# Patient Record
Sex: Female | Born: 1955 | Race: White | Hispanic: No | Marital: Single | Smoking: Never smoker
Health system: Southern US, Community
[De-identification: ages and names within clinical notes are randomized; demographics above are authoritative.]

## PROBLEM LIST (undated history)

## (undated) DIAGNOSIS — C4490 Unspecified malignant neoplasm of skin, unspecified: Secondary | ICD-10-CM

## (undated) DIAGNOSIS — G4700 Insomnia, unspecified: Secondary | ICD-10-CM

## (undated) DIAGNOSIS — K5792 Diverticulitis of intestine, part unspecified, without perforation or abscess without bleeding: Secondary | ICD-10-CM

## (undated) DIAGNOSIS — K219 Gastro-esophageal reflux disease without esophagitis: Secondary | ICD-10-CM

## (undated) DIAGNOSIS — J45909 Unspecified asthma, uncomplicated: Secondary | ICD-10-CM

## (undated) DIAGNOSIS — M1990 Unspecified osteoarthritis, unspecified site: Secondary | ICD-10-CM

## (undated) DIAGNOSIS — E7800 Pure hypercholesterolemia, unspecified: Secondary | ICD-10-CM

## (undated) DIAGNOSIS — R0600 Dyspnea, unspecified: Secondary | ICD-10-CM

## (undated) DIAGNOSIS — F32A Depression, unspecified: Secondary | ICD-10-CM

## (undated) DIAGNOSIS — R7303 Prediabetes: Secondary | ICD-10-CM

## (undated) DIAGNOSIS — E119 Type 2 diabetes mellitus without complications: Secondary | ICD-10-CM

## (undated) DIAGNOSIS — I10 Essential (primary) hypertension: Secondary | ICD-10-CM

## (undated) HISTORY — DX: Essential (primary) hypertension: I10

## (undated) HISTORY — PX: ABDOMINAL HYSTERECTOMY: SHX81

## (undated) HISTORY — PX: VESICOVAGINAL FISTULA CLOSURE W/ TAH: SUR271

## (undated) HISTORY — DX: Type 2 diabetes mellitus without complications: E11.9

## (undated) HISTORY — DX: Insomnia, unspecified: G47.00

## (undated) HISTORY — PX: CHOLECYSTECTOMY: SHX55

## (undated) HISTORY — DX: Unspecified malignant neoplasm of skin, unspecified: C44.90

---
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
## 1983-08-09 HISTORY — PX: ABDOMINAL HYSTERECTOMY: SHX81

## 1996-08-08 HISTORY — PX: APPENDECTOMY: SHX54

## 1996-08-08 HISTORY — PX: CHOLECYSTECTOMY: SHX55

## 1997-12-05 ENCOUNTER — Ambulatory Visit (HOSPITAL_COMMUNITY): Admission: RE | Admit: 1997-12-05 | Discharge: 1997-12-05 | Payer: Self-pay | Admitting: Obstetrics and Gynecology

## 1998-02-10 ENCOUNTER — Inpatient Hospital Stay (HOSPITAL_COMMUNITY): Admission: RE | Admit: 1998-02-10 | Discharge: 1998-02-12 | Payer: Self-pay | Admitting: Obstetrics and Gynecology

## 1998-08-08 HISTORY — PX: BREAST REDUCTION SURGERY: SHX8

## 1998-12-18 ENCOUNTER — Ambulatory Visit (HOSPITAL_COMMUNITY): Admission: RE | Admit: 1998-12-18 | Discharge: 1998-12-18 | Payer: Self-pay | Admitting: *Deleted

## 1999-10-03 ENCOUNTER — Encounter: Payer: Self-pay | Admitting: Emergency Medicine

## 1999-10-03 ENCOUNTER — Emergency Department (HOSPITAL_COMMUNITY): Admission: EM | Admit: 1999-10-03 | Discharge: 1999-10-03 | Payer: Self-pay | Admitting: Emergency Medicine

## 2001-09-20 ENCOUNTER — Encounter: Admission: RE | Admit: 2001-09-20 | Discharge: 2001-09-20 | Payer: Self-pay | Admitting: Dermatology

## 2001-09-20 ENCOUNTER — Encounter: Payer: Self-pay | Admitting: Dermatology

## 2001-09-20 ENCOUNTER — Other Ambulatory Visit: Admission: RE | Admit: 2001-09-20 | Discharge: 2001-09-20 | Payer: Self-pay | Admitting: Obstetrics and Gynecology

## 2002-02-02 ENCOUNTER — Encounter: Payer: Self-pay | Admitting: Emergency Medicine

## 2002-02-02 ENCOUNTER — Observation Stay (HOSPITAL_COMMUNITY): Admission: EM | Admit: 2002-02-02 | Discharge: 2002-02-03 | Payer: Self-pay | Admitting: Emergency Medicine

## 2002-02-02 ENCOUNTER — Encounter (INDEPENDENT_AMBULATORY_CARE_PROVIDER_SITE_OTHER): Payer: Self-pay | Admitting: Specialist

## 2002-10-11 ENCOUNTER — Other Ambulatory Visit: Admission: RE | Admit: 2002-10-11 | Discharge: 2002-10-11 | Payer: Self-pay | Admitting: Obstetrics and Gynecology

## 2003-11-18 ENCOUNTER — Other Ambulatory Visit: Admission: RE | Admit: 2003-11-18 | Discharge: 2003-11-18 | Payer: Self-pay | Admitting: Obstetrics and Gynecology

## 2003-11-21 ENCOUNTER — Emergency Department (HOSPITAL_COMMUNITY): Admission: EM | Admit: 2003-11-21 | Discharge: 2003-11-21 | Payer: Self-pay | Admitting: Emergency Medicine

## 2004-06-23 ENCOUNTER — Ambulatory Visit: Payer: Self-pay | Admitting: Gastroenterology

## 2004-07-12 ENCOUNTER — Ambulatory Visit: Payer: Self-pay | Admitting: Gastroenterology

## 2004-08-10 ENCOUNTER — Ambulatory Visit: Payer: Self-pay | Admitting: Gastroenterology

## 2004-09-21 ENCOUNTER — Ambulatory Visit: Payer: Self-pay | Admitting: Gastroenterology

## 2005-02-03 ENCOUNTER — Encounter: Admission: RE | Admit: 2005-02-03 | Discharge: 2005-02-03 | Payer: Self-pay | Admitting: Unknown Physician Specialty

## 2005-03-10 ENCOUNTER — Ambulatory Visit: Payer: Self-pay | Admitting: Gastroenterology

## 2005-05-09 ENCOUNTER — Inpatient Hospital Stay (HOSPITAL_COMMUNITY): Admission: RE | Admit: 2005-05-09 | Discharge: 2005-05-10 | Payer: Self-pay | Admitting: Neurosurgery

## 2007-05-09 ENCOUNTER — Ambulatory Visit (HOSPITAL_COMMUNITY): Admission: RE | Admit: 2007-05-09 | Discharge: 2007-05-09 | Payer: Self-pay | Admitting: Gastroenterology

## 2007-05-11 ENCOUNTER — Ambulatory Visit (HOSPITAL_COMMUNITY): Admission: RE | Admit: 2007-05-11 | Discharge: 2007-05-11 | Payer: Self-pay | Admitting: Gastroenterology

## 2007-07-27 ENCOUNTER — Encounter (INDEPENDENT_AMBULATORY_CARE_PROVIDER_SITE_OTHER): Payer: Self-pay | Admitting: General Surgery

## 2007-07-27 ENCOUNTER — Ambulatory Visit (HOSPITAL_COMMUNITY): Admission: RE | Admit: 2007-07-27 | Discharge: 2007-07-28 | Payer: Self-pay | Admitting: General Surgery

## 2009-08-19 ENCOUNTER — Emergency Department (HOSPITAL_COMMUNITY): Admission: EM | Admit: 2009-08-19 | Discharge: 2009-08-20 | Payer: Self-pay | Admitting: Emergency Medicine

## 2010-10-24 LAB — URINALYSIS, ROUTINE W REFLEX MICROSCOPIC
Ketones, ur: NEGATIVE mg/dL
Nitrite: NEGATIVE
Urobilinogen, UA: 0.2 mg/dL (ref 0.0–1.0)

## 2010-10-24 LAB — POCT I-STAT, CHEM 8
Calcium, Ion: 1.16 mmol/L (ref 1.12–1.32)
Chloride: 106 mEq/L (ref 96–112)
Potassium: 3.5 mEq/L (ref 3.5–5.1)
Sodium: 136 mEq/L (ref 135–145)
TCO2: 23 mmol/L (ref 0–100)

## 2010-10-24 LAB — DIFFERENTIAL
Basophils Absolute: 0 10*3/uL (ref 0.0–0.1)
Eosinophils Absolute: 0.1 10*3/uL (ref 0.0–0.7)
Lymphs Abs: 2.3 10*3/uL (ref 0.7–4.0)
Monocytes Absolute: 1 10*3/uL (ref 0.1–1.0)
Monocytes Relative: 9 % (ref 3–12)
Neutro Abs: 7.5 10*3/uL (ref 1.7–7.7)

## 2010-10-24 LAB — CBC: RBC: 4.58 MIL/uL (ref 3.87–5.11)

## 2010-12-21 NOTE — Op Note (Signed)
[name], Traci Hudson       ACCOUNT NO.:  [id number]   MEDICAL RECORD NO.:  [id number]          PATIENT TYPE:  OIB   LOCATION:  1335                         FACILITY:  [facility]   PHYSICIAN:  Adolph Pollack, M.D.DATE OF BIRTH:  25-Apr-1956   DATE OF PROCEDURE:  07/27/2007  DATE OF DISCHARGE:                               OPERATIVE REPORT   PREOPERATIVE DIAGNOSIS:  Biliary dyskinesia.   POSTOPERATIVE DIAGNOSIS:  Biliary dyskinesia.   PROCEDURE:  Laparoscopic cholecystectomy with intraoperative  cholangiogram.   SURGEON:  Adolph Pollack, M.D.   ANESTHESIA:  General.   ASSISTANT:  Lorne Skeens. Hoxworth, M.D.   INDICATIONS:  This is a 55 year old female who has been having some  nausea, bloating, and reflux after eating.  She has been treated  aggressively for this has had had an extensive evaluation.  She has been  noted to have biliary dyskinesia.  She has tried some medical treatment  with respect to anti-reflux medications and continues to have symptoms.  We discussed laparoscopic cholecystectomy for this and she now presents  for that.   TECHNIQUE:  She was seen in the holding area and then brought to the  operating room, placed supine on the operating table, and a general  anesthetic was administered.  Her abdominal wall was sterilely prepped  and draped.  Marcaine solution was infiltrated in the subumbilical  region.  A small subumbilical incision was made through the skin and  subcutaneous tissue.  An incision was made in the anterior and posterior  fascial layers and the peritoneal cavity was entered under direct  vision.  A pursestring suture of 0 Vicryl was placed around the fascial  edges.  A Hassan trocar was introduced into the peritoneal cavity and  pneumoperitoneum created by insufflation of CO2 gas.   Following this, a laparoscope was introduced.  No significant hepatic  abnormalities were noted.  She was placed in reversed Trendelenburg  position  with the right side tilted slightly up.  An 11 mm trocar was  placed through an epigastric incision and two 5 mm trocars were placed  in the right upper quadrant.  The fundus of the gallbladder was grasped  and adhesions between the omentum and body and infundibulum of the  gallbladder were noted and mobilized bluntly.  The infundibulum was  grasped and mobilized using blunt dissection.  It was retracted  laterally.  I identified the cystic duct and created a window around it.  Just adjacent to it was the cystic artery and I created a window around  the artery and clipped and divided it.  A clip was placed at the cystic  duct gallbladder junction.  A small incision was made into the cystic  duct.  A cholangiocatheter was passed through the anterior abdominal  wall and placed into the cystic duct and a cholangiogram was performed.   Under real time fluoroscopy, dilute contrast was injected into the  cystic duct which was of moderate length.  The right and left hepatic  ducts as well as the common hepatic duct and bile duct all opacified  promptly.  Contrast drained promptly into the duodenum without obvious  evidence of obstruction.  Final report is pending the radiologist  interpretation.   Following this, the cholangiocatheter was removed, the cystic duct was  clipped three times proximally and divided.  The gallbladder dissected  free from the liver using electrocautery and placed in Endopouch bag.  The gallbladder fossa was copiously irrigated and bleeding points were  controlled with electrocautery.  Further inspection demonstrated no be  bleeding or bile leak.  The gallbladder was removed through the  subumbilical port.  The subumbilical fascial defect was closed under  laparoscopic vision by tightening up and tying down the pursestring  suture.  The remaining irrigation fluid was evacuated as much as  possible and the rest of the trocars were removed and the  pneumoperitoneum was  released.   The skin incisions were then closed using 4-0 Monocryl subcuticular  stitches followed by Steri-Strips and sterile dressings.  She tolerated  the procedure without any apparent complications and was taken to the  recovery room in satisfactory condition.      Adolph Pollack, M.D.  Electronically Signed     TJR/MEDQ  D:  07/27/2007  T:  07/28/2007  Job:  [id number]   cc:   Anselmo Rod, M.D.  Fax: [phone]   Edwena Felty. Romine, M.D.  Fax: [phone]

## 2010-12-24 NOTE — Op Note (Signed)
NAMETONILYNN, BIEKER              ACCOUNT NO.:  [id number]   MEDICAL RECORD NO.:  [id number]          PATIENT TYPE:  INP   LOCATION:  2859                         FACILITY:  [facility]   PHYSICIAN:  Clydene Fake, M.D.  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   POSTOPERATIVE DIAGNOSIS:  Spondylosis with left-sided radiculopathy C4-5, 5-  6 and 6-7.   PROCEDURE:  Anterior cervical decompression, discectomy and fusion at C4-5,  5-6 and 6-7 with LifeNet allograft bone, Eagle anterior cervical plate.   SURGEON:  Clydene Fake, M.D.   ASSISTANT:  Hilda Lias, M.D.   ANESTHESIA:  General endotracheal tube anesthesia .   ESTIMATED BLOOD LOSS:  Minimal.   BLOOD REPLACED:  None.   DRAINS:  None.   COMPLICATIONS:  None.   INDICATIONS FOR PROCEDURE:  Patient is a 55 year old woman who was having  neck, left shoulder and arm pain and numbness with spondylitic changes on  MRI at three levels, worse at the left side from kneeling.  Patient brought  in for decompression and fusion.   DESCRIPTION OF PROCEDURE:  Patient was brought to the operating room.  General anesthesia induced.  Patient placed in 10 pound Halter traction,  prepped and draped in sterile fashion.  Site of incision was injected with  10 mL of 1% lidocaine with epinephrine.  Incision was then made from the  midline.  The anterior border of the sternocleidomastoid muscle on the left  side of the neck incision taken down to the platysma and hemostasis obtained  with Bovie cauterization.  Platysma was incised with the Bovie and blunt  dissection was taken through the anterior cervical fascia of the anterior  cervical spine.  The needle was placed in the interspace.  X-ray was  obtained showing this was the 5-6 interspace.  This interspace was incised  with  a 15 blade and partial discectomy was performed.  The needle was  removed.  The longus colli muscles were reflected laterally from C4-7  bilaterally and self-retaining retractor was placed so we could see the 4-5  and 5-6 interspaces.  Both interspaces were incised with the 15 blade and  discectomy was started with pituitary rongeurs.  Distraction pins were  placed in the C4 and C6 and interspaces distracted.  Microscope was brought  in for microdissection  Discectomy in 4-5 was continued with the pituitary  rongeurs and curettes and then 1 and 2 mm Kerrison punches were used to  remove posterior ligament, positive osteophytes, decompressing the central  canal and then performing bilateral foraminotomies.  When we were finished,  we had good central and lateral decompression.  Nerve roots to both sides  were decompressed.  We measured the height of the interspace to be 5 mm and  we had hemostasis with Gelfoam and thrombin.  Attention was then taken to  the 5-6 level.  This was very spondylitic.  The __________ spurs were bit  off  with [employer].  Disc space did not open up well and high speed  drill was used to drill down through the interspace removing her last end  plate and disc space.  We drilled down leaving a thin layer of posterior  ligament and osteophyte.  A 2 mm Kerrison punch was then used to remove this  decompression down to the dura centrally and then perform bilateral  foraminotomies, decompressing the nerve roots to both sides.  When we were  finished, we had good decompression of the central canal and the bilateral  C6 nerve roots.  Interspace was distracted with the interspace distractor  while we were doing the decompression.  We measured the height of the disc  space to be 4 mm and got hemostasis with Gelfoam and thrombin form the 4-5  level and tapped the 5 mm  LifeNet bone in place, countersinking it a couple  of millimeters and checking posterior of the graft  and there was plenty of  room between the bone graft and dura.  We repeated this at the 5-6 level  with the 4 mm bone graft tapping it into place after the Gelfoam was  removed.  We irrigated with antibiotic solution.  There was good hemostasis  at these levels.  Distraction was removed and the distraction pin was  removed from C4 and placed in the C7.  Retractors were removed down so we  could see the 6-7 interspace and we incised the disc space and distracting  the interspace.  We started discectomy with pituitary rongeurs and curettes  drilled off the anterior osteophytes with the high speed drill and then  drilled through the interspace with the high speed drill.  We then continued  the discectomy with curettes, pituitary rongeurs and then used 1 and 2 mm  Kerrison punches to remove posterior disc, posterior ligament and posterior  osteophytes, decompressing the central canal and then performing bilateral  foraminotomies.  When we were finished, we could seen the roots coming out,  we had good decompression over the roots on each side.  Interspace was  distracted and we measured the height of the interspace to be 5 mm.  After  good hemostasis with Gelfoam and thrombin, this was irrigated out.  We  tapped a 5 mm bone graft into place, countersinking it a couple of  millimeters and checking posterior graft, insuring there was plenty of room  between bone graft and dura.  Distraction pins removed.  Hemostasis obtained  with Gelfoam and thrombin.  Weights removed from the traction.  We had good  position of all the bone grafts and were firmly in place.  Osteophytes were  trimmed with Leksell rongeurs and __________ anterior cervical plate was  placed over the anterior cervical spine and two screws placed in the C7, two  in C4 and two in C5 and 6 also.  These were all tightened down.  We checked  the bone grafts and they were firmly in place and tightened the screws. Took a lateral C-spine  x-ray which showed good position of the plate, screws  and interbody bone plugs at C4-5, 5-6 and 6-7.  Retractors removed.  Hemostasis obtained with Gelfoam, thrombin and bipolar cauterization.  Gelfoam was irrigated out.  We had very good hemostasis and the platysma was  closed with 3-0 Vicryl interrupted sutures.  The subcutaneous tissue was  closed with the same.  Skin was closed with benzoin and Steri-Strips.  Dressing was placed.  Patient was awoken  from anesthesia, extubated and  placed in a soft cervical collar and transferred to the recovery room in  stable condition.           ______________________________  Clydene Fake, M.D.     JRH/MEDQ  D:  05/09/2005  T:  05/09/2005  Job:  [id number]

## 2010-12-24 NOTE — Op Note (Signed)
[facility]  Patient:    Traci Hudson, Traci Hudson Visit Number: [id number] MRN: [id number]          Service Type: SUR Location: 4W [id number] [id number] Attending Physician:  Henrene Dodge Dictated by:   Anselm Pancoast. Zachery Dakins, M.D. Proc. Date: 02/02/02 Admit Date:  02/02/2002 Discharge Date: 02/03/2002                             Operative Report  PREOPERATIVE DIAGNOSIS:  Acute appendicitis.  POSTOPERATIVE DIAGNOSIS:  Acute appendicitis.  OPERATION:  Laparoscopic appendectomy.  ANESTHESIA:  General.  SURGEON:  Anselm Pancoast. Zachery Dakins, M.D.  ASSISTANT:  Sandria Bales. Ezzard Standing, M.D.  HISTORY:  The patient is a 55 year old Caucasian female who started having abdominal pain yesterday approximately midday. It progressed through the evening and this morning early a.m. she presented to the emergency room at [facility]. She was seen by the ER physician. White count was mildly elevated and she was definitely more tender in the lower abdomen. I was asked to see her after he had ordered a CT. Her urinalysis was negative and when I examined her, she had taken about two-thirds of the contrast and I was in agreement that this was probably an appendicitis. She gives a history that she has had problems with diverticulosis or actually has a lot of cramping and bloating symptoms and had a previous colonoscopy that showed diverticulosis. I thought it would be best to go ahead and proceed with the CT, which was performed and this showed inflammation around the cecum. We could not definitely see the appendix, but we could see what appeared to be a fecalith in the appendix. The patient preoperatively is given 3 g of Unasyn. Permission obtained for a laparoscopic appendectomy and the possibility of having to do it open if it was truly retrocecal was discussed with her.  PROCEDURE IN DETAIL:  The patient was taken to the operating suite. Induction of general anesthesia endotracheal  tube, oral tube into the stomach, Foley catheter inserted sterilely, and she received the antibiotic. The patient was draped in a sterile manner and then positioned so that I could make a small incision below the umbilicus, through the approximately two inches of adipose tissue, the fascia was identified and picked up between hemostats and a small opening carefully made into the peritoneal cavity. Traction sutures were placed and a Hasson cannula introduced. The camera was inserted and you could see a definite inflammatory process in the right lower quadrant. I placed a right upper 5 mm trocar under direct vision and then we kind of reflected the cecum down. You could see an obviously inflamed appendix, not truly retrocecal but kind of lying under the cecum but not retroperitoneally. The lateral 10 mm ______ trocar was placed at the left lower abdomen in direct vision and then with this, Dr. Ezzard Standing had scrubbed in. We were able to kind of free up the appendix, reflecting it upwards then using the harmonic scalpel, cauterized the appendiceal mesentery and taken it back down to the base. The appendix itself was inflamed, pretty close to the actual junction of the cecum but there was about a half-inch little area of normal base of the appendix. This was freed up so I could take a [facility] Endo clamp and then fired it so that the appendix at its junction with the cecum. I then placed the inflamed appendix in an Endocatch bag and then brought it out through  the umbilical port. The umbilical port was replaced and then we inspected the suture line with good hemostasis. I also removed the lower 10 mm port on direct vision, aspirated the irrigating fluid, and then removed the upper 5 mm port. The gas was released and then the pursestring suture at the umbilicus was tied with direct vision. I anesthetized the fascia at the umbilicus and then closed the subcutaneous tissue with 4-0 Vicryl, and Benzoin and  Steri-Strips on the skin. The patient tolerated the procedure nicely. She will be able to resume her diet this afternoon, then she will ready for discharge in the a.m. Dictated by:   Anselm Pancoast. Zachery Dakins, M.D. Attending Physician:  Henrene Dodge DD:  02/02/02 TD:  02/04/02 Job: [id number]

## 2011-05-13 LAB — COMPREHENSIVE METABOLIC PANEL
ALT: 25
Albumin: 3.8
Chloride: 104
GFR calc Af Amer: >60
GFR calc non Af Amer: >60
Potassium: 3.9
Total Bilirubin: 0.8

## 2011-05-13 LAB — CBC
HCT: 39.1
Hemoglobin: 13.3
MCHC: 34
MCV: 78.8
Platelets: 324
RBC: 4.97
WBC: 7.1

## 2011-05-13 LAB — DIFFERENTIAL
Basophils Absolute: 0
Basophils Relative: 0
Lymphocytes Relative: 30
Monocytes Relative: 6
Neutrophils Relative %: 62

## 2012-06-13 ENCOUNTER — Inpatient Hospital Stay (HOSPITAL_COMMUNITY): Admission: RE | Admit: 2012-06-13 | Payer: Self-pay | Source: Ambulatory Visit | Admitting: Orthopedic Surgery

## 2012-06-13 ENCOUNTER — Encounter (HOSPITAL_COMMUNITY): Admission: RE | Payer: Self-pay | Source: Ambulatory Visit

## 2012-06-13 SURGERY — ARTHROPLASTY, KNEE, TOTAL
Anesthesia: General | Site: Knee | Laterality: Left

## 2012-08-02 ENCOUNTER — Emergency Department (HOSPITAL_COMMUNITY)
Admission: EM | Admit: 2012-08-02 | Discharge: 2012-08-02 | Disposition: A | Discharge status: Home / Self Care | Payer: 59 | Attending: Emergency Medicine | Admitting: Emergency Medicine

## 2012-08-02 ENCOUNTER — Encounter (HOSPITAL_COMMUNITY): Payer: Self-pay | Admitting: *Deleted

## 2012-08-02 DIAGNOSIS — E7800 Pure hypercholesterolemia, unspecified: Secondary | ICD-10-CM | POA: Insufficient documentation

## 2012-08-02 DIAGNOSIS — R05 Cough: Secondary | ICD-10-CM | POA: Insufficient documentation

## 2012-08-02 DIAGNOSIS — B9789 Other viral agents as the cause of diseases classified elsewhere: Secondary | ICD-10-CM | POA: Insufficient documentation

## 2012-08-02 DIAGNOSIS — R059 Cough, unspecified: Secondary | ICD-10-CM | POA: Insufficient documentation

## 2012-08-02 DIAGNOSIS — B349 Viral infection, unspecified: Secondary | ICD-10-CM

## 2012-08-02 DIAGNOSIS — J029 Acute pharyngitis, unspecified: Secondary | ICD-10-CM | POA: Insufficient documentation

## 2012-08-02 DIAGNOSIS — R52 Pain, unspecified: Secondary | ICD-10-CM | POA: Insufficient documentation

## 2012-08-02 DIAGNOSIS — F17200 Nicotine dependence, unspecified, uncomplicated: Secondary | ICD-10-CM | POA: Insufficient documentation

## 2012-08-02 HISTORY — DX: Pure hypercholesterolemia, unspecified: E78.00

## 2012-08-02 LAB — COMPREHENSIVE METABOLIC PANEL
ALT: 17 U/L (ref 0–35)
AST: 24 U/L (ref 0–37)
BUN: 6 mg/dL (ref 6–23)
Calcium: 8.9 mg/dL (ref 8.4–10.5)
Creatinine, Ser: 0.74 mg/dL (ref 0.50–1.10)
GFR calc non Af Amer: >90 mL/min (ref >90)
Sodium: 137 mEq/L (ref 135–145)

## 2012-08-02 LAB — CBC WITH DIFFERENTIAL/PLATELET
Basophils Absolute: 0.1 10*3/uL (ref 0.0–0.1)
Basophils Relative: 1 % (ref 0–1)
Eosinophils Absolute: 0.2 10*3/uL (ref 0.0–0.7)
Eosinophils Relative: 3 % (ref 0–5)
HCT: 34.7 % — ABNORMAL LOW (ref 36.0–46.0)
Hemoglobin: 12 g/dL (ref 12.0–15.0)
MCV: 81.5 fL (ref 78.0–100.0)
Monocytes Relative: 8 % (ref 3–12)
Neutrophils Relative %: 67 % (ref 43–77)
RBC: 4.26 MIL/uL (ref 3.87–5.11)
WBC: 6 10*3/uL (ref 4.0–10.5)

## 2012-08-02 MED ORDER — OSELTAMIVIR PHOSPHATE 75 MG PO CAPS: 75.0000 mg | ORAL_CAPSULE | Freq: Two times a day (BID) | ORAL | Status: DC

## 2012-08-02 MED ORDER — HYDROCODONE-ACETAMINOPHEN 7.5-500 MG/15ML PO SOLN: 7.5000 mL | Freq: Four times a day (QID) | ORAL | Status: DC | PRN

## 2012-08-02 MED ORDER — OSELTAMIVIR PHOSPHATE 75 MG PO CAPS
75.0000 mg | ORAL_CAPSULE | Freq: Once | ORAL | Status: AC
  Administered 2012-08-02: 75 mg via ORAL
  Filled 2012-08-02: qty 1

## 2012-08-02 MED ORDER — HYDROCODONE-ACETAMINOPHEN 5-325 MG PO TABS
2.0000 | ORAL_TABLET | Freq: Once | ORAL | Status: AC
  Administered 2012-08-02: 2 via ORAL
  Filled 2012-08-02: qty 2

## 2012-08-02 NOTE — ED Notes (Signed)
Up to b/r with assistance, friend at [facility], ambulatory with limp, back to room in w/c, no changes.

## 2012-08-02 NOTE — ED Notes (Signed)
EDP into room 

## 2012-08-02 NOTE — ED Notes (Signed)
Out to d/c desk in w/c, out with friend and RN, given Rx x2, no changes, alert, NAD, calm, interactive, resps e/u, speaking in clear complete sentences.

## 2012-08-02 NOTE — ED Provider Notes (Signed)
History     CSN: [id number]  Arrival date & time 08/02/12  0220   First MD Initiated Contact with Patient 08/02/12 [phone]      Chief Complaint  Patient presents with  . Fever  . Generalized Body Aches    (Consider location/radiation/quality/duration/timing/severity/associated sxs/prior treatment) HPI History provided by patient. Has been sick for the last few days with fevers and some dry cough, body aches and sore throat. Has history of sinusitis and her primary care physician started her on amoxicillin which is not helping. Fever over 101. Has been exposed to some sick contacts with similar symptoms. No rash. No neck stiffness. No headaches. Symptoms moderate in severity. No chest pain or difficulty breathing Past Medical History  Diagnosis Date  . Hypercholesteremia     Past Surgical History  Procedure Date  . Brain surgery 1969  . Cholecystectomy     No family history on file.  History  Substance Use Topics  . Smoking status: Current Every Day Smoker    Types: Cigarettes  . Smokeless tobacco: Not on file  . Alcohol Use: 1.2 oz/week    2 Glasses of wine per week    OB History    Grav Para Term Preterm Abortions TAB SAB Ect Mult Living                  Review of Systems  Constitutional: Positive for fever and chills.  HENT: Negative for neck pain and neck stiffness.   Eyes: Positive for pain. Negative for visual disturbance.  Respiratory: Negative for shortness of breath.   Cardiovascular: Negative for chest pain.  Gastrointestinal: Negative for abdominal pain.  Genitourinary: Negative for dysuria.  Musculoskeletal: Negative for back pain.  Skin: Negative for rash.  Neurological: Negative for headaches.  All other systems reviewed and are negative.    Allergies  Codeine  Home Medications  No current outpatient prescriptions on file.  BP 131/86  Pulse 84  Temp 100.6 F (38.1 C) (Oral)  Resp 16  SpO2 97%  Physical Exam  Constitutional: She is  oriented to person, place, and time. She appears well-developed and well-nourished.  HENT:  Head: Normocephalic and atraumatic.  Nose: Nose normal.  Mouth/Throat: Oropharynx is clear and moist. No oropharyngeal exudate.  Eyes: Conjunctivae normal and EOM are normal. Pupils are equal, round, and reactive to light.  Neck: Neck supple.  Cardiovascular: Normal rate, regular rhythm and intact distal pulses.   Pulmonary/Chest: Effort normal and breath sounds normal. No respiratory distress.  Abdominal: Soft. Bowel sounds are normal. She exhibits no distension. There is no tenderness.  Musculoskeletal: Normal range of motion. She exhibits no edema.  Neurological: She is alert and oriented to person, place, and time.  Skin: Skin is warm and dry.    ED Course  Procedures (including critical care time)  Results for orders placed during the hospital encounter of 08/02/12  CBC WITH DIFFERENTIAL      Component Value Range   WBC 6.0  4.0 - 10.5 K/uL   RBC 4.26  3.87 - 5.11 MIL/uL   Hemoglobin 12.0  12.0 - 15.0 g/dL   HCT 29.5 (*) 62.1 - 30.8 %   MCV 81.5  78.0 - 100.0 fL   MCH 28.2  26.0 - 34.0 pg   MCHC 34.6  30.0 - 36.0 g/dL   RDW 65.7  84.6 - 96.2 %   Platelets 195  150 - 400 K/uL   Neutrophils Relative 67  43 - 77 %  Neutro Abs 4.0  1.7 - 7.7 K/uL   Lymphocytes Relative 21  12 - 46 %   Lymphs Abs 1.3  0.7 - 4.0 K/uL   Monocytes Relative 8  3 - 12 %   Monocytes Absolute 0.5  0.1 - 1.0 K/uL   Eosinophils Relative 3  0 - 5 %   Eosinophils Absolute 0.2  0.0 - 0.7 K/uL   Basophils Relative 1  0 - 1 %   Basophils Absolute 0.1  0.0 - 0.1 K/uL  COMPREHENSIVE METABOLIC PANEL      Component Value Range   Sodium 137  135 - 145 mEq/L   Potassium 4.0  3.5 - 5.1 mEq/L   Chloride 102  96 - 112 mEq/L   CO2 26  19 - 32 mEq/L   Glucose, Bld 131 (*) 70 - 99 mg/dL   BUN 6  6 - 23 mg/dL   Creatinine, Ser 1.61  0.50 - 1.10 mg/dL   Calcium 8.9  8.4 - 09.6 mg/dL   Total Protein 6.7  6.0 - 8.3 g/dL    Albumin 3.6  3.5 - 5.2 g/dL   AST 24  0 - 37 U/L   ALT 17  0 - 35 U/L   Alkaline Phosphatase 67  39 - 117 U/L   Total Bilirubin 0.2 (*) 0.3 - 1.2 mg/dL   GFR calc non Af Amer >90  >90 mL/min   GFR calc Af Amer >90  >90 mL/min      MDM   Symptoms consistent with viral syndrome possible flu. Started on Tamiflu and plan followup primary care physician. Prescription for hydrocodone elixir provided. Reliable historian and agrees to strict return precautions. Vital signs and nursing notes reviewed.        Sunnie Nielsen, MD 08/02/12 [phone]

## 2012-08-02 NOTE — ED Notes (Signed)
Pt alert, NAD, calm, interactive, resps e/u, speaking in clear complete sentenences.  LS CTA, throat unremarkable.  C/o joint aching, fever (denies: nvd, sore throat, cough, congestion, cold sx, facial pain), recently tx'd for "sinus infection", still taking abx,  was told that her R ear looked worse than her L, but had L ear ache. Also chronic L knee pain exacerbated. See Dr. Charlann Boxer [place] [facility], has MRI on Tuesday.

## 2012-08-02 NOTE — ED Notes (Signed)
PT was tx with amoxicillin at pcp's office for sinus infection on Fri. Pt s/s improved, but Mon afternoon s/s returned (fever, body aches, sneezing, dry cough, sore throat).

## 2014-11-27 ENCOUNTER — Ambulatory Visit
Admission: RE | Admit: 2014-11-27 | Discharge: 2014-11-27 | Disposition: A | Discharge status: Home / Self Care | Payer: BLUE CROSS/BLUE SHIELD | Source: Ambulatory Visit | Attending: Family Medicine | Admitting: Family Medicine

## 2014-11-27 ENCOUNTER — Other Ambulatory Visit: Payer: Self-pay | Admitting: Family Medicine

## 2014-11-27 DIAGNOSIS — Z87898 Personal history of other specified conditions: Secondary | ICD-10-CM

## 2014-11-27 DIAGNOSIS — Z8709 Personal history of other diseases of the respiratory system: Secondary | ICD-10-CM

## 2015-01-30 ENCOUNTER — Ambulatory Visit (INDEPENDENT_AMBULATORY_CARE_PROVIDER_SITE_OTHER): Payer: BLUE CROSS/BLUE SHIELD | Admitting: Internal Medicine

## 2015-01-30 ENCOUNTER — Encounter (INDEPENDENT_AMBULATORY_CARE_PROVIDER_SITE_OTHER): Payer: Self-pay

## 2015-01-30 ENCOUNTER — Encounter: Payer: Self-pay | Admitting: Internal Medicine

## 2015-01-30 VITALS — BP 124/78 | HR 83 | Ht 65.0 in | Wt 161.4 lb

## 2015-01-30 DIAGNOSIS — R05 Cough: Secondary | ICD-10-CM

## 2015-01-30 DIAGNOSIS — J4520 Mild intermittent asthma, uncomplicated: Secondary | ICD-10-CM | POA: Diagnosis not present

## 2015-01-30 DIAGNOSIS — R911 Solitary pulmonary nodule: Secondary | ICD-10-CM

## 2015-01-30 DIAGNOSIS — R058 Other specified cough: Secondary | ICD-10-CM

## 2015-01-30 NOTE — Progress Notes (Signed)
   Subjective:    Patient ID: Traci Hudson, female    DOB: 03/21/1956,   MRN: [id number]  HPI  59 yowf never smoker onset of "allergies" onset age 59 = itching/ sneezing then intermittent  Cough/ wheeze x age 59 all symptoms worse typcially in  March / April  with flare of rhinitis and cough prior to trip to [place] in May 2016 with w/u with neg CTa rx with advair > improved some but still coughing so referred 01/30/2015 to pulmonary clinic by Darcus Austin.   01/30/2015 1st [facility] office visit/ Traci Hudson   Chief Complaint  Patient presents with  . Pulmonary Consult    Referred by Dr. Darcus Austin.  Pt c/o cough and SOB for the past 3 months. Cough is non prod and worse when drinking something cold or when she takes a deep breath.   breathing fine now on advair with rare need for alb with main concern am cough p stirring and then while working using voice but not noct at all/dry and raspy  No obvious day to day or daytime variabilty or assoc cp or chest tightness, subjective wheeze overt sinus or hb symptoms. No unusual exp hx or h/o childhood pna/ asthma or knowledge of premature birth.  Sleeping ok without nocturnal  or early am exacerbation  of respiratory  c/o's or need for noct saba. Also denies any obvious fluctuation of symptoms with weather or environmental changes or other aggravating or alleviating factors except as outlined above   Current Medications, Allergies, Complete Past Medical History, Past Surgical History, Family History, and Social History were reviewed in [employer] record.    Review of Systems  Constitutional: Negative for fever, chills and unexpected weight change.  HENT: Negative for congestion, dental problem, ear pain, nosebleeds, postnasal drip, rhinorrhea, sinus pressure, sneezing, sore throat, trouble swallowing and voice change.   Eyes: Negative for visual disturbance.  Respiratory: Positive for cough. Negative for choking and  shortness of breath.   Cardiovascular: Negative for chest pain and leg swelling.  Gastrointestinal: Negative for vomiting, abdominal pain and diarrhea.  Genitourinary: Negative for difficulty urinating.       Acid heartburn Indigestion  Musculoskeletal: Positive for arthralgias.  Skin: Negative for rash.  Neurological: Negative for tremors, syncope and headaches.  Hematological: Does not bruise/bleed easily.       Objective:   Physical Exam  amb wf nad  Wt Readings from Last 3 Encounters:  01/30/15 161 lb 6.4 oz (73.211 kg)    Vital signs reviewed   HEENT: nl dentition, turbinates, and orophanx with mild cobblestoning. Nl external ear canals without cough reflex   NECK :  without JVD/Nodes/TM/ nl carotid upstrokes bilaterally   LUNGS: no acc muscle use, clear to A and P bilaterally without cough on insp or exp maneuvers   CV:  RRR  no s3 or murmur or increase in P2, no edema   ABD:  soft and nontender with nl excursion in the supine position. No bruits or organomegaly, bowel sounds nl  MS:  warm without deformities, calf tenderness, cyanosis or clubbing  SKIN: warm and dry without lesions    NEURO:  alert, approp, no deficits   CTa 12/27/14 report reviewed 5 mm spn  / small HH         Assessment & Plan:

## 2015-01-30 NOTE — Patient Instructions (Addendum)
We will call you in a year to be sure you've had your follow up ct  Stop advair and clariton   If you need more than twice weekly albuterol try starting back advair and if not happy see below  Try zyrtec 20 mg one at bedtime / Pepcid 20 mg until no symptoms at all for at least a week  Omeprazole should 40 mg Take 30-60 min before first meal of the day until 100% better   GERD (REFLUX)  is an extremely common cause of respiratory symptoms just like yours , many times with no obvious heartburn at all.    It can be treated with medication, but also with lifestyle changes including elevation of the head of your bed (ideally with 6 inch  bed blocks),  Smoking cessation, avoidance of late meals, excessive alcohol, and avoid fatty foods, chocolate, peppermint, colas, red wine, and acidic juices such as orange juice.  NO MINT OR MENTHOL PRODUCTS SO NO COUGH DROPS  USE SUGARLESS CANDY INSTEAD (Jolley ranchers or Stover's or Life Savers) or even ice chips will also do - the key is to swallow to prevent all throat clearing. NO OIL BASED VITAMINS - use powdered substitutes.    If you are satisfied with your treatment plan,  let your doctor know and he/she can either refill your medications or you can return here when your prescription runs out.     If in any way you are not 100% satisfied,  please tell us.  If 100% better, tell your friends!  Pulmonary follow up is as needed   Late add: advised typo above = zyrtec 10

## 2015-01-31 ENCOUNTER — Encounter: Payer: Self-pay | Admitting: Internal Medicine

## 2015-01-31 DIAGNOSIS — R058 Other specified cough: Secondary | ICD-10-CM | POA: Insufficient documentation

## 2015-01-31 DIAGNOSIS — J4520 Mild intermittent asthma, uncomplicated: Secondary | ICD-10-CM | POA: Insufficient documentation

## 2015-01-31 DIAGNOSIS — R911 Solitary pulmonary nodule: Secondary | ICD-10-CM | POA: Insufficient documentation

## 2015-01-31 DIAGNOSIS — R05 Cough: Secondary | ICD-10-CM | POA: Insufficient documentation

## 2015-01-31 NOTE — Assessment & Plan Note (Addendum)
The most common causes of chronic cough in immunocompetent adults include the following: upper airway cough syndrome (UACS), previously referred to as postnasal drip syndrome (PNDS), which is caused by variety of rhinosinus conditions; (2) asthma; (3) GERD; (4) chronic bronchitis from cigarette smoking or other inhaled environmental irritants; (5) nonasthmatic eosinophilic bronchitis; and (6) bronchiectasis.   These conditions, singly or in combination, have accounted for up to 94% of the causes of chronic cough in prospective studies.   Other conditions have constituted no >6% of the causes in prospective studies These have included bronchogenic carcinoma, chronic interstitial pneumonia, sarcoidosis, left ventricular failure, ACEI-induced cough, and aspiration from a condition associated with pharyngeal dysfunction.    Chronic cough is often simultaneously caused by more than one condition. A single cause has been found from 38 to 82% of the time, multiple causes from 18 to 62%. Multiply caused cough has been the result of three diseases up to 42% of the time.       Based on hx and exam, this is most likely not cough variant asthma at present ( though she may be prone to it) but rather Classic Upper airway cough syndrome, so named because it's frequently impossible to sort out how much is  CR/sinusitis with freq throat clearing (which can be related to primary GERD)   vs  causing  secondary (" extra esophageal")  GERD from wide swings in gastric pressure that occur with throat clearing, often  promoting self use of mint and menthol lozenges that reduce the lower esophageal sphincter tone and exacerbate the problem further in a cyclical fashion.   These are the same pts (now being labeled as having "irritable larynx syndrome" by some cough centers) who not infrequently have a history of having failed to tolerate ace inhibitors,  dry powder inhalers or biphosphonates or report having atypical reflux  symptoms (note she has HH)  that don't respond to standard doses of PPI , and are easily confused as having aecopd or asthma flares by even experienced allergists/ pulmonologists.   The first step is to maximize acid suppression and eliminate dpi/ pnds then regroup if the cough persists.   I had an extended discussion with the patient reviewing all relevant studies completed to date and  lasting  40m Each maintenance medication was reviewed in detail including most importantly the difference between maintenance and as needed and under what circumstances the prns are to be used.  Please see instructions for details which were reviewed in writing and the patient given a copy.

## 2015-01-31 NOTE — Assessment & Plan Note (Signed)
CT chest may21 2016 [place]  5 mm RLL / never smoker but pos fm hx lung ca > tickle file for 01/2016 recall   CT results reviewed with pt >>> Too small for PET or bx, not suspicious enough for excisional bx > really only option for now is follow the Fleischner society guidelines as rec by radiology> she is very very low risk but since fm hx pos reasonable to do repeat  in a year

## 2015-01-31 NOTE — Assessment & Plan Note (Signed)
advair seems to have helped the breathing but not the cough which is typical of uacs (see sep a/p)  No evidence of chronic asthma so ok to try off advair > if develops seasonal asthma or fails the rule of two's (reviewed) would use symb 80 or dulera 100 in this setting as they don't cause as much cough and work much faster for the pt with acute seasonal flare patterns

## 2015-08-09 HISTORY — PX: VESICOVAGINAL FISTULA CLOSURE W/ TAH: SUR271

## 2015-12-22 ENCOUNTER — Telehealth: Payer: Self-pay | Admitting: Internal Medicine

## 2015-12-22 DIAGNOSIS — R911 Solitary pulmonary nodule: Secondary | ICD-10-CM

## 2015-12-22 NOTE — Telephone Encounter (Signed)
Spoke with pt and she would like to schedule CT for several days before her visit with MW to make sure that he has results. Advised pt that [facility]'s will call her to schedule CT and that I will ask for it to be done 3-4 days before appt on 01/26/16. Order placed. Nothing further needed.

## 2016-01-07 ENCOUNTER — Emergency Department (HOSPITAL_COMMUNITY)
Admission: EM | Admit: 2016-01-07 | Discharge: 2016-01-07 | Disposition: A | Discharge status: Home / Self Care | Payer: BLUE CROSS/BLUE SHIELD | Attending: Emergency Medicine | Admitting: Emergency Medicine

## 2016-01-07 ENCOUNTER — Encounter (HOSPITAL_COMMUNITY): Payer: Self-pay

## 2016-01-07 ENCOUNTER — Emergency Department (HOSPITAL_COMMUNITY): Payer: BLUE CROSS/BLUE SHIELD

## 2016-01-07 DIAGNOSIS — K5732 Diverticulitis of large intestine without perforation or abscess without bleeding: Secondary | ICD-10-CM | POA: Insufficient documentation

## 2016-01-07 DIAGNOSIS — I10 Essential (primary) hypertension: Secondary | ICD-10-CM | POA: Insufficient documentation

## 2016-01-07 DIAGNOSIS — R112 Nausea with vomiting, unspecified: Secondary | ICD-10-CM | POA: Diagnosis present

## 2016-01-07 DIAGNOSIS — Z7984 Long term (current) use of oral hypoglycemic drugs: Secondary | ICD-10-CM | POA: Diagnosis not present

## 2016-01-07 DIAGNOSIS — J45909 Unspecified asthma, uncomplicated: Secondary | ICD-10-CM | POA: Insufficient documentation

## 2016-01-07 DIAGNOSIS — E119 Type 2 diabetes mellitus without complications: Secondary | ICD-10-CM | POA: Diagnosis not present

## 2016-01-07 DIAGNOSIS — Z79899 Other long term (current) drug therapy: Secondary | ICD-10-CM | POA: Insufficient documentation

## 2016-01-07 DIAGNOSIS — Z85828 Personal history of other malignant neoplasm of skin: Secondary | ICD-10-CM | POA: Diagnosis not present

## 2016-01-07 HISTORY — DX: Diverticulitis of intestine, part unspecified, without perforation or abscess without bleeding: K57.92

## 2016-01-07 HISTORY — DX: Unspecified asthma, uncomplicated: J45.909

## 2016-01-07 LAB — COMPREHENSIVE METABOLIC PANEL
ALK PHOS: 67 U/L (ref 38–126)
ALT: 20 U/L (ref 14–54)
AST: 25 U/L (ref 15–41)
Albumin: 4.5 g/dL (ref 3.5–5.0)
Anion gap: 8 (ref 5–15)
BUN: 10 mg/dL (ref 6–20)
CALCIUM: 9.4 mg/dL (ref 8.9–10.3)
CO2: 27 mmol/L (ref 22–32)
CREATININE: 0.83 mg/dL (ref 0.44–1.00)
Chloride: 104 mmol/L (ref 101–111)
Glucose, Bld: 136 mg/dL — ABNORMAL HIGH (ref 65–99)
Potassium: 3.6 mmol/L (ref 3.5–5.1)
Sodium: 139 mmol/L (ref 135–145)
Total Bilirubin: 0.3 mg/dL (ref 0.3–1.2)
Total Protein: 7.5 g/dL (ref 6.5–8.1)

## 2016-01-07 LAB — CBC WITH DIFFERENTIAL/PLATELET
Basophils Absolute: 0 10*3/uL (ref 0.0–0.1)
Basophils Relative: 0 %
Eosinophils Absolute: 0 10*3/uL (ref 0.0–0.7)
Eosinophils Relative: 0 %
HCT: 44.8 % (ref 36.0–46.0)
HEMOGLOBIN: 15.4 g/dL — AB (ref 12.0–15.0)
LYMPHS PCT: 7 %
Lymphs Abs: 0.9 10*3/uL (ref 0.7–4.0)
MCH: 28.4 pg (ref 26.0–34.0)
MCHC: 34.4 g/dL (ref 30.0–36.0)
MCV: 82.7 fL (ref 78.0–100.0)
Monocytes Absolute: 0.4 10*3/uL (ref 0.1–1.0)
Monocytes Relative: 3 %
NEUTROS ABS: 12.5 10*3/uL — AB (ref 1.7–7.7)
NEUTROS PCT: 90 %
Platelets: 294 10*3/uL (ref 150–400)
RBC: 5.42 MIL/uL — AB (ref 3.87–5.11)
RDW: 13.6 % (ref 11.5–15.5)
WBC: 13.9 10*3/uL — AB (ref 4.0–10.5)

## 2016-01-07 LAB — URINALYSIS, ROUTINE W REFLEX MICROSCOPIC
BILIRUBIN URINE: NEGATIVE
Glucose, UA: NEGATIVE mg/dL
HGB URINE DIPSTICK: NEGATIVE
KETONES UR: NEGATIVE mg/dL
Leukocytes, UA: NEGATIVE
NITRITE: NEGATIVE
Protein, ur: NEGATIVE mg/dL
Specific Gravity, Urine: 1.015 (ref 1.005–1.030)
pH: 7.5 (ref 5.0–8.0)

## 2016-01-07 MED ORDER — ONDANSETRON HCL 4 MG/2ML IJ SOLN
4.0000 mg | Freq: Once | INTRAMUSCULAR | Status: AC
  Administered 2016-01-07: 4 mg via INTRAVENOUS
  Filled 2016-01-07: qty 2

## 2016-01-07 MED ORDER — METRONIDAZOLE 500 MG PO TABS: 500.0000 mg | ORAL_TABLET | Freq: Three times a day (TID) | ORAL | Status: DC

## 2016-01-07 MED ORDER — CIPROFLOXACIN HCL 500 MG PO TABS
500.0000 mg | ORAL_TABLET | Freq: Once | ORAL | Status: AC
  Administered 2016-01-07: 500 mg via ORAL
  Filled 2016-01-07: qty 1

## 2016-01-07 MED ORDER — ONDANSETRON 4 MG PO TBDP: 4.0000 mg | ORAL_TABLET | Freq: Three times a day (TID) | ORAL | Status: DC | PRN

## 2016-01-07 MED ORDER — MORPHINE SULFATE (PF) 4 MG/ML IV SOLN
4.0000 mg | Freq: Once | INTRAVENOUS | Status: AC
  Administered 2016-01-07: 4 mg via INTRAVENOUS
  Filled 2016-01-07: qty 1

## 2016-01-07 MED ORDER — HYDROCODONE-ACETAMINOPHEN 5-325 MG PO TABS: ORAL_TABLET | ORAL | Status: DC

## 2016-01-07 MED ORDER — IOPAMIDOL (ISOVUE-300) INJECTION 61%
100.0000 mL | Freq: Once | INTRAVENOUS | Status: AC | PRN
  Administered 2016-01-07: 100 mL via INTRAVENOUS

## 2016-01-07 MED ORDER — METRONIDAZOLE 500 MG PO TABS
500.0000 mg | ORAL_TABLET | Freq: Once | ORAL | Status: AC
  Administered 2016-01-07: 500 mg via ORAL
  Filled 2016-01-07: qty 1

## 2016-01-07 MED ORDER — DIATRIZOATE MEGLUMINE & SODIUM 66-10 % PO SOLN
15.0000 mL | Freq: Once | ORAL | Status: AC
  Administered 2016-01-07: 15 mL via ORAL

## 2016-01-07 MED ORDER — CIPROFLOXACIN HCL 500 MG PO TABS: 500.0000 mg | ORAL_TABLET | Freq: Two times a day (BID) | ORAL | Status: DC

## 2016-01-07 MED ORDER — SODIUM CHLORIDE 0.9 % IV BOLUS (SEPSIS)
1000.0000 mL | Freq: Once | INTRAVENOUS | Status: AC
  Administered 2016-01-07: 1000 mL via INTRAVENOUS

## 2016-01-07 NOTE — ED Notes (Signed)
Patient transported to CT 

## 2016-01-07 NOTE — ED Notes (Signed)
Bed: WA06 Expected date:  Expected time:  Means of arrival:  Comments: 23F/abd pain/hx of diverticulitis

## 2016-01-07 NOTE — ED Notes (Signed)
Per EMS- patient c/o abdominal pain and nausea this AM. Patient went to UC and was given Phenergan 12.5 mg IV and Nubain 5 mg IV prior to EMS arrival. Patient has a history of diverticulitis and asthma.

## 2016-01-07 NOTE — ED Provider Notes (Signed)
CSN: [id number]     Arrival date & time 01/07/16  1036 History   First MD Initiated Contact with Patient 01/07/16 1040     Chief Complaint  Patient presents with  . Abdominal Pain  . Nausea   (Consider location/radiation/quality/duration/timing/severity/associated sxs/prior Treatment) HPI Comments: Patient with history of diverticulitis, cholecystectomy, appendectomy -- presents with complaint of lower abdominal pain onset this morning. Also with associated nausea and vomiting. Patient has been unable to tolerate liquids. No chest pain or shortness of breath. No fevers. Normal bowel movement this morning without blood. No diarrhea. Patient states her symptoms are similar to previous episode of diverticulitis. Patient was seen at outside urgent care and was given Phenergan and Nubain prior to ED arrival. No other treatments. No other complaints. Onset of symptoms acute. Course is constant. Nothing makes symptoms better or worse.  Patient is a 60 y.o. female presenting with abdominal pain. The history is provided by the patient.  Abdominal Pain Associated symptoms: nausea and vomiting   Associated symptoms: no chest pain, no cough, no diarrhea, no dysuria, no fever and no sore throat     Past Medical History  Diagnosis Date  . Hypercholesteremia   . Skin cancer   . Insomnia   . DM (diabetes mellitus) ([place])   . Hypertension   . Diverticulitis   . Asthma    Past Surgical History  Procedure Laterality Date  . Brain surgery  1969  . Cholecystectomy    . Breast reduction surgery  2000  . Appendectomy  1998  . Vesicovaginal fistula closure w/ tah    . Abdominal hysterectomy     Family History  Problem Relation Age of Onset  . Emphysema Mother     smoked  . Lung cancer Mother     smoked   Social History  Substance Use Topics  . Smoking status: Never Smoker   . Smokeless tobacco: Never Used  . Alcohol Use: 1.2 oz/week    2 Glasses of wine per week     Comment: occasionally    OB History    No data available     Review of Systems  Constitutional: Negative for fever.  HENT: Negative for rhinorrhea and sore throat.   Eyes: Negative for redness.  Respiratory: Negative for cough.   Cardiovascular: Negative for chest pain.  Gastrointestinal: Positive for nausea, vomiting and abdominal pain. Negative for diarrhea.  Genitourinary: Negative for dysuria.  Musculoskeletal: Negative for myalgias.  Skin: Negative for rash.  Neurological: Negative for headaches.    Allergies  Codeine and Sulfa antibiotics  Home Medications   Prior to Admission medications   Medication Sig Start Date End Date Taking? Authorizing Provider  estradiol (ESTRACE) 0.5 MG tablet Take 0.5 mg by mouth daily.    Historical Provider, MD  loratadine (CLARITIN) 10 MG tablet Take 10 mg by mouth daily.    Historical Provider, MD  lovastatin (MEVACOR) 20 MG tablet Take 20 mg by mouth at bedtime.    Historical Provider, MD  metFORMIN (GLUCOPHAGE) 500 MG tablet Take 500 mg by mouth daily with breakfast.    Historical Provider, MD  omeprazole (PRILOSEC) 20 MG capsule Take 20 mg by mouth daily.    Historical Provider, MD  PROAIR HFA 108 (90 BASE) MCG/ACT inhaler Inhale 2 puffs into the lungs every 4 (four) hours as needed. 12/21/14   Historical Provider, MD  sertraline (ZOLOFT) 50 MG tablet Take 50 mg by mouth daily.    Historical Provider, MD  zolpidem Lorrin Mais)  5 MG tablet Take 5 mg by mouth at bedtime as needed for sleep.    Historical Provider, MD   BP 137/77 mmHg  Pulse 81  Temp(Src) 98.4 F (36.9 C) (Oral)  Resp 20  SpO2 95%   Physical Exam  Constitutional: She appears well-developed and well-nourished.  HENT:  Head: Normocephalic and atraumatic.  Eyes: Conjunctivae are normal. Right eye exhibits no discharge. Left eye exhibits no discharge.  Neck: Normal range of motion. Neck supple.  Cardiovascular: Normal rate, regular rhythm and normal heart sounds.   Pulmonary/Chest: Effort  normal and breath sounds normal.  Abdominal: Soft. She exhibits no distension. There is tenderness. There is no rebound and no guarding.  Patient with generalized abdominal tenderness, worst in suprapubic area and left lower quadrant, however pain does extend to the right side of the abdomen.  Neurological: She is alert.  Skin: Skin is warm and dry.  Psychiatric: She has a normal mood and affect.  Nursing note and vitals reviewed.   ED Course  Procedures (including critical care time) Labs Review Labs Reviewed  CBC WITH DIFFERENTIAL/PLATELET - Abnormal; Notable for the following:    WBC 13.9 (*)    RBC 5.42 (*)    Hemoglobin 15.4 (*)    Neutro Abs 12.5 (*)    All other components within normal limits  COMPREHENSIVE METABOLIC PANEL - Abnormal; Notable for the following:    Glucose, Bld 136 (*)    All other components within normal limits  URINALYSIS, ROUTINE W REFLEX MICROSCOPIC (NOT AT [facility]) - Abnormal; Notable for the following:    APPearance CLOUDY (*)    All other components within normal limits    Imaging Review Ct Abdomen Pelvis W Contrast  01/07/2016  CLINICAL DATA:  Abdominal pain and nausea beginning this morning. No known injury. Initial encounter. EXAM: CT ABDOMEN AND PELVIS WITH CONTRAST TECHNIQUE: Multidetector CT imaging of the abdomen and pelvis was performed using the standard protocol following bolus administration of intravenous contrast. CONTRAST:  100 ml ISOVUE-300 IOPAMIDOL (ISOVUE-300) INJECTION 61% COMPARISON:  CT abdomen and pelvis 08/20/2009. FINDINGS: There is some dependent atelectasis in the lung bases. No pleural or pericardial effusion. Small amount of contrast is seen in the distal esophagus compatible with either poor esophageal motility and/or reflux. Very small hiatal hernia is identified. The patient is status post cholecystectomy. The liver, spleen, pancreas, kidneys and adrenal glands appear normal. Extensive diverticular disease is identified. There is  marked stranding and wall thickening in the mid sigmoid colon consistent with superimposed acute diverticulitis. Associated small volume of free pelvic fluid and fluid in the right pericolic gutter is noted. No abscess or perforation is identified. The stomach and small bowel appear normal. The patient is status post hysterectomy. No lymphadenopathy is identified. No worrisome bony lesion is seen. Bilateral hip osteoarthritis is noted. IMPRESSION: Findings consistent with sigmoid diverticulitis without abscess or perforation. Associated small volume of free fluid in the pelvis and right pericolic gutter is noted. Small volume of contrast in the distal esophagus is compatible with the there esophageal dysmotility and/or gastroesophageal reflux. Very small hiatal hernia. Status post cholecystectomy and hysterectomy. Electronically Signed   By: Inge Rise M.D.   On: 01/07/2016 12:52   I have personally reviewed and evaluated these images and lab results as part of my medical decision-making.   EKG Interpretation   Date/Time:  Thursday January 07 2016 10:50:05 EDT Ventricular Rate:  77 PR Interval:  141 QRS Duration: 92 QT Interval:  362 QTC  Calculation: 410 R Axis:   92 Text Interpretation:  Sinus rhythm Right axis deviation Baseline wander in  lead(s) V4 No significant change since last tracing Confirmed by FLOYD MD,  DANIEL [phone]) on 01/07/2016 2:26:38 PM       10:55 AM Patient seen and examined. Work-up initiated. Medications ordered. Given diffuse pain, patient appearance, history of diverticulitis -- feel CT scan is needed to rule out complicated diverticulitis. Patient verbalizes understanding and agrees with plan.   Vital signs reviewed and are as follows: BP 137/77 mmHg  Pulse 81  Temp(Src) 98.4 F (36.9 C) (Oral)  Resp 20  SpO2 95%  2:46 PM patient updated on CT results. She is tolerating oral fluids and her oral antibiotics. Will discharge to home with Cipro, Flagyl, Vicodin,  Zofran. Patient encouraged to return to the emergency department with worsening pain, fever.  She is comfortable with discharge and wants to go home. Family and neighbor at bedside will keep an eye on patient while at home.   MDM   Final diagnoses:  Diverticulitis of large intestine without perforation or abscess without bleeding   Patient with uncomplicated diverticulitis. Symptoms controlled in emergency department. Mild leukocytosis. Feel that she is stable for discharge to home at this time. Patient to return with worsening symptoms, inability to take her medications.   Carlisle Cater, PA-C 01/07/16 [place], DO 01/07/16 1507

## 2016-01-07 NOTE — Discharge Instructions (Signed)
Please read and follow all provided instructions.  Your diagnoses today include:  1. Diverticulitis of large intestine without perforation or abscess without bleeding    Tests performed today include:  CT scan of your abdomen and pelvis - shows uncomplicated diverticulitis  Blood counts and electrolytes - elevated infection fighting cells  Vital signs. See below for your results today.   Medications prescribed:   Ciprofloxacin - antibiotic  You have been prescribed an antibiotic medicine: take the entire course of medicine even if you are feeling better. Stopping early can cause the antibiotic not to work.   Metronidazole - antibiotic  You have been prescribed an antibiotic medicine: take the entire course of medicine even if you are feeling better. Stopping early can cause the antibiotic not to work. Do not drink alcohol when taking this medication.    Vicodin (hydrocodone/acetaminophen) - narcotic pain medication  DO NOT drive or perform any activities that require you to be awake and alert because this medicine can make you drowsy. BE VERY CAREFUL not to take multiple medicines containing Tylenol (also called acetaminophen). Doing so can lead to an overdose which can damage your liver and cause liver failure and possibly death.   Zofran (ondansetron) - for nausea and vomiting  Take any prescribed medications only as directed.  Home care instructions:  Follow any educational materials contained in this packet.  BE VERY CAREFUL not to take multiple medicines containing Tylenol (also called acetaminophen). Doing so can lead to an overdose which can damage your liver and cause liver failure and possibly death.   Follow-up instructions: Please follow-up with your primary care provider in the next 3 days for further evaluation of your symptoms.   Return instructions:   Please return to the Emergency Department if you experience worsening symptoms.   Please return with  worsening abdominal pain, fever, vomiting.  Please return if you have any other emergent concerns.  Additional Information:  Your vital signs today were: BP 99/65 mmHg   Pulse 97   Temp(Src) 98.4 F (36.9 C) (Oral)   Resp 18   SpO2 93% If your blood pressure (BP) was elevated above 135/85 this visit, please have this repeated by your doctor within one month. --------------

## 2016-01-07 NOTE — ED Notes (Signed)
Traci Hudson/daughter   [phone]

## 2016-01-12 ENCOUNTER — Inpatient Hospital Stay (HOSPITAL_COMMUNITY)
Admission: EM | Admit: 2016-01-12 | Discharge: 2016-01-22 | DRG: 392 | Disposition: A | Discharge status: Home Health | Payer: BLUE CROSS/BLUE SHIELD | Attending: General Surgery | Admitting: General Surgery

## 2016-01-12 ENCOUNTER — Encounter (HOSPITAL_COMMUNITY): Payer: Self-pay

## 2016-01-12 DIAGNOSIS — B954 Other streptococcus as the cause of diseases classified elsewhere: Secondary | ICD-10-CM | POA: Diagnosis present

## 2016-01-12 DIAGNOSIS — I10 Essential (primary) hypertension: Secondary | ICD-10-CM | POA: Diagnosis present

## 2016-01-12 DIAGNOSIS — E7800 Pure hypercholesterolemia, unspecified: Secondary | ICD-10-CM | POA: Diagnosis present

## 2016-01-12 DIAGNOSIS — E119 Type 2 diabetes mellitus without complications: Secondary | ICD-10-CM | POA: Diagnosis present

## 2016-01-12 DIAGNOSIS — R1032 Left lower quadrant pain: Secondary | ICD-10-CM | POA: Diagnosis not present

## 2016-01-12 DIAGNOSIS — K56609 Unspecified intestinal obstruction, unspecified as to partial versus complete obstruction: Secondary | ICD-10-CM

## 2016-01-12 DIAGNOSIS — E871 Hypo-osmolality and hyponatremia: Secondary | ICD-10-CM | POA: Diagnosis present

## 2016-01-12 DIAGNOSIS — Z85828 Personal history of other malignant neoplasm of skin: Secondary | ICD-10-CM

## 2016-01-12 DIAGNOSIS — K5660 Unspecified intestinal obstruction: Secondary | ICD-10-CM | POA: Diagnosis present

## 2016-01-12 DIAGNOSIS — K5720 Diverticulitis of large intestine with perforation and abscess without bleeding: Secondary | ICD-10-CM | POA: Diagnosis not present

## 2016-01-12 DIAGNOSIS — E876 Hypokalemia: Secondary | ICD-10-CM | POA: Diagnosis present

## 2016-01-12 DIAGNOSIS — N739 Female pelvic inflammatory disease, unspecified: Secondary | ICD-10-CM | POA: Diagnosis present

## 2016-01-12 DIAGNOSIS — Z79899 Other long term (current) drug therapy: Secondary | ICD-10-CM

## 2016-01-12 DIAGNOSIS — G4700 Insomnia, unspecified: Secondary | ICD-10-CM | POA: Diagnosis present

## 2016-01-12 DIAGNOSIS — Z90710 Acquired absence of both cervix and uterus: Secondary | ICD-10-CM

## 2016-01-12 DIAGNOSIS — K5792 Diverticulitis of intestine, part unspecified, without perforation or abscess without bleeding: Secondary | ICD-10-CM

## 2016-01-12 DIAGNOSIS — J45909 Unspecified asthma, uncomplicated: Secondary | ICD-10-CM | POA: Diagnosis present

## 2016-01-12 DIAGNOSIS — M160 Bilateral primary osteoarthritis of hip: Secondary | ICD-10-CM | POA: Diagnosis present

## 2016-01-12 MED ORDER — SODIUM CHLORIDE 0.9 % IV SOLN
Freq: Once | INTRAVENOUS | Status: AC
  Administered 2016-01-13: via INTRAVENOUS

## 2016-01-12 NOTE — ED Notes (Signed)
Pt complains of a possible diverticulitis, has a hx of the same, pt has been vomiting

## 2016-01-12 NOTE — ED Provider Notes (Signed)
CSN: [id number]     Arrival date & time 01/12/16  2131 History  By signing my name below, I, [facility], attest that this documentation has been prepared under the direction and in the presence of Junius Creamer, NP. Electronically Signed: Virgel Bouquet, ED Scribe. 01/13/2016. 12:15 AM.   Chief Complaint  Patient presents with  . Diverticulitis    The history is provided by the patient. No language interpreter was used.   HPI Comments: Traci Hudson is a 60 y.o. female with an hx of diverticulitis, appendectomy, and cholecystectomy who presents to the Emergency Department complaining of constant, moderate, lower left abdominal pain onset 6 days ago. Pt states that she was evaluated for the same complaint 5 days ago by Carlisle Cater, PA-C where she was on the borderline for admission, discharged with antibiotics, and advised to follow-up with her GI specialist for outpatient treatment. She notes that she followed-up yesterday with her PCP who advised the pt to return to the ED if she had no improvement today. She reports associated nausea and vomiting x4 this evening. She has not been able to eat solid foods in 5 days but has been able to drink fluids until this evening when she vomited up her Ensure beverage. She has been taking Cipro and Flagyl without relief. Denies fever or any other symptoms currently  Past Medical History  Diagnosis Date  . Hypercholesteremia   . Skin cancer   . Insomnia   . DM (diabetes mellitus) ([place])   . Hypertension   . Diverticulitis   . Asthma    Past Surgical History  Procedure Laterality Date  . Brain surgery  1969  . Cholecystectomy    . Breast reduction surgery  2000  . Appendectomy  1998  . Vesicovaginal fistula closure w/ tah    . Abdominal hysterectomy     Family History  Problem Relation Age of Onset  . Emphysema Mother     smoked  . Lung cancer Mother     smoked   Social History  Substance Use Topics  . Smoking status: Never  Smoker   . Smokeless tobacco: Never Used  . Alcohol Use: 1.2 oz/week    2 Glasses of wine per week     Comment: occasionally   OB History    No data available     Review of Systems  Constitutional: Negative for fever.  Respiratory: Negative for cough and shortness of breath.   Gastrointestinal: Positive for nausea, vomiting, abdominal pain and diarrhea.  Genitourinary: Positive for decreased urine volume. Negative for dysuria.  All other systems reviewed and are negative.     Allergies  Codeine; Sulfa antibiotics; and Tape  Home Medications   Prior to Admission medications   Medication Sig Start Date End Date Taking? Authorizing Provider  ARNUITY ELLIPTA 200 MCG/ACT AEPB Inhale 1 puff into the lungs daily. 12/26/15  Yes Historical Provider, MD  ciprofloxacin (CIPRO) 500 MG tablet Take 1 tablet (500 mg total) by mouth 2 (two) times daily. 01/07/16  Yes Carlisle Cater, PA-C  fexofenadine (ALLEGRA) 180 MG tablet Take 180 mg by mouth daily.  11/17/15  Yes Historical Provider, MD  fluticasone (FLONASE) 50 MCG/ACT nasal spray Place 1 spray into both nostrils daily.  10/22/15  Yes Historical Provider, MD  metroNIDAZOLE (FLAGYL) 500 MG tablet Take 1 tablet (500 mg total) by mouth 3 (three) times daily. 01/07/16  Yes Carlisle Cater, PA-C  Multiple Vitamin (MULTIVITAMIN WITH MINERALS) TABS tablet Take 1 tablet by mouth  daily.   Yes Historical Provider, MD  omeprazole (PRILOSEC) 20 MG capsule Take 20 mg by mouth daily as needed (acid relux).    Yes Historical Provider, MD  ondansetron (ZOFRAN ODT) 4 MG disintegrating tablet Take 1 tablet (4 mg total) by mouth every 8 (eight) hours as needed for nausea or vomiting. 01/07/16  Yes Carlisle Cater, PA-C  sertraline (ZOLOFT) 100 MG tablet Take 50 mg by mouth daily. 11/09/15  Yes Historical Provider, MD  traMADol (ULTRAM) 50 MG tablet Take 100 mg by mouth every 6 (six) hours as needed for moderate pain or severe pain.   Yes Historical Provider, MD  zolpidem  (AMBIEN) 5 MG tablet Take 2.5 mg by mouth at bedtime as needed for sleep.    Yes Historical Provider, MD  naproxen sodium (ANAPROX) 220 MG tablet Take 220 mg by mouth 2 (two) times daily as needed (pain).    Historical Provider, MD  PROAIR HFA 108 (90 BASE) MCG/ACT inhaler Inhale 2 puffs into the lungs every 4 (four) hours as needed for wheezing or shortness of breath.  12/21/14   Historical Provider, MD  valACYclovir (VALTREX) 1000 MG tablet Take 1 g by mouth every 12 (twelve) hours as needed (outbreaks).  11/09/15   Historical Provider, MD   BP 139/85 mmHg  Pulse 82  Temp(Src) 98.1 F (36.7 C) (Oral)  Resp 16  Ht 5\' 5"  (1.651 m)  Wt 70.308 kg  BMI 25.79 kg/m2  SpO2 97% Physical Exam  Constitutional: She is oriented to person, place, and time. She appears well-developed and well-nourished. No distress.  HENT:  Head: Normocephalic and atraumatic.  Eyes: Conjunctivae are normal.  Neck: Normal range of motion.  Cardiovascular: Normal rate.   Pulmonary/Chest: Effort normal. No respiratory distress.  Abdominal: Soft. Bowel sounds are normal. She exhibits no distension. There is tenderness.  Musculoskeletal: Normal range of motion.  Neurological: She is alert and oriented to person, place, and time.  Skin: Skin is warm and dry.  Psychiatric: She has a normal mood and affect. Her behavior is normal.  Nursing note and vitals reviewed.   ED Course  Procedures (including critical care time)  DIAGNOSTIC STUDIES: Oxygen Saturation is 98% on RA, normal by my interpretation.    COORDINATION OF CARE: 11:31 PM Will order IV fluids and labs. Discussed treatment plan with pt at bedside and pt agreed to plan.   Labs Review Labs Reviewed  CBC WITH DIFFERENTIAL/PLATELET - Abnormal; Notable for the following:    WBC 12.3 (*)    Neutro Abs 9.7 (*)    All other components within normal limits  BASIC METABOLIC PANEL - Abnormal; Notable for the following:    Sodium 132 (*)    Potassium 3.2 (*)     Chloride 97 (*)    Glucose, Bld 127 (*)    Calcium 8.5 (*)    All other components within normal limits  CBC - Abnormal; Notable for the following:    WBC 12.8 (*)    HCT 35.9 (*)    All other components within normal limits  I-STAT CHEM 8, ED - Abnormal; Notable for the following:    Sodium 131 (*)    Potassium 3.0 (*)    Chloride 88 (*)    Glucose, Bld 121 (*)    All other components within normal limits  CBC  BASIC METABOLIC PANEL    Imaging Review Ct Abdomen Pelvis W Contrast  01/13/2016  CLINICAL DATA:  Left lower quadrant pain. History of diverticulitis.  EXAM: CT ABDOMEN AND PELVIS WITH CONTRAST TECHNIQUE: Multidetector CT imaging of the abdomen and pelvis was performed using the standard protocol following bolus administration of intravenous contrast. CONTRAST:  141mL ISOVUE-300 IOPAMIDOL (ISOVUE-300) INJECTION 61% COMPARISON:  01/07/2016 FINDINGS: Lower chest and abdominal wall: Trace pleural effusions, new. Bibasilar atelectasis. Hepatobiliary: No focal liver abnormality.Cholecystectomy with stable mild prominence of the biliary tree. Biliary labs were normal 01/07/2016. Pancreas: Unremarkable. Spleen: Unremarkable. Adrenals/Urinary Tract: Negative adrenals. No hydronephrosis or stone. Collapsed urinary bladder. Reproductive:Hysterectomy. Stomach/Bowel: Recent diverticulitis with multiple rim enhancing collections in the pelvis. The largest is in the rectovaginal recess and measures up to 7 cm. A cluster of small abscesses present ventral and inferior to the sigmoid colon, in total measuring 7 x 3 cm in axial span. Discrete tubular collection in the deep interloop spaces measuring 5 cm in length by 2 cm in thickness. These 3 measured areas likely do not communicate. Diverticular inflammation with colonic wall thickening is improved. There is new small bowel dilatation and fluid filling proximal to collapsed and avidly enhancing segments in the pelvis. No free pneumoperitoneum.  Appendectomy. Vascular/Lymphatic: No acute vascular abnormality. No mass or adenopathy. Musculoskeletal: No acute abnormalities. IMPRESSION: 1. Recent diverticulitis with multiple pockets of abscess in the pelvis. The largest is in the rectovaginal recess and measures 7 cm. 2. Small bowel obstruction secondary to #1. 3. Bibasilar atelectasis with trace right effusion. Electronically Signed   By: Monte Fantasia M.D.   On: 01/13/2016 04:32   Dg Abd Acute W/chest  01/13/2016  CLINICAL DATA:  Initial evaluation for acute nausea, vomiting for 1 week. EXAM: DG ABDOMEN ACUTE W/ 1V CHEST COMPARISON:  None. FINDINGS: Cardiac and mediastinal silhouettes are within normal limits. Elevation the right hemidiaphragm with associated right basilar atelectasis. Lungs are otherwise clear without focal infiltrate. No pulmonary edema. The possible small right pleural effusion. No pneumothorax. Multiple dilated gas-filled loops of small bowel seen scattered throughout the abdomen. Fluid levels on upright projection. Paucity of gas distally. Probable retained enteric contrast material within the colon. Finding suggestive of possible bowel obstruction. These loops measure up to 4.7 cm in diameter. No free air. No soft tissue mass or abnormal calcification. Cholecystectomy clips noted. No acute osseous abnormality. Degenerative changes about the hips and within the lower lumbar spine. IMPRESSION: 1. Multiple dilated loops of small bowel with associated fluid levels, suspicious for mechanical small bowel obstruction. 2. Elevation of the right hemidiaphragm with associated right basilar atelectasis. Probable small right pleural effusion. Electronically Signed   By: Jeannine Boga M.D.   On: 01/13/2016 01:02   I have personally reviewed and evaluated these images and lab results as part of my medical decision-making.   EKG Interpretation None      MDM   Final diagnoses:  SBO (small bowel obstruction) ([place])    I  personally performed the services described in this documentation, which was scribed in my presence. The recorded information has been reviewed and is accurate.   Junius Creamer, NP 01/13/16 2105  Shanon Rosser, MD 01/13/16 2255

## 2016-01-13 ENCOUNTER — Emergency Department (HOSPITAL_COMMUNITY): Payer: BLUE CROSS/BLUE SHIELD

## 2016-01-13 ENCOUNTER — Encounter (HOSPITAL_COMMUNITY): Payer: Self-pay

## 2016-01-13 DIAGNOSIS — Z79899 Other long term (current) drug therapy: Secondary | ICD-10-CM | POA: Diagnosis not present

## 2016-01-13 DIAGNOSIS — B954 Other streptococcus as the cause of diseases classified elsewhere: Secondary | ICD-10-CM | POA: Diagnosis present

## 2016-01-13 DIAGNOSIS — N739 Female pelvic inflammatory disease, unspecified: Secondary | ICD-10-CM | POA: Diagnosis present

## 2016-01-13 DIAGNOSIS — E119 Type 2 diabetes mellitus without complications: Secondary | ICD-10-CM | POA: Diagnosis present

## 2016-01-13 DIAGNOSIS — K5720 Diverticulitis of large intestine with perforation and abscess without bleeding: Secondary | ICD-10-CM | POA: Diagnosis present

## 2016-01-13 DIAGNOSIS — K56609 Unspecified intestinal obstruction, unspecified as to partial versus complete obstruction: Secondary | ICD-10-CM | POA: Diagnosis present

## 2016-01-13 DIAGNOSIS — E7800 Pure hypercholesterolemia, unspecified: Secondary | ICD-10-CM | POA: Diagnosis present

## 2016-01-13 DIAGNOSIS — E871 Hypo-osmolality and hyponatremia: Secondary | ICD-10-CM | POA: Diagnosis present

## 2016-01-13 DIAGNOSIS — R1032 Left lower quadrant pain: Secondary | ICD-10-CM | POA: Diagnosis present

## 2016-01-13 DIAGNOSIS — Z90710 Acquired absence of both cervix and uterus: Secondary | ICD-10-CM | POA: Diagnosis not present

## 2016-01-13 DIAGNOSIS — G4700 Insomnia, unspecified: Secondary | ICD-10-CM | POA: Diagnosis present

## 2016-01-13 DIAGNOSIS — J45909 Unspecified asthma, uncomplicated: Secondary | ICD-10-CM | POA: Diagnosis present

## 2016-01-13 DIAGNOSIS — E876 Hypokalemia: Secondary | ICD-10-CM | POA: Diagnosis present

## 2016-01-13 DIAGNOSIS — Z85828 Personal history of other malignant neoplasm of skin: Secondary | ICD-10-CM | POA: Diagnosis not present

## 2016-01-13 DIAGNOSIS — K5660 Unspecified intestinal obstruction: Secondary | ICD-10-CM | POA: Diagnosis present

## 2016-01-13 DIAGNOSIS — M160 Bilateral primary osteoarthritis of hip: Secondary | ICD-10-CM | POA: Diagnosis present

## 2016-01-13 DIAGNOSIS — I10 Essential (primary) hypertension: Secondary | ICD-10-CM | POA: Diagnosis present

## 2016-01-13 LAB — BASIC METABOLIC PANEL
ANION GAP: 9 (ref 5–15)
BUN: 8 mg/dL (ref 6–20)
CHLORIDE: 97 mmol/L — AB (ref 101–111)
CO2: 26 mmol/L (ref 22–32)
CREATININE: 0.55 mg/dL (ref 0.44–1.00)
Calcium: 8.5 mg/dL — ABNORMAL LOW (ref 8.9–10.3)
GFR calc non Af Amer: >60 mL/min (ref >60)
Glucose, Bld: 127 mg/dL — ABNORMAL HIGH (ref 65–99)
POTASSIUM: 3.2 mmol/L — AB (ref 3.5–5.1)
SODIUM: 132 mmol/L — AB (ref 135–145)

## 2016-01-13 LAB — CBC WITH DIFFERENTIAL/PLATELET
Basophils Absolute: 0 10*3/uL (ref 0.0–0.1)
Basophils Relative: 0 %
EOS PCT: 1 %
Eosinophils Absolute: 0.1 10*3/uL (ref 0.0–0.7)
HCT: 39.2 % (ref 36.0–46.0)
Hemoglobin: 13.6 g/dL (ref 12.0–15.0)
Lymphocytes Relative: 12 %
Lymphs Abs: 1.5 10*3/uL (ref 0.7–4.0)
MCH: 28.3 pg (ref 26.0–34.0)
MCHC: 34.7 g/dL (ref 30.0–36.0)
MCV: 81.5 fL (ref 78.0–100.0)
MONO ABS: 1 10*3/uL (ref 0.1–1.0)
MONOS PCT: 8 %
NEUTROS PCT: 79 %
Neutro Abs: 9.7 10*3/uL — ABNORMAL HIGH (ref 1.7–7.7)
PLATELETS: 324 10*3/uL (ref 150–400)
RBC: 4.81 MIL/uL (ref 3.87–5.11)
RDW: 13.4 % (ref 11.5–15.5)
WBC: 12.3 10*3/uL — AB (ref 4.0–10.5)

## 2016-01-13 LAB — CBC
HCT: 35.9 % — ABNORMAL LOW (ref 36.0–46.0)
Hemoglobin: 12.6 g/dL (ref 12.0–15.0)
MCH: 27.8 pg (ref 26.0–34.0)
MCHC: 35.1 g/dL (ref 30.0–36.0)
MCV: 79.2 fL (ref 78.0–100.0)
Platelets: 318 10*3/uL (ref 150–400)
RBC: 4.53 MIL/uL (ref 3.87–5.11)
RDW: 13.4 % (ref 11.5–15.5)
WBC: 12.8 10*3/uL — AB (ref 4.0–10.5)

## 2016-01-13 LAB — I-STAT CHEM 8, ED
BUN: 7 mg/dL (ref 6–20)
CHLORIDE: 88 mmol/L — AB (ref 101–111)
Calcium, Ion: 1.13 mmol/L (ref 1.13–1.30)
Creatinine, Ser: 0.6 mg/dL (ref 0.44–1.00)
GLUCOSE: 121 mg/dL — AB (ref 65–99)
HCT: 43 % (ref 36.0–46.0)
Hemoglobin: 14.6 g/dL (ref 12.0–15.0)
POTASSIUM: 3 mmol/L — AB (ref 3.5–5.1)
Sodium: 131 mmol/L — ABNORMAL LOW (ref 135–145)
TCO2: 30 mmol/L (ref 0–100)

## 2016-01-13 MED ORDER — ONDANSETRON HCL 4 MG/2ML IJ SOLN
4.0000 mg | Freq: Four times a day (QID) | INTRAMUSCULAR | Status: DC | PRN
  Administered 2016-01-13 – 2016-01-21 (×10): 4 mg via INTRAVENOUS
  Filled 2016-01-13 (×11): qty 2

## 2016-01-13 MED ORDER — MORPHINE SULFATE (PF) 4 MG/ML IV SOLN
4.0000 mg | Freq: Once | INTRAVENOUS | Status: AC
  Administered 2016-01-13: 4 mg via INTRAVENOUS
  Filled 2016-01-13: qty 1

## 2016-01-13 MED ORDER — ONDANSETRON HCL 4 MG/2ML IJ SOLN
4.0000 mg | Freq: Once | INTRAMUSCULAR | Status: AC
  Administered 2016-01-13: 4 mg via INTRAVENOUS
  Filled 2016-01-13: qty 2

## 2016-01-13 MED ORDER — PHENOL 1.4 % MT LIQD
1.0000 | OROMUCOSAL | Status: DC | PRN
  Administered 2016-01-13: 1 via OROMUCOSAL
  Filled 2016-01-13: qty 177

## 2016-01-13 MED ORDER — DIATRIZOATE MEGLUMINE & SODIUM 66-10 % PO SOLN
15.0000 mL | Freq: Once | ORAL | Status: AC
  Administered 2016-01-13: 15 mL via ORAL

## 2016-01-13 MED ORDER — LORAZEPAM 2 MG/ML IJ SOLN
1.0000 mg | Freq: Once | INTRAMUSCULAR | Status: AC
  Administered 2016-01-13: 1 mg via INTRAVENOUS
  Filled 2016-01-13: qty 1

## 2016-01-13 MED ORDER — BUDESONIDE 0.5 MG/2ML IN SUSP
0.5000 mg | Freq: Two times a day (BID) | RESPIRATORY_TRACT | Status: DC
  Administered 2016-01-13 – 2016-01-22 (×17): 0.5 mg via RESPIRATORY_TRACT
  Filled 2016-01-13 (×19): qty 2

## 2016-01-13 MED ORDER — IOPAMIDOL (ISOVUE-300) INJECTION 61%
100.0000 mL | Freq: Once | INTRAVENOUS | Status: AC | PRN
  Administered 2016-01-13: 100 mL via INTRAVENOUS

## 2016-01-13 MED ORDER — POTASSIUM CHLORIDE IN NACL 20-0.9 MEQ/L-% IV SOLN
INTRAVENOUS | Status: DC
  Administered 2016-01-13 – 2016-01-17 (×7): via INTRAVENOUS
  Administered 2016-01-18: 125 mL/h via INTRAVENOUS
  Administered 2016-01-18: 03:00:00 via INTRAVENOUS
  Administered 2016-01-19 (×2): 125 mL/h via INTRAVENOUS
  Administered 2016-01-19 – 2016-01-20 (×3): via INTRAVENOUS
  Filled 2016-01-13 (×25): qty 1000

## 2016-01-13 MED ORDER — ALBUTEROL SULFATE (2.5 MG/3ML) 0.083% IN NEBU: 3.0000 mL | INHALATION_SOLUTION | RESPIRATORY_TRACT | Status: DC | PRN

## 2016-01-13 MED ORDER — FAMOTIDINE IN NACL 20-0.9 MG/50ML-% IV SOLN
20.0000 mg | Freq: Two times a day (BID) | INTRAVENOUS | Status: DC
  Administered 2016-01-13 – 2016-01-21 (×17): 20 mg via INTRAVENOUS
  Filled 2016-01-13 (×17): qty 50

## 2016-01-13 MED ORDER — LIDOCAINE HCL 2 % EX GEL
1.0000 "application " | Freq: Once | CUTANEOUS | Status: AC
  Administered 2016-01-13: 1
  Filled 2016-01-13: qty 5

## 2016-01-13 MED ORDER — POTASSIUM CHLORIDE 10 MEQ/100ML IV SOLN
10.0000 meq | INTRAVENOUS | Status: AC
  Administered 2016-01-13 (×4): 10 meq via INTRAVENOUS
  Filled 2016-01-13 (×4): qty 100

## 2016-01-13 MED ORDER — FLUTICASONE FUROATE 200 MCG/ACT IN AEPB: 1.0000 | INHALATION_SPRAY | Freq: Every day | RESPIRATORY_TRACT | Status: DC

## 2016-01-13 MED ORDER — PIPERACILLIN-TAZOBACTAM 3.375 G IVPB
3.3750 g | Freq: Three times a day (TID) | INTRAVENOUS | Status: DC
  Administered 2016-01-13 – 2016-01-19 (×21): 3.375 g via INTRAVENOUS
  Filled 2016-01-13 (×31): qty 50

## 2016-01-13 MED ORDER — MORPHINE SULFATE (PF) 2 MG/ML IV SOLN
2.0000 mg | INTRAVENOUS | Status: DC | PRN
  Administered 2016-01-13 (×2): 4 mg via INTRAVENOUS
  Administered 2016-01-13: 2 mg via INTRAVENOUS
  Administered 2016-01-13 – 2016-01-15 (×9): 4 mg via INTRAVENOUS
  Administered 2016-01-15: 2 mg via INTRAVENOUS
  Administered 2016-01-15: 4 mg via INTRAVENOUS
  Administered 2016-01-16 (×2): 2 mg via INTRAVENOUS
  Administered 2016-01-16 – 2016-01-19 (×6): 4 mg via INTRAVENOUS
  Filled 2016-01-13 (×2): qty 2
  Filled 2016-01-13 (×2): qty 1
  Filled 2016-01-13 (×3): qty 2
  Filled 2016-01-13: qty 1
  Filled 2016-01-13 (×11): qty 2
  Filled 2016-01-13: qty 1
  Filled 2016-01-13 (×2): qty 2

## 2016-01-13 MED ORDER — MENTHOL 3 MG MT LOZG: 1.0000 | LOZENGE | OROMUCOSAL | Status: DC | PRN

## 2016-01-13 MED ORDER — ONDANSETRON 4 MG PO TBDP
4.0000 mg | ORAL_TABLET | Freq: Four times a day (QID) | ORAL | Status: DC | PRN
  Administered 2016-01-22: 4 mg via ORAL
  Filled 2016-01-13: qty 1

## 2016-01-13 MED ORDER — ENOXAPARIN SODIUM 40 MG/0.4ML ~~LOC~~ SOLN
40.0000 mg | SUBCUTANEOUS | Status: DC
  Administered 2016-01-13 – 2016-01-22 (×9): 40 mg via SUBCUTANEOUS
  Filled 2016-01-13 (×8): qty 0.4

## 2016-01-13 NOTE — Progress Notes (Signed)
Patient ID: Traci Hudson, female   DOB: [birth year], 60 y.o.   MRN: [id number]     [facility]      [address] [place], [place] [address]    Phone: [phone] FAX: [phone]     Subjective: Pain is unchanged. No n/v.  ngt placement unsuccessful. Passing flatus, feels like she's going to have a BM.  Vss.  Afebrile.  Wbc unchanged.  k 3.2  Denies dysuria.   Objective:  Vital signs:  Filed Vitals:   01/13/16 0230 01/13/16 0300 01/13/16 0433 01/13/16 0525  BP: 135/82 154/92 130/82 141/82  Pulse: 81 81 86 80  Temp:    98.9 F (37.2 C)  TempSrc:    Oral  Resp:   18 18  Height:    '5\' 5"'$  (1.651 m)  Weight:    70.308 kg (155 lb)  SpO2: 85% 94% 90% 94%    Last BM Date: 01/13/16  Intake/Output   Yesterday:  06/06 0701 - 06/07 0700 In: 275 [I.V.:125; IV Piggyback:150] Out: -  This shift:    I/O last 3 completed shifts: In: 275 [I.V.:125; IV Piggyback:150] Out: -  Total I/O In: -  Out: 50 [Urine:50]   Physical Exam: General: Pt awake/alert/oriented x4 in no acute distress Chest: cta.  No chest wall pain w good excursion CV:  Pulses intact.  Regular rhythm Abdomen: Soft.  Distended.  Generalized ttp, most appreciated llq without guarding.  No evidence of peritonitis.  No incarcerated hernias. Ext:  SCDs BLE.  No mjr edema.  No cyanosis Skin: No petechiae / purpura   Problem List:   Active Problems:   SBO (small bowel obstruction) ([facility])    Results:   Labs: Results for orders placed or performed during the hospital encounter of 01/12/16 (from the past 48 hour(s))  CBC with Differential     Status: Abnormal   Collection Time: 01/13/16 12:05 AM  Result Value Ref Range   WBC 12.3 (H) 4.0 - 10.5 K/uL   RBC 4.81 3.87 - 5.11 MIL/uL   Hemoglobin 13.6 12.0 - 15.0 g/dL   HCT 39.2 36.0 - 46.0 %   MCV 81.5 78.0 - 100.0 fL   MCH 28.3 26.0 - 34.0 pg   MCHC 34.7 30.0 - 36.0 g/dL   RDW 13.4 11.5 - 15.5 %    Platelets 324 150 - 400 K/uL   Neutrophils Relative % 79 %   Lymphocytes Relative 12 %   Monocytes Relative 8 %   Eosinophils Relative 1 %   Basophils Relative 0 %   Neutro Abs 9.7 (H) 1.7 - 7.7 K/uL   Lymphs Abs 1.5 0.7 - 4.0 K/uL   Monocytes Absolute 1.0 0.1 - 1.0 K/uL   Eosinophils Absolute 0.1 0.0 - 0.7 K/uL   Basophils Absolute 0.0 0.0 - 0.1 K/uL   WBC Morphology MILD LEFT SHIFT (1-5% METAS, OCC MYELO, OCC BANDS)   I-stat chem 8, ed     Status: Abnormal   Collection Time: 01/13/16 12:11 AM  Result Value Ref Range   Sodium 131 (L) 135 - 145 mmol/L   Potassium 3.0 (L) 3.5 - 5.1 mmol/L   Chloride 88 (L) 101 - 111 mmol/L   BUN 7 6 - 20 mg/dL   Creatinine, Ser 0.60 0.44 - 1.00 mg/dL   Glucose, Bld 121 (H) 65 - 99 mg/dL   Calcium, Ion 1.13 1.13 - 1.30 mmol/L   TCO2 30 0 - 100 mmol/L  Hemoglobin 14.6 12.0 - 15.0 g/dL   HCT 43.0 36.0 - 57.3 %  Basic metabolic panel     Status: Abnormal   Collection Time: 01/13/16  6:01 AM  Result Value Ref Range   Sodium 132 (L) 135 - 145 mmol/L   Potassium 3.2 (L) 3.5 - 5.1 mmol/L   Chloride 97 (L) 101 - 111 mmol/L   CO2 26 22 - 32 mmol/L   Glucose, Bld 127 (H) 65 - 99 mg/dL   BUN 8 6 - 20 mg/dL   Creatinine, Ser 0.55 0.44 - 1.00 mg/dL   Calcium 8.5 (L) 8.9 - 10.3 mg/dL   GFR calc non Af Amer >60 >60 mL/min   GFR calc Af Amer >60 >60 mL/min    Comment: (NOTE) The eGFR has been calculated using the CKD EPI equation. This calculation has not been validated in all clinical situations. eGFR's persistently <60 mL/min signify possible Chronic Kidney Disease.    Anion gap 9 5 - 15  CBC     Status: Abnormal   Collection Time: 01/13/16  6:01 AM  Result Value Ref Range   WBC 12.8 (H) 4.0 - 10.5 K/uL   RBC 4.53 3.87 - 5.11 MIL/uL   Hemoglobin 12.6 12.0 - 15.0 g/dL   HCT 35.9 (L) 36.0 - 46.0 %   MCV 79.2 78.0 - 100.0 fL   MCH 27.8 26.0 - 34.0 pg   MCHC 35.1 30.0 - 36.0 g/dL   RDW 13.4 11.5 - 15.5 %   Platelets 318 150 - 400 K/uL     Imaging / Studies: Ct Abdomen Pelvis W Contrast  01/13/2016  CLINICAL DATA:  Left lower quadrant pain. History of diverticulitis. EXAM: CT ABDOMEN AND PELVIS WITH CONTRAST TECHNIQUE: Multidetector CT imaging of the abdomen and pelvis was performed using the standard protocol following bolus administration of intravenous contrast. CONTRAST:  15m ISOVUE-300 IOPAMIDOL (ISOVUE-300) INJECTION 61% COMPARISON:  01/07/2016 FINDINGS: Lower chest and abdominal wall: Trace pleural effusions, new. Bibasilar atelectasis. Hepatobiliary: No focal liver abnormality.Cholecystectomy with stable mild prominence of the biliary tree. Biliary labs were normal 01/07/2016. Pancreas: Unremarkable. Spleen: Unremarkable. Adrenals/Urinary Tract: Negative adrenals. No hydronephrosis or stone. Collapsed urinary bladder. Reproductive:Hysterectomy. Stomach/Bowel: Recent diverticulitis with multiple rim enhancing collections in the pelvis. The largest is in the rectovaginal recess and measures up to 7 cm. A cluster of small abscesses present ventral and inferior to the sigmoid colon, in total measuring 7 x 3 cm in axial span. Discrete tubular collection in the deep interloop spaces measuring 5 cm in length by 2 cm in thickness. These 3 measured areas likely do not communicate. Diverticular inflammation with colonic wall thickening is improved. There is new small bowel dilatation and fluid filling proximal to collapsed and avidly enhancing segments in the pelvis. No free pneumoperitoneum. Appendectomy. Vascular/Lymphatic: No acute vascular abnormality. No mass or adenopathy. Musculoskeletal: No acute abnormalities. IMPRESSION: 1. Recent diverticulitis with multiple pockets of abscess in the pelvis. The largest is in the rectovaginal recess and measures 7 cm. 2. Small bowel obstruction secondary to #1. 3. Bibasilar atelectasis with trace right effusion. Electronically Signed   By: JMonte FantasiaM.D.   On: 01/13/2016 04:32   Dg Abd Acute  W/chest  01/13/2016  CLINICAL DATA:  Initial evaluation for acute nausea, vomiting for 1 week. EXAM: DG ABDOMEN ACUTE W/ 1V CHEST COMPARISON:  None. FINDINGS: Cardiac and mediastinal silhouettes are within normal limits. Elevation the right hemidiaphragm with associated right basilar atelectasis. Lungs are otherwise clear without focal infiltrate.  No pulmonary edema. The possible small right pleural effusion. No pneumothorax. Multiple dilated gas-filled loops of small bowel seen scattered throughout the abdomen. Fluid levels on upright projection. Paucity of gas distally. Probable retained enteric contrast material within the colon. Finding suggestive of possible bowel obstruction. These loops measure up to 4.7 cm in diameter. No free air. No soft tissue mass or abnormal calcification. Cholecystectomy clips noted. No acute osseous abnormality. Degenerative changes about the hips and within the lower lumbar spine. IMPRESSION: 1. Multiple dilated loops of small bowel with associated fluid levels, suspicious for mechanical small bowel obstruction. 2. Elevation of the right hemidiaphragm with associated right basilar atelectasis. Probable small right pleural effusion. Electronically Signed   By: Jeannine Boga M.D.   On: 01/13/2016 01:02    Medications / Allergies:  Scheduled Meds: . budesonide (PULMICORT) nebulizer solution  0.5 mg Nebulization BID  . enoxaparin (LOVENOX) injection  40 mg Subcutaneous Q24H  . famotidine (PEPCID) IV  20 mg Intravenous Q12H  . piperacillin-tazobactam (ZOSYN)  IV  3.375 g Intravenous Q8H  . potassium chloride  10 mEq Intravenous Q1 Hr x 4   Continuous Infusions: . 0.9 % NaCl with KCl 20 mEq / L 125 mL/hr at 01/13/16 0600   PRN Meds:.albuterol, morphine injection, ondansetron **OR** ondansetron (ZOFRAN) IV  Antibiotics: Anti-infectives    Start     Dose/Rate Route Frequency Ordered Stop   01/13/16 0530  piperacillin-tazobactam (ZOSYN) IVPB 3.375 g     3.375  g 12.5 mL/hr over 240 Minutes Intravenous Every 8 hours 01/13/16 0519          Assessment/Plan Sigmoid diverticulitis with small bowel obstruction-continue with IV antibiotics and bowel rest.  -Hold off on NGT placement, place for n/v or abdominal distention. -Follow films.  Hopefully symptoms will resolve with conservative management.  Understands if she fails to improve, will need a laparotomy/colectomy/colostomy -Will need a colonoscopy 6-8 weeks(Dr. Collene Mares) ID-zosyn D#1 VTE prophylaxis-scd, lovenox FEN-NPO, IVF.  Hypokalemia-supplement 33mq.  Repeat labs in AM Dispo-inpatient   EErby Pian AA[facility] Pager [phone])   01/13/2016 8:19 AM

## 2016-01-13 NOTE — Progress Notes (Signed)
Pt vomited x3; Green, bilious liquid.  Each time more than 100ccs.  Called PA Emina.  Order to place NG tube given.  Requested orders for ativan and xylocaine, as a failed ng placement attempt earlier left the patient anxious for procedure.  Orders received.  Ativan was given.  Pt does appear to have a mild deviated septum. Xylocaine applied while procedure was explained.  Pt tolerated procedure well. NG tube inserted without issue.  Immediately drained off 200ccs of green and brown thin liquid.  Pt stated immediate relief of nausea and abdominal pain. Complains only of feeling "like something is stuck in the back of my throat".  Cepacol lozenges and throat spray ordered.  Pt now resting comfortably.  Will continue to monitor.  Roselind Rily

## 2016-01-13 NOTE — H&P (Signed)
Traci Hudson is an 60 y.o. female.    Chief Complaint: Abdominal pain, nausea and vomiting   HPI: Patient is a 60 year old female With a previous history of diverticulitis. She says this has been treated on several occasions in the past was requiring hospitalization. Last time was about 5 years ago. The patient presented to the emergency department  6 days agowith the acute onset that day of left lower quadrant abdominal pain. This is similar to her previous episodes of diverticulitis. A CT scan at that time was obtained which I have reviewed Showing extensive diverticulosis and apparent uncomplicated  Sigmoid diverticulitis. The patient was placed on oral antibiotics and discharged for outpatient follow-up.  She states that she did not really improve from that point on.  Continued to have lower abdominal pain and some nausea.  Initially was constipated but a couple of days ago began having bowel movements. Over the past 24 hours she has developed progressive nausea and infrequent vomiting. Also abdominal distention. Her pain is about the same as it was several days ago. No urinary symptoms. No fever or chills.  Past Medical History  Diagnosis Date  . Hypercholesteremia   . Skin cancer   . Insomnia   . DM (diabetes mellitus) ([place])   . Hypertension   . Diverticulitis   . Asthma     Past Surgical History  Procedure Laterality Date  . Brain surgery  1969  . Cholecystectomy    . Breast reduction surgery  2000  . Appendectomy  1998  . Vesicovaginal fistula closure w/ tah    . Abdominal hysterectomy      Family History  Problem Relation Age of Onset  . Emphysema Mother     smoked  . Lung cancer Mother     smoked   Social History:  reports that she has never smoked. She has never used smokeless tobacco. She reports that she drinks about 1.2 oz of alcohol per week. She reports that she does not use illicit drugs.  Allergies:  Allergies  Allergen Reactions  . Codeine Itching  .  Sulfa Antibiotics     GI upset   . Tape Itching and Rash   No current facility-administered medications for this encounter.   Current Outpatient Prescriptions  Medication Sig Dispense Refill  . ARNUITY ELLIPTA 200 MCG/ACT AEPB Inhale 1 puff into the lungs daily.  4  . ciprofloxacin (CIPRO) 500 MG tablet Take 1 tablet (500 mg total) by mouth 2 (two) times daily. 14 tablet 0  . fexofenadine (ALLEGRA) 180 MG tablet Take 180 mg by mouth daily.   5  . fluticasone (FLONASE) 50 MCG/ACT nasal spray Place 1 spray into both nostrils daily.   4  . metroNIDAZOLE (FLAGYL) 500 MG tablet Take 1 tablet (500 mg total) by mouth 3 (three) times daily. 21 tablet 0  . Multiple Vitamin (MULTIVITAMIN WITH MINERALS) TABS tablet Take 1 tablet by mouth daily.    Marland Kitchen omeprazole (PRILOSEC) 20 MG capsule Take 20 mg by mouth daily as needed (acid relux).     . ondansetron (ZOFRAN ODT) 4 MG disintegrating tablet Take 1 tablet (4 mg total) by mouth every 8 (eight) hours as needed for nausea or vomiting. 10 tablet 0  . sertraline (ZOLOFT) 100 MG tablet Take 50 mg by mouth daily.  3  . traMADol (ULTRAM) 50 MG tablet Take 100 mg by mouth every 6 (six) hours as needed for moderate pain or severe pain.    Marland Kitchen zolpidem (AMBIEN) 5  MG tablet Take 2.5 mg by mouth at bedtime as needed for sleep.     Marland Kitchen HYDROcodone-acetaminophen (NORCO/VICODIN) 5-325 MG tablet Take 1-2 tablets every 6 hours as needed for severe pain (Patient not taking: Reported on 01/12/2016) 8 tablet 0  . naproxen sodium (ANAPROX) 220 MG tablet Take 220 mg by mouth 2 (two) times daily as needed (pain).    Marland Kitchen PROAIR HFA 108 (90 BASE) MCG/ACT inhaler Inhale 2 puffs into the lungs every 4 (four) hours as needed for wheezing or shortness of breath.   0  . valACYclovir (VALTREX) 1000 MG tablet Take 1 g by mouth every 12 (twelve) hours as needed (outbreaks).   3     Results for orders placed or performed during the hospital encounter of 01/12/16 (from the past 48 hour(s))  CBC  with Differential     Status: Abnormal   Collection Time: 01/13/16 12:05 AM  Result Value Ref Range   WBC 12.3 (H) 4.0 - 10.5 K/uL   RBC 4.81 3.87 - 5.11 MIL/uL   Hemoglobin 13.6 12.0 - 15.0 g/dL   HCT 39.2 36.0 - 46.0 %   MCV 81.5 78.0 - 100.0 fL   MCH 28.3 26.0 - 34.0 pg   MCHC 34.7 30.0 - 36.0 g/dL   RDW 13.4 11.5 - 15.5 %   Platelets 324 150 - 400 K/uL   Neutrophils Relative % 79 %   Lymphocytes Relative 12 %   Monocytes Relative 8 %   Eosinophils Relative 1 %   Basophils Relative 0 %   Neutro Abs 9.7 (H) 1.7 - 7.7 K/uL   Lymphs Abs 1.5 0.7 - 4.0 K/uL   Monocytes Absolute 1.0 0.1 - 1.0 K/uL   Eosinophils Absolute 0.1 0.0 - 0.7 K/uL   Basophils Absolute 0.0 0.0 - 0.1 K/uL   WBC Morphology MILD LEFT SHIFT (1-5% METAS, OCC MYELO, OCC BANDS)   I-stat chem 8, ed     Status: Abnormal   Collection Time: 01/13/16 12:11 AM  Result Value Ref Range   Sodium 131 (L) 135 - 145 mmol/L   Potassium 3.0 (L) 3.5 - 5.1 mmol/L   Chloride 88 (L) 101 - 111 mmol/L   BUN 7 6 - 20 mg/dL   Creatinine, Ser 0.60 0.44 - 1.00 mg/dL   Glucose, Bld 121 (H) 65 - 99 mg/dL   Calcium, Ion 1.13 1.13 - 1.30 mmol/L   TCO2 30 0 - 100 mmol/L   Hemoglobin 14.6 12.0 - 15.0 g/dL   HCT 43.0 36.0 - 46.0 %   Dg Abd Acute W/chest  01/13/2016  CLINICAL DATA:  Initial evaluation for acute nausea, vomiting for 1 week. EXAM: DG ABDOMEN ACUTE W/ 1V CHEST COMPARISON:  None. FINDINGS: Cardiac and mediastinal silhouettes are within normal limits. Elevation the right hemidiaphragm with associated right basilar atelectasis. Lungs are otherwise clear without focal infiltrate. No pulmonary edema. The possible small right pleural effusion. No pneumothorax. Multiple dilated gas-filled loops of small bowel seen scattered throughout the abdomen. Fluid levels on upright projection. Paucity of gas distally. Probable retained enteric contrast material within the colon. Finding suggestive of possible bowel obstruction. These loops measure up  to 4.7 cm in diameter. No free air. No soft tissue mass or abnormal calcification. Cholecystectomy clips noted. No acute osseous abnormality. Degenerative changes about the hips and within the lower lumbar spine. IMPRESSION: 1. Multiple dilated loops of small bowel with associated fluid levels, suspicious for mechanical small bowel obstruction. 2. Elevation of the right hemidiaphragm with  associated right basilar atelectasis. Probable small right pleural effusion. Electronically Signed   By: Jeannine Boga M.D.   On: 01/13/2016 01:02    Review of Systems  Constitutional: Positive for malaise/fatigue. Negative for fever and chills.  Respiratory: Negative.   Cardiovascular: Negative.   Gastrointestinal: Positive for nausea, vomiting and abdominal pain. Negative for diarrhea and constipation.  Genitourinary: Negative.     Blood pressure 129/83, pulse 84, temperature 98.2 F (36.8 C), temperature source Oral, resp. rate 18, SpO2 94 %. Physical Exam  General: Alert, Pleasant and well-developed Caucasian female, Appear somewhat uncomfortable Skin: Warm and dry without rash or infection. HEENT: No palpable masses or thyromegaly. Sclera nonicteric. Pupils equal round and reactive.  Lymph nodes: No cervical, supraclavicular,  nodes palpable. Lungs: Breath sounds clear and equal without increased work of breathing Cardiovascular: Regular rate and rhythm without murmur. No JVD or edema. Peripheral pulses intact. Abdomen: Hyperactive bowel sounds.. Mild to moderate distention. Localized moderate left lower quadrant tenderness with some guarding but no peritoneal signs. No palpable masses. No hernias or organomegaly. Extremities: No edema or joint swelling or deformity. No chronic venous stasis changes. Neurologic: Alert and fully oriented. Affect normal. No gross motor deficits  Assessment/Plan Sigmoid diverticulitis and now small bowel obstruction.  Likely inflammatory adhesions from her sigmoid  diverticulitis. Hypokalemia and hyponatremia Patient will be admitted and NG tube placed. Start IV antibiotics. Normal saline IV fluids and replace potassium chloride. Follow small bowel obstruction on plain films.  If she does not improve quickly should probably have a repeat CT scan to rule out abscess or some other topical dictating factor from her diverticulitis.  All this explained to the patient and her friend and all questions answered.  Edward Jolly, MD 01/13/2016, 2:25 AM

## 2016-01-14 ENCOUNTER — Inpatient Hospital Stay (HOSPITAL_COMMUNITY): Payer: BLUE CROSS/BLUE SHIELD

## 2016-01-14 LAB — BASIC METABOLIC PANEL
Anion gap: 9 (ref 5–15)
BUN: 6 mg/dL (ref 6–20)
CALCIUM: 8 mg/dL — AB (ref 8.9–10.3)
CO2: 24 mmol/L (ref 22–32)
CREATININE: 0.6 mg/dL (ref 0.44–1.00)
Chloride: 100 mmol/L — ABNORMAL LOW (ref 101–111)
Glucose, Bld: 98 mg/dL (ref 65–99)
Potassium: 3.8 mmol/L (ref 3.5–5.1)
SODIUM: 133 mmol/L — AB (ref 135–145)

## 2016-01-14 LAB — CBC
HCT: 35.3 % — ABNORMAL LOW (ref 36.0–46.0)
HEMOGLOBIN: 11.7 g/dL — AB (ref 12.0–15.0)
MCH: 26.8 pg (ref 26.0–34.0)
MCHC: 33.1 g/dL (ref 30.0–36.0)
MCV: 81 fL (ref 78.0–100.0)
PLATELETS: 288 10*3/uL (ref 150–400)
RBC: 4.36 MIL/uL (ref 3.87–5.11)
RDW: 13.3 % (ref 11.5–15.5)
WBC: 10.5 10*3/uL (ref 4.0–10.5)

## 2016-01-14 NOTE — Progress Notes (Signed)
Patient ID: Traci Hudson, female   DOB: 1955/11/21, 60 y.o.   MRN: [id number]     [address] [place], [place] [address]    Phone: [phone] FAX: [phone]     Subjective: Had a small bm this AM, however, vomited while ngt was clamped during AXR. Wbc normal. Afebrile. Reports her pain has significantly improved.   473m ngt output.   Objective:  Vital signs:  Filed Vitals:   01/13/16 1914 01/13/16 2133 01/14/16 0053 01/14/16 0601  BP:  162/84 145/87 149/89  Pulse:  78 86 77  Temp:  98.8 F (37.1 C) 97.7 F (36.5 C) 98.2 F (36.8 C)  TempSrc:  Oral Oral Oral  Resp:  _0 Height:      Weight:      SpO2: 97% 98% 99% 98%    Last BM Date: 01/14/16  Intake/Output   Yesterday:  06/07 0701 - 06/08 0700 In: 3079.2 [P.O.:50; I.V.:2879.2; IV Piggyback:150] Out: 12111[[BZMCE:0223 Emesis/NG output:450] This shift:    I/O last 3 completed shifts: In: 3354.2 [P.O.:50; I.V.:3004.2; IV Piggyback:300] Out: 13612[[AESLP:5300 Emesis/NG output:450]    Physical Exam: General: Pt awake/alert/oriented x4 in no acute distress Chest: cta. No chest wall pain w good excursion CV: Pulses intact. Regular rhythm Abdomen: Soft. non distended. ttp llq..Marland KitchenNo evidence of peritonitis. No incarcerated hernias. Ext: SCDs BLE. No mjr edema. No cyanosis Skin: No petechiae / purpura    Problem List:   Active Problems:   SBO (small bowel obstruction) ([facility])    Results:   Labs: Results for orders placed or performed during the hospital encounter of 01/12/16 (from the past 48 hour(s))  CBC with Differential     Status: Abnormal   Collection Time: 01/13/16 12:05 AM  Result Value Ref Range   WBC 12.3 (H) 4.0 - 10.5 K/uL   RBC 4.81 3.87 - 5.11 MIL/uL   Hemoglobin 13.6 12.0 - 15.0 g/dL   HCT 39.2 36.0 - 46.0 %   MCV 81.5 78.0 - 100.0 fL   MCH 28.3 26.0 - 34.0 pg   MCHC 34.7 30.0 - 36.0 g/dL   RDW 13.4  11.5 - 15.5 %   Platelets 324 150 - 400 K/uL   Neutrophils Relative % 79 %   Lymphocytes Relative 12 %   Monocytes Relative 8 %   Eosinophils Relative 1 %   Basophils Relative 0 %   Neutro Abs 9.7 (H) 1.7 - 7.7 K/uL   Lymphs Abs 1.5 0.7 - 4.0 K/uL   Monocytes Absolute 1.0 0.1 - 1.0 K/uL   Eosinophils Absolute 0.1 0.0 - 0.7 K/uL   Basophils Absolute 0.0 0.0 - 0.1 K/uL   WBC Morphology MILD LEFT SHIFT (1-5% METAS, OCC MYELO, OCC BANDS)   I-stat chem 8, ed     Status: Abnormal   Collection Time: 01/13/16 12:11 AM  Result Value Ref Range   Sodium 131 (L) 135 - 145 mmol/L   Potassium 3.0 (L) 3.5 - 5.1 mmol/L   Chloride 88 (L) 101 - 111 mmol/L   BUN 7 6 - 20 mg/dL   Creatinine, Ser 0.60 0.44 - 1.00 mg/dL   Glucose, Bld 121 (H) 65 - 99 mg/dL   Calcium, Ion 1.13 1.13 - 1.30 mmol/L   TCO2 30 0 - 100 mmol/L   Hemoglobin 14.6 12.0 - 15.0 g/dL   HCT 43.0 36.0 - 451.1%  Basic metabolic panel  Status: Abnormal   Collection Time: 01/13/16  6:01 AM  Result Value Ref Range   Sodium 132 (L) 135 - 145 mmol/L   Potassium 3.2 (L) 3.5 - 5.1 mmol/L   Chloride 97 (L) 101 - 111 mmol/L   CO2 26 22 - 32 mmol/L   Glucose, Bld 127 (H) 65 - 99 mg/dL   BUN 8 6 - 20 mg/dL   Creatinine, Ser 0.55 0.44 - 1.00 mg/dL   Calcium 8.5 (L) 8.9 - 10.3 mg/dL   GFR calc non Af Amer >60 >60 mL/min   GFR calc Af Amer >60 >60 mL/min    Comment: (NOTE) The eGFR has been calculated using the CKD EPI equation. This calculation has not been validated in all clinical situations. eGFR's persistently <60 mL/min signify possible Chronic Kidney Disease.    Anion gap 9 5 - 15  CBC     Status: Abnormal   Collection Time: 01/13/16  6:01 AM  Result Value Ref Range   WBC 12.8 (H) 4.0 - 10.5 K/uL   RBC 4.53 3.87 - 5.11 MIL/uL   Hemoglobin 12.6 12.0 - 15.0 g/dL   HCT 35.9 (L) 36.0 - 46.0 %   MCV 79.2 78.0 - 100.0 fL   MCH 27.8 26.0 - 34.0 pg   MCHC 35.1 30.0 - 36.0 g/dL   RDW 13.4 11.5 - 15.5 %   Platelets 318 150 - 400  K/uL  CBC     Status: Abnormal   Collection Time: 01/14/16  4:08 AM  Result Value Ref Range   WBC 10.5 4.0 - 10.5 K/uL   RBC 4.36 3.87 - 5.11 MIL/uL   Hemoglobin 11.7 (L) 12.0 - 15.0 g/dL   HCT 35.3 (L) 36.0 - 46.0 %   MCV 81.0 78.0 - 100.0 fL   MCH 26.8 26.0 - 34.0 pg   MCHC 33.1 30.0 - 36.0 g/dL   RDW 13.3 11.5 - 15.5 %   Platelets 288 150 - 400 K/uL  Basic metabolic panel     Status: Abnormal   Collection Time: 01/14/16  4:08 AM  Result Value Ref Range   Sodium 133 (L) 135 - 145 mmol/L   Potassium 3.8 3.5 - 5.1 mmol/L   Chloride 100 (L) 101 - 111 mmol/L   CO2 24 22 - 32 mmol/L   Glucose, Bld 98 65 - 99 mg/dL   BUN 6 6 - 20 mg/dL   Creatinine, Ser 0.60 0.44 - 1.00 mg/dL   Calcium 8.0 (L) 8.9 - 10.3 mg/dL   GFR calc non Af Amer >60 >60 mL/min   GFR calc Af Amer >60 >60 mL/min    Comment: (NOTE) The eGFR has been calculated using the CKD EPI equation. This calculation has not been validated in all clinical situations. eGFR's persistently <60 mL/min signify possible Chronic Kidney Disease.    Anion gap 9 5 - 15    Imaging / Studies: Ct Abdomen Pelvis W Contrast  01/13/2016  CLINICAL DATA:  Left lower quadrant pain. History of diverticulitis. EXAM: CT ABDOMEN AND PELVIS WITH CONTRAST TECHNIQUE: Multidetector CT imaging of the abdomen and pelvis was performed using the standard protocol following bolus administration of intravenous contrast. CONTRAST:  114m ISOVUE-300 IOPAMIDOL (ISOVUE-300) INJECTION 61% COMPARISON:  01/07/2016 FINDINGS: Lower chest and abdominal wall: Trace pleural effusions, new. Bibasilar atelectasis. Hepatobiliary: No focal liver abnormality.Cholecystectomy with stable mild prominence of the biliary tree. Biliary labs were normal 01/07/2016. Pancreas: Unremarkable. Spleen: Unremarkable. Adrenals/Urinary Tract: Negative adrenals. No hydronephrosis or stone. Collapsed urinary  bladder. Reproductive:Hysterectomy. Stomach/Bowel: Recent diverticulitis with multiple  rim enhancing collections in the pelvis. The largest is in the rectovaginal recess and measures up to 7 cm. A cluster of small abscesses present ventral and inferior to the sigmoid colon, in total measuring 7 x 3 cm in axial span. Discrete tubular collection in the deep interloop spaces measuring 5 cm in length by 2 cm in thickness. These 3 measured areas likely do not communicate. Diverticular inflammation with colonic wall thickening is improved. There is new small bowel dilatation and fluid filling proximal to collapsed and avidly enhancing segments in the pelvis. No free pneumoperitoneum. Appendectomy. Vascular/Lymphatic: No acute vascular abnormality. No mass or adenopathy. Musculoskeletal: No acute abnormalities. IMPRESSION: 1. Recent diverticulitis with multiple pockets of abscess in the pelvis. The largest is in the rectovaginal recess and measures 7 cm. 2. Small bowel obstruction secondary to #1. 3. Bibasilar atelectasis with trace right effusion. Electronically Signed   By: Monte Fantasia M.D.   On: 01/13/2016 04:32   Dg Abd 2 Views  01/14/2016  CLINICAL DATA:  Small bowel obstruction. EXAM: ABDOMEN - 2 VIEW COMPARISON:  01/13/2016 FINDINGS: The small bowel loops are diffusely dilated and measure up to 4 cm. No free intraperitoneal air identified. Nasogastric tube appears coiled in the stomach. IMPRESSION: Persistent abnormal bowel dilatation compatible with small bowel obstruction. This does not appear significantly improved from the previous exam. Electronically Signed   By: Kerby Moors M.D.   On: 01/14/2016 08:30   Dg Abd Acute W/chest  01/13/2016  CLINICAL DATA:  Initial evaluation for acute nausea, vomiting for 1 week. EXAM: DG ABDOMEN ACUTE W/ 1V CHEST COMPARISON:  None. FINDINGS: Cardiac and mediastinal silhouettes are within normal limits. Elevation the right hemidiaphragm with associated right basilar atelectasis. Lungs are otherwise clear without focal infiltrate. No pulmonary edema. The  possible small right pleural effusion. No pneumothorax. Multiple dilated gas-filled loops of small bowel seen scattered throughout the abdomen. Fluid levels on upright projection. Paucity of gas distally. Probable retained enteric contrast material within the colon. Finding suggestive of possible bowel obstruction. These loops measure up to 4.7 cm in diameter. No free air. No soft tissue mass or abnormal calcification. Cholecystectomy clips noted. No acute osseous abnormality. Degenerative changes about the hips and within the lower lumbar spine. IMPRESSION: 1. Multiple dilated loops of small bowel with associated fluid levels, suspicious for mechanical small bowel obstruction. 2. Elevation of the right hemidiaphragm with associated right basilar atelectasis. Probable small right pleural effusion. Electronically Signed   By: Jeannine Boga M.D.   On: 01/13/2016 01:02    Medications / Allergies:  Scheduled Meds: . budesonide (PULMICORT) nebulizer solution  0.5 mg Nebulization BID  . enoxaparin (LOVENOX) injection  40 mg Subcutaneous Q24H  . famotidine (PEPCID) IV  20 mg Intravenous Q12H  . piperacillin-tazobactam (ZOSYN)  IV  3.375 g Intravenous Q8H   Continuous Infusions: . 0.9 % NaCl with KCl 20 mEq / L 125 mL/hr at 01/14/16 0051   PRN Meds:.albuterol, menthol-cetylpyridinium, morphine injection, ondansetron **OR** ondansetron (ZOFRAN) IV, phenol  Antibiotics: Anti-infectives    Start     Dose/Rate Route Frequency Ordered Stop   01/13/16 0530  piperacillin-tazobactam (ZOSYN) IVPB 3.375 g     3.375 g 12.5 mL/hr over 240 Minutes Intravenous Every 8 hours 01/13/16 0519          Assessment/Plan Sigmoid diverticulitis with small bowel obstruction-continue with IV antibiotics and bowel rest.  -continue NGT decompression -less tender and more focally tender to  llq, having bowel function, afebrile, wbc normal.  However, axr shows a persistent SBO.  Will continue non op management.  She  understands should sbo fail to improve she will need a laparotomy, colon resection and colostomy.  -Will need a colonoscopy 6-8 weeks(Dr. Collene Mares) ID-zosyn D#2 VTE prophylaxis-scd, lovenox FEN-NPO, IVF. Hypokalemia-resolved  Dispo-inpatient   Erby Pian, [facility] Surgery Pager [phone])   01/14/2016 8:46 AM

## 2016-01-15 ENCOUNTER — Inpatient Hospital Stay (HOSPITAL_COMMUNITY): Payer: BLUE CROSS/BLUE SHIELD

## 2016-01-15 ENCOUNTER — Encounter (HOSPITAL_COMMUNITY): Payer: Self-pay | Admitting: Radiology

## 2016-01-15 MED ORDER — IOPAMIDOL (ISOVUE-300) INJECTION 61%
100.0000 mL | Freq: Once | INTRAVENOUS | Status: AC | PRN
  Administered 2016-01-15: 100 mL via INTRAVENOUS

## 2016-01-15 MED ORDER — FLUCONAZOLE IN SODIUM CHLORIDE 200-0.9 MG/100ML-% IV SOLN
200.0000 mg | Freq: Once | INTRAVENOUS | Status: AC
  Administered 2016-01-15: 200 mg via INTRAVENOUS
  Filled 2016-01-15: qty 100

## 2016-01-15 MED ORDER — DIATRIZOATE MEGLUMINE & SODIUM 66-10 % PO SOLN
30.0000 mL | ORAL | Status: DC | PRN
  Filled 2016-01-15: qty 30

## 2016-01-15 NOTE — Progress Notes (Signed)
[facility] Progress Note     Subjective: Pt doing okay, but still has pain in LLQ.  Had a BM and passing flatus.  NPO.  NG output will very little out.  Objective: Vital signs in last 24 hours: Temp:  [98 F (36.7 C)-99.9 F (37.7 C)] 98.9 F (37.2 C) (06/09 0552) Pulse Rate:  [77-84] 77 (06/09 0552) Resp:  [16] 16 (06/09 0552) BP: (142-152)/(75-87) 142/80 mmHg (06/09 0552) SpO2:  [96 %-99 %] 97 % (06/09 0855) Last BM Date: 01/21/16  Intake/Output from previous day: 01/21/23 0701 - 06/09 0700 In: 1005.8 [I.V.:995.8; NG/GT:10] Out: 2800 [Urine:2450; Emesis/NG output:350] Intake/Output this shift: Total I/O In: -  Out: 550 [Urine:550]  PE: Gen:  Alert, NAD, pleasant Abd: Soft, distended, tender to palpation over LLQ, +BS, no HSM  Lab Results:   Recent Labs  01/13/16 0601 2016/01/21 0408  WBC 12.8* 10.5  HGB 12.6 11.7*  HCT 35.9* 35.3*  PLT 318 288   BMET  Recent Labs  01/13/16 0601 21-Jan-2016 0408  NA 132* 133*  K 3.2* 3.8  CL 97* 100*  CO2 26 24  GLUCOSE 127* 98  BUN 8 6  CREATININE 0.55 0.60  CALCIUM 8.5* 8.0*   PT/INR No results for input(s): LABPROT, INR in the last 72 hours. CMP     Component Value Date/Time   NA 133* 01-21-16 0408   K 3.8 01-21-16 0408   CL 100* 2016-01-21 0408   CO2 24 01/21/2016 0408   GLUCOSE 98 2016/01/21 0408   BUN 6 01/21/2016 0408   CREATININE 0.60 01/21/16 0408   CALCIUM 8.0* 01/21/16 0408   PROT 7.5 01/07/2016 1124   ALBUMIN 4.5 01/07/2016 1124   AST 25 01/07/2016 1124   ALT 20 01/07/2016 1124   ALKPHOS 67 01/07/2016 1124   BILITOT 0.3 01/07/2016 1124   GFRNONAA >60 2016/01/21 0408   GFRAA >60 01/21/2016 0408   Lipase  No results found for: LIPASE     Studies/Results: Dg Abd 2 Views  01-21-16  CLINICAL DATA:  Small bowel obstruction. EXAM: ABDOMEN - 2 VIEW COMPARISON:  01/13/2016 FINDINGS: The small bowel loops are diffusely dilated and measure up to 4 cm. No free intraperitoneal air  identified. Nasogastric tube appears coiled in the stomach. IMPRESSION: Persistent abnormal bowel dilatation compatible with small bowel obstruction. This does not appear significantly improved from the previous exam. Electronically Signed   By: Kerby Moors M.D.   On: 01-21-16 08:30    Anti-infectives: Anti-infectives    Start     Dose/Rate Route Frequency Ordered Stop   01/13/16 0530  piperacillin-tazobactam (ZOSYN) IVPB 3.375 g     3.375 g 12.5 mL/hr over 240 Minutes Intravenous Every 8 hours 01/13/16 0519         Assessment/Plan Sigmoid diverticulitis with small bowel obstruction -Continue with IV antibiotics and bowel rest.  -Continue NGT decompression -Less tender and more focally tender to llq, having bowel function, afebrile, wbc normal. However, axr shows a persistent SBO. Will continue non op management. She understands should sbo fail to improve she will need a laparotomy, colon resection and colostomy.  -NG output low at 352ml -Will need a colonoscopy 6-8 weeks (Dr. Collene Mares) -Repeat CT scan today, last CT was 6/7 ID-zosyn D#4 VTE prophylaxis-scd, lovenox FEN-NPO, IVF. Hypokalemia-resolved  Dispo-inpatient     LOS: 2 days    Nat Christen 01/15/2016, 11:00 AM Pager: [phone]  (7am - 4:30pm M-F; 7am - 11:30am Sa/Su)  CT scan pending with contrast

## 2016-01-16 ENCOUNTER — Inpatient Hospital Stay (HOSPITAL_COMMUNITY): Payer: BLUE CROSS/BLUE SHIELD

## 2016-01-16 ENCOUNTER — Encounter (HOSPITAL_COMMUNITY): Payer: Self-pay | Admitting: Physician Assistant

## 2016-01-16 MED ORDER — FENTANYL CITRATE (PF) 100 MCG/2ML IJ SOLN
INTRAMUSCULAR | Status: AC | PRN
  Administered 2016-01-16 (×2): 25 ug via INTRAVENOUS
  Administered 2016-01-16: 50 ug via INTRAVENOUS

## 2016-01-16 MED ORDER — MIDAZOLAM HCL 2 MG/2ML IJ SOLN
INTRAMUSCULAR | Status: AC | PRN
  Administered 2016-01-16 (×5): 1 mg via INTRAVENOUS

## 2016-01-16 MED ORDER — FENTANYL CITRATE (PF) 100 MCG/2ML IJ SOLN
INTRAMUSCULAR | Status: AC
  Filled 2016-01-16: qty 4

## 2016-01-16 MED ORDER — MIDAZOLAM HCL 2 MG/2ML IJ SOLN
INTRAMUSCULAR | Status: AC
  Filled 2016-01-16: qty 6

## 2016-01-16 MED ORDER — LORAZEPAM 2 MG/ML IJ SOLN
0.5000 mg | Freq: Three times a day (TID) | INTRAMUSCULAR | Status: DC | PRN
  Administered 2016-01-16: 0.5 mg via INTRAVENOUS
  Filled 2016-01-16: qty 1

## 2016-01-16 NOTE — H&P (Signed)
Chief Complaint: Diverticulitis with abdominal fluid collections  Referring Physician(s): Dr. Alphonsa Overall  Supervising Physician: Jacqulynn Cadet  Patient Status: In-pt   History of Present Illness: Traci Hudson is a 60 y.o. female with history of diverticulitis.   She has been treated on several occasions in the past was requiring hospitalization. Last time was about 5 years ago.   She  presented to the emergency department on June 1st with the acute onset of left lower quadrant abdominal pain.   She states this is similar to her previous episodes of diverticulitis.   CT scan showed sigmoid diverticulitis without abscess or perforation. Associated small volume of free fluid in the pelvis and right pericolic gutter is noted.  She was placed on oral antibiotics and discharged for outpatient follow-up.   She states that she did not really improve from that point on.   She continued to have lower abdominal pain and nausea.   Over the past 24 hours she developed progressive nausea and vomiting and abdominal distention.   She states her pain is about the same as it was several days ago.   CT scan done yesterday showed acute sigmoid diverticulitis involving the sigmoid colon multiple fluid collections are identified within the pelvis, the largest is in the rectovaginal recess measuring 6.8 cm and a small bowel obstruction.  She denies fever or chills.  NGT is in place.  We are asked to evaluate her for image guided percutaneous drain placement.  Past Medical History  Diagnosis Date  . Hypercholesteremia   . Skin cancer   . Insomnia   . Hypertension   . Diverticulitis   . Asthma   . DM (diabetes mellitus) ([place])     Past Surgical History  Procedure Laterality Date  . Brain surgery  1969  . Cholecystectomy    . Breast reduction surgery  2000  . Appendectomy  1998  . Vesicovaginal fistula closure w/ tah    . Abdominal hysterectomy       Allergies: Codeine; Sulfa antibiotics; and Tape  Medications: Prior to Admission medications   Medication Sig Start Date End Date Taking? Authorizing Provider  ARNUITY ELLIPTA 200 MCG/ACT AEPB Inhale 1 puff into the lungs daily. 12/26/15  Yes Historical Provider, MD  ciprofloxacin (CIPRO) 500 MG tablet Take 1 tablet (500 mg total) by mouth 2 (two) times daily. 01/07/16  Yes Carlisle Cater, PA-C  fexofenadine (ALLEGRA) 180 MG tablet Take 180 mg by mouth daily.  11/17/15  Yes Historical Provider, MD  fluticasone (FLONASE) 50 MCG/ACT nasal spray Place 1 spray into both nostrils daily.  10/22/15  Yes Historical Provider, MD  metroNIDAZOLE (FLAGYL) 500 MG tablet Take 1 tablet (500 mg total) by mouth 3 (three) times daily. 01/07/16  Yes Carlisle Cater, PA-C  Multiple Vitamin (MULTIVITAMIN WITH MINERALS) TABS tablet Take 1 tablet by mouth daily.   Yes Historical Provider, MD  omeprazole (PRILOSEC) 20 MG capsule Take 20 mg by mouth daily as needed (acid relux).    Yes Historical Provider, MD  ondansetron (ZOFRAN ODT) 4 MG disintegrating tablet Take 1 tablet (4 mg total) by mouth every 8 (eight) hours as needed for nausea or vomiting. 01/07/16  Yes Carlisle Cater, PA-C  sertraline (ZOLOFT) 100 MG tablet Take 50 mg by mouth daily. 11/09/15  Yes Historical Provider, MD  traMADol (ULTRAM) 50 MG tablet Take 100 mg by mouth every 6 (six) hours as needed for moderate pain or severe pain.   Yes Historical Provider, MD  zolpidem (  AMBIEN) 5 MG tablet Take 2.5 mg by mouth at bedtime as needed for sleep.    Yes Historical Provider, MD  naproxen sodium (ANAPROX) 220 MG tablet Take 220 mg by mouth 2 (two) times daily as needed (pain).    Historical Provider, MD  PROAIR HFA 108 (90 BASE) MCG/ACT inhaler Inhale 2 puffs into the lungs every 4 (four) hours as needed for wheezing or shortness of breath.  12/21/14   Historical Provider, MD  valACYclovir (VALTREX) 1000 MG tablet Take 1 g by mouth every 12 (twelve) hours as needed  (outbreaks).  11/09/15   Historical Provider, MD     Family History  Problem Relation Age of Onset  . Emphysema Mother     smoked  . Lung cancer Mother     smoked    Social History   Social History  . Marital Status: Single    Spouse Name: N/A  . Number of Children: N/A  . Years of Education: N/A   Occupational History  . sales    Social History Main Topics  . Smoking status: Never Smoker   . Smokeless tobacco: Never Used  . Alcohol Use: 1.2 oz/week    2 Glasses of wine per week     Comment: occasionally  . Drug Use: No  . Sexual Activity: Not Asked   Other Topics Concern  . None   Social History Narrative     Review of Systems: A 12 point ROS discussed  Review of Systems  Constitutional: Positive for appetite change. Negative for fever, chills, activity change and fatigue.  HENT: Negative.   Respiratory: Negative for cough and shortness of breath.   Cardiovascular: Negative for chest pain.  Gastrointestinal: Positive for nausea, vomiting and abdominal pain.  Genitourinary: Negative.   Musculoskeletal: Negative.   Skin: Negative.   Neurological: Negative.   Hematological: Negative.   Psychiatric/Behavioral: Negative.     Vital Signs: BP 135/84 mmHg  Pulse 75  Temp(Src) 98.7 F (37.1 C) (Oral)  Resp 18  Ht 5\' 5"  (1.651 m)  Wt 155 lb (70.308 kg)  BMI 25.79 kg/m2  SpO2 93%  Physical Exam  Constitutional: She is oriented to person, place, and time. She appears well-developed and well-nourished.  HENT:  Head: Normocephalic and atraumatic.  Eyes: EOM are normal.  Neck: Normal range of motion. Neck supple.  Cardiovascular: Normal rate, regular rhythm and normal heart sounds.   No murmur heard. Pulmonary/Chest: Effort normal and breath sounds normal. No respiratory distress. She has no wheezes.  Abdominal: Soft. There is tenderness.  Diminished bowel sounds  Musculoskeletal: Normal range of motion.  Neurological: She is alert and oriented to person,  place, and time.  Skin: Skin is warm and dry.  Psychiatric: She has a normal mood and affect. Her behavior is normal. Judgment and thought content normal.  Vitals reviewed.   Mallampati Score:  MD Evaluation Airway: WNL Heart: WNL Abdomen: WNL Chest/ Lungs: WNL ASA  Classification: 2 Mallampati/Airway Score: Two  Imaging: Ct Abdomen Pelvis W Contrast  01/15/2016  CLINICAL DATA:  Left lower quadrant pain.  Evaluate abscess EXAM: CT ABDOMEN AND PELVIS WITH CONTRAST TECHNIQUE: Multidetector CT imaging of the abdomen and pelvis was performed using the standard protocol following bolus administration of intravenous contrast. CONTRAST:  138mL ISOVUE-300 IOPAMIDOL (ISOVUE-300) INJECTION 61% COMPARISON:  01/13/2016 FINDINGS: Lower chest: There is a small right pleural effusion identified. Atelectasis is identified within both lung bases right greater than left. No airspace consolidation. Hepatobiliary: No focal liver abnormality.  Previous coli cystectomy. Increase caliber of the CBD measures 1 cm. No obstructing stone or mass. Pancreas: No mass, inflammatory changes, or other significant abnormality. Spleen: Negative Adrenals/Urinary Tract: The adrenal glands are both normal. Normal appearance of both kidneys. Urinary bladder is unremarkable. Stomach/Bowel: NG tube is within the stomach. Increase caliber of the small bowel loops, some of which have air-fluid levels. These measure up to 3.2 cm. Numerous colonic diverticula identified. Wall thickening and inflammation involving the sigmoid colon is again identified. Findings compatible with acute diverticulitis. Vascular/Lymphatic: No pathologically enlarged lymph nodes. No evidence of abdominal aortic aneurysm. Reproductive: Previous hysterectomy.  No adnexal mass. Other: Multiple fluid collections are identified within the pelvis. The largest is within the rectovaginal recess measuring 6 cm. Previously this measured 6.8 cm. Left lower quadrant small bowel  interloop fluid collection measures 4.6 cm, image 65 of series 2. Previously 5.2 cm. Again identified is a cluster of small abscess ease adjacent to the sigmoid colon measuring 7.2 x 3.5 cm. Previously 6.8 x 3.2 cm. No significant pneumoperitoneum identified. 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 No aggressive lytic or sclerotic bone lesions noted. IMPRESSION: 1. Again noted are changes of acute sigmoid diverticulitis involving the sigmoid colon. 2. Multiple fluid collections are identified within the pelvis. These are not significantly improved when compared with exam from 01/13/2016. The largest is in the rectovaginal recess measuring 6.8 cm. 3. Small bowel obstruction secondary to #1. Electronically Signed   By: Kerby Moors M.D.   On: 01/15/2016 15:32   Ct Abdomen Pelvis W Contrast  01/13/2016  CLINICAL DATA:  Left lower quadrant pain. History of diverticulitis. EXAM: CT ABDOMEN AND PELVIS WITH CONTRAST TECHNIQUE: Multidetector CT imaging of the abdomen and pelvis was performed using the standard protocol following bolus administration of intravenous contrast. CONTRAST:  175mL ISOVUE-300 IOPAMIDOL (ISOVUE-300) INJECTION 61% COMPARISON:  01/07/2016 FINDINGS: Lower chest and abdominal wall: Trace pleural effusions, new. Bibasilar atelectasis. Hepatobiliary: No focal liver abnormality.Cholecystectomy with stable mild prominence of the biliary tree. Biliary labs were normal 01/07/2016. Pancreas: Unremarkable. Spleen: Unremarkable. Adrenals/Urinary Tract: Negative adrenals. No hydronephrosis or stone. Collapsed urinary bladder. Reproductive:Hysterectomy. Stomach/Bowel: Recent diverticulitis with multiple rim enhancing collections in the pelvis. The largest is in the rectovaginal recess and measures up to 7 cm. A cluster of small abscesses present ventral and inferior to the sigmoid colon, in total measuring 7 x 3 cm in axial span. Discrete tubular collection in the deep interloop  spaces measuring 5 cm in length by 2 cm in thickness. These 3 measured areas likely do not communicate. Diverticular inflammation with colonic wall thickening is improved. There is new small bowel dilatation and fluid filling proximal to collapsed and avidly enhancing segments in the pelvis. No free pneumoperitoneum. Appendectomy. Vascular/Lymphatic: No acute vascular abnormality. No mass or adenopathy. Musculoskeletal: No acute abnormalities. IMPRESSION: 1. Recent diverticulitis with multiple pockets of abscess in the pelvis. The largest is in the rectovaginal recess and measures 7 cm. 2. Small bowel obstruction secondary to #1. 3. Bibasilar atelectasis with trace right effusion. Electronically Signed   By: Monte Fantasia M.D.   On: 01/13/2016 04:32   Ct Abdomen Pelvis W Contrast  01/07/2016  CLINICAL DATA:  Abdominal pain and nausea beginning this morning. No known injury. Initial encounter. EXAM: CT ABDOMEN AND PELVIS WITH CONTRAST TECHNIQUE: Multidetector CT imaging of the abdomen and pelvis was performed using the standard protocol following bolus administration of intravenous contrast. CONTRAST:  100 ml ISOVUE-300 IOPAMIDOL (ISOVUE-300) INJECTION 61% COMPARISON:  CT abdomen and pelvis 08/20/2009. FINDINGS: There is some dependent atelectasis in the lung bases. No pleural or pericardial effusion. Small amount of contrast is seen in the distal esophagus compatible with either poor esophageal motility and/or reflux. Very small hiatal hernia is identified. The patient is status post cholecystectomy. The liver, spleen, pancreas, kidneys and adrenal glands appear normal. Extensive diverticular disease is identified. There is marked stranding and wall thickening in the mid sigmoid colon consistent with superimposed acute diverticulitis. Associated small volume of free pelvic fluid and fluid in the right pericolic gutter is noted. No abscess or perforation is identified. The stomach and small bowel appear normal.  The patient is status post hysterectomy. No lymphadenopathy is identified. No worrisome bony lesion is seen. Bilateral hip osteoarthritis is noted. IMPRESSION: Findings consistent with sigmoid diverticulitis without abscess or perforation. Associated small volume of free fluid in the pelvis and right pericolic gutter is noted. Small volume of contrast in the distal esophagus is compatible with the there esophageal dysmotility and/or gastroesophageal reflux. Very small hiatal hernia. Status post cholecystectomy and hysterectomy. Electronically Signed   By: Inge Rise M.D.   On: 01/07/2016 12:52   Dg Abd 2 Views  01/14/2016  CLINICAL DATA:  Small bowel obstruction. EXAM: ABDOMEN - 2 VIEW COMPARISON:  01/13/2016 FINDINGS: The small bowel loops are diffusely dilated and measure up to 4 cm. No free intraperitoneal air identified. Nasogastric tube appears coiled in the stomach. IMPRESSION: Persistent abnormal bowel dilatation compatible with small bowel obstruction. This does not appear significantly improved from the previous exam. Electronically Signed   By: Kerby Moors M.D.   On: 01/14/2016 08:30   Dg Abd Acute W/chest  01/13/2016  CLINICAL DATA:  Initial evaluation for acute nausea, vomiting for 1 week. EXAM: DG ABDOMEN ACUTE W/ 1V CHEST COMPARISON:  None. FINDINGS: Cardiac and mediastinal silhouettes are within normal limits. Elevation the right hemidiaphragm with associated right basilar atelectasis. Lungs are otherwise clear without focal infiltrate. No pulmonary edema. The possible small right pleural effusion. No pneumothorax. Multiple dilated gas-filled loops of small bowel seen scattered throughout the abdomen. Fluid levels on upright projection. Paucity of gas distally. Probable retained enteric contrast material within the colon. Finding suggestive of possible bowel obstruction. These loops measure up to 4.7 cm in diameter. No free air. No soft tissue mass or abnormal calcification.  Cholecystectomy clips noted. No acute osseous abnormality. Degenerative changes about the hips and within the lower lumbar spine. IMPRESSION: 1. Multiple dilated loops of small bowel with associated fluid levels, suspicious for mechanical small bowel obstruction. 2. Elevation of the right hemidiaphragm with associated right basilar atelectasis. Probable small right pleural effusion. Electronically Signed   By: Jeannine Boga M.D.   On: 01/13/2016 01:02    Labs:  CBC:  Recent Labs  01/07/16 1124 01/13/16 0005 01/13/16 0011 01/13/16 0601 01/14/16 0408  WBC 13.9* 12.3*  --  12.8* 10.5  HGB 15.4* 13.6 14.6 12.6 11.7*  HCT 44.8 39.2 43.0 35.9* 35.3*  PLT 294 324  --  318 288    COAGS: No results for input(s): INR, APTT in the last 8760 hours.  BMP:  Recent Labs  01/07/16 1124 01/13/16 0011 01/13/16 0601 01/14/16 0408  NA 139 131* 132* 133*  K 3.6 3.0* 3.2* 3.8  CL 104 88* 97* 100*  CO2 27  --  26 24  GLUCOSE 136* 121* 127* 98  BUN 10 7 8 6   CALCIUM 9.4  --  8.5* 8.0*  CREATININE 0.83  0.60 0.55 0.60  GFRNONAA >60  --  >60 >60  GFRAA >60  --  >60 >60    LIVER FUNCTION TESTS:  Recent Labs  01/07/16 1124  BILITOT 0.3  AST 25  ALT 20  ALKPHOS 67  PROT 7.5  ALBUMIN 4.5    TUMOR MARKERS: No results for input(s): AFPTM, CEA, CA199, CHROMGRNA in the last 8760 hours.   IMAGING:  CLINICAL DATA: Left lower quadrant pain. Evaluate abscess  EXAM: CT ABDOMEN AND PELVIS WITH CONTRAST  TECHNIQUE: Multidetector CT imaging of the abdomen and pelvis was performed using the standard protocol following bolus administration of intravenous contrast.  CONTRAST: 133mL ISOVUE-300 IOPAMIDOL (ISOVUE-300) INJECTION 61%  COMPARISON: 01/13/2016  FINDINGS: Lower chest: There is a small right pleural effusion identified. Atelectasis is identified within both lung bases right greater than left. No airspace consolidation.  Hepatobiliary: No focal liver  abnormality. Previous coli cystectomy. Increase caliber of the CBD measures 1 cm. No obstructing stone or mass.  Pancreas: No mass, inflammatory changes, or other significant abnormality.  Spleen: Negative  Adrenals/Urinary Tract: The adrenal glands are both normal. Normal appearance of both kidneys. Urinary bladder is unremarkable.  Stomach/Bowel: NG tube is within the stomach. Increase caliber of the small bowel loops, some of which have air-fluid levels. These measure up to 3.2 cm. Numerous colonic diverticula identified. Wall thickening and inflammation involving the sigmoid colon is again identified. Findings compatible with acute diverticulitis.  Vascular/Lymphatic: No pathologically enlarged lymph nodes. No evidence of abdominal aortic aneurysm.  Reproductive: Previous hysterectomy. No adnexal mass.  Other: Multiple fluid collections are identified within the pelvis. The largest is within the rectovaginal recess measuring 6 cm. Previously this measured 6.8 cm. Left lower quadrant small bowel interloop fluid collection measures 4.6 cm, image 65 of series 2. Previously 5.2 cm. Again identified is a cluster of small abscess ease adjacent to the sigmoid colon measuring 7.2 x 3.5 cm. Previously 6.8 x 3.2 cm. No significant pneumoperitoneum identified.  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 No aggressive lytic or sclerotic bone lesions noted.  IMPRESSION: 1. Again noted are changes of acute sigmoid diverticulitis involving the sigmoid colon. 2. Multiple fluid collections are identified within the pelvis. These are not significantly improved when compared with exam from 01/13/2016. The largest is in the rectovaginal recess measuring 6.8 cm. 3. Small bowel obstruction secondary to #1.   Electronically Signed  By: Kerby Moors M.D.  On: 01/15/2016 15:32   Assessment and Plan:  Acute diverticulitis with abdominal fluid  collections.  CT scan reviewed by Dr. Laurence Ferrari  Will proceed with CT guided drainage/drain placement today.  Risks and Benefits discussed with the patient including bleeding, infection, damage to adjacent structures, bowel perforation/fistula connection, and sepsis.  All of the patient's questions were answered, patient is agreeable to proceed. Consent signed and in chart.  Thank you for this interesting consult.  I greatly enjoyed meeting Traci Hudson and look forward to participating in their care.  A copy of this report was sent to the requesting provider on this date.  Electronically Signed: Murrell Redden PA-C 01/16/2016, 8:25 AM   I spent a total of 40 Minutes  in face to face in clinical consultation, greater than 50% of which was counseling/coordinating care for CT drainage of abdominal fluid collection

## 2016-01-16 NOTE — Procedures (Signed)
CT guided R pelvic abscess drain placement 53ml purulent out, sent for GS, C&S No complication No blood loss. See complete dictation in [facility]

## 2016-01-16 NOTE — Progress Notes (Signed)
Dr. Lucia Gaskins aware via phone pt nervous about upcoming procedure and concerned about not having home anti depressant in a week. See new order received for IV Ativan.

## 2016-01-16 NOTE — Progress Notes (Addendum)
[place] [facility]:  [phone] General Surgery Progress Note   LOS: 3 days  POD -     Assessment/Plan: 1. Diverticulitis  CT scan on 6/9 showed pelvic and LLQ abscess.  Will try perc drain.  Dr. Marlou Starks has spoken to Dr. Laurence Ferrari  On [facility] transgluteal drainage of abscess today.  Mack [place], was in room after procedure.]  2.  SBO secondary to diverticulitis  Has NGT  3. DVT prophylaxis - Lovenox   Active Problems:   SBO (small bowel obstruction) ([facility])   Subjective:  She feels better except for some LLQ pain. Unmarried.  Lives by self.  Objective:   Filed Vitals:   01/15/16 2100 01/16/16 0500  BP: 147/78 135/84  Pulse: 74 75  Temp: 98.1 F (36.7 C) 98.7 F (37.1 C)  Resp: 20 18     Intake/Output from previous day:  06/09 0701 - 06/10 0700 In: 5000 [I.V.:5000] Out: 3950 [Urine:3650; Emesis/NG output:300]  Intake/Output this shift:      Physical Exam:   General: WN WF who is alert and oriented.    HEENT: Normal. Pupils equal.  Has NGT .   Lungs: Clear   Abdomen: Mild distention.  Has a few BS   Lab Results:    Recent Labs  01/14/16 0408  WBC 10.5  HGB 11.7*  HCT 35.3*  PLT 288    BMET   Recent Labs  01/14/16 0408  NA 133*  K 3.8  CL 100*  CO2 24  GLUCOSE 98  BUN 6  CREATININE 0.60  CALCIUM 8.0*    PT/INR  No results for input(s): LABPROT, INR in the last 72 hours.  ABG  No results for input(s): PHART, HCO3 in the last 72 hours.  Invalid input(s): PCO2, PO2   Studies/Results:  Ct Abdomen Pelvis W Contrast  01/15/2016  CLINICAL DATA:  Left lower quadrant pain.  Evaluate abscess EXAM: CT ABDOMEN AND PELVIS WITH CONTRAST TECHNIQUE: Multidetector CT imaging of the abdomen and pelvis was performed using the standard protocol following bolus administration of intravenous contrast. CONTRAST:  130mL ISOVUE-300 IOPAMIDOL (ISOVUE-300) INJECTION 61% COMPARISON:  01/13/2016 FINDINGS: Lower chest: There is a small  right pleural effusion identified. Atelectasis is identified within both lung bases right greater than left. No airspace consolidation. Hepatobiliary: No focal liver abnormality. Previous coli cystectomy. Increase caliber of the CBD measures 1 cm. No obstructing stone or mass. Pancreas: No mass, inflammatory changes, or other significant abnormality. Spleen: Negative Adrenals/Urinary Tract: The adrenal glands are both normal. Normal appearance of both kidneys. Urinary bladder is unremarkable. Stomach/Bowel: NG tube is within the stomach. Increase caliber of the small bowel loops, some of which have air-fluid levels. These measure up to 3.2 cm. Numerous colonic diverticula identified. Wall thickening and inflammation involving the sigmoid colon is again identified. Findings compatible with acute diverticulitis. Vascular/Lymphatic: No pathologically enlarged lymph nodes. No evidence of abdominal aortic aneurysm. Reproductive: Previous hysterectomy.  No adnexal mass. Other: Multiple fluid collections are identified within the pelvis. The largest is within the rectovaginal recess measuring 6 cm. Previously this measured 6.8 cm. Left lower quadrant small bowel interloop fluid collection measures 4.6 cm, image 65 of series 2. Previously 5.2 cm. Again identified is a cluster of small abscess ease adjacent to the sigmoid colon measuring 7.2 x 3.5 cm. Previously 6.8 x 3.2 cm. No significant pneumoperitoneum identified. 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 No aggressive lytic or sclerotic bone lesions noted. IMPRESSION: 1. Again noted are  changes of acute sigmoid diverticulitis involving the sigmoid colon. 2. Multiple fluid collections are identified within the pelvis. These are not significantly improved when compared with exam from 01/13/2016. The largest is in the rectovaginal recess measuring 6.8 cm. 3. Small bowel obstruction secondary to #1. Electronically Signed   By: Kerby Moors M.D.    On: 01/15/2016 15:32   Dg Abd 2 Views  01/14/2016  CLINICAL DATA:  Small bowel obstruction. EXAM: ABDOMEN - 2 VIEW COMPARISON:  01/13/2016 FINDINGS: The small bowel loops are diffusely dilated and measure up to 4 cm. No free intraperitoneal air identified. Nasogastric tube appears coiled in the stomach. IMPRESSION: Persistent abnormal bowel dilatation compatible with small bowel obstruction. This does not appear significantly improved from the previous exam. Electronically Signed   By: Kerby Moors M.D.   On: 01/14/2016 08:30     Anti-infectives:   Anti-infectives    Start     Dose/Rate Route Frequency Ordered Stop   01/15/16 2100  fluconazole (DIFLUCAN) IVPB 200 mg     200 mg 100 mL/hr over 60 Minutes Intravenous  Once 01/15/16 2049 01/15/16 2327   01/13/16 0530  piperacillin-tazobactam (ZOSYN) IVPB 3.375 g     3.375 g 12.5 mL/hr over 240 Minutes Intravenous Every 8 hours 01/13/16 0519        Alphonsa Overall, MD, FACS Pager: [facility] [place] [facility] Office: [phone] 01/16/2016

## 2016-01-16 NOTE — Progress Notes (Signed)
Pt alert x4 c/o serve vaginal itching all day, order received for IV  Diflucan. Will continue to monitor.

## 2016-01-16 NOTE — Progress Notes (Signed)
MEDICATION RELATED CONSULT NOTE   IR Procedure Consult - Anticoagulant/Antiplatelet PTA/Inpatient Med List Review by Pharmacist    Procedure:   CT guided R pelvic abscess drain placement Completed: 01/16/16 @ 12:57 Post-Procedural bleeding risk per IR MD assessment:  Standard  Antithrombotic medications on inpatient or PTA profile prior to procedure:   Lovenox 40mg  for VTE px    Recommended restart time per IR Post-Procedure Guidelines:  Tomorrow morning   Other considerations:   none   Plan:     Resume Lovenox 40mg  sq q24h at 10am on 11Jun2017.    Netta Cedars, PharmD, BCPS Pager: [phone] 01/16/2016@3 :54 PM

## 2016-01-17 LAB — CBC WITH DIFFERENTIAL/PLATELET
BASOS PCT: 0 %
Basophils Absolute: 0 10*3/uL (ref 0.0–0.1)
Eosinophils Absolute: 0.1 10*3/uL (ref 0.0–0.7)
Eosinophils Relative: 1 %
HCT: 36 % (ref 36.0–46.0)
HEMOGLOBIN: 12.1 g/dL (ref 12.0–15.0)
LYMPHS ABS: 1.4 10*3/uL (ref 0.7–4.0)
Lymphocytes Relative: 11 %
MCH: 27.8 pg (ref 26.0–34.0)
MCHC: 33.6 g/dL (ref 30.0–36.0)
MCV: 82.6 fL (ref 78.0–100.0)
MONOS PCT: 5 %
Monocytes Absolute: 0.7 10*3/uL (ref 0.1–1.0)
NEUTROS ABS: 10.4 10*3/uL — AB (ref 1.7–7.7)
NEUTROS PCT: 82 %
PLATELETS: 340 10*3/uL (ref 150–400)
RBC: 4.36 MIL/uL (ref 3.87–5.11)
RDW: 13.6 % (ref 11.5–15.5)
WBC: 12.7 10*3/uL — AB (ref 4.0–10.5)

## 2016-01-17 LAB — BASIC METABOLIC PANEL
ANION GAP: 12 (ref 5–15)
BUN: <5 mg/dL — ABNORMAL LOW (ref 6–20)
CALCIUM: 8.4 mg/dL — AB (ref 8.9–10.3)
CHLORIDE: 105 mmol/L (ref 101–111)
CO2: 18 mmol/L — AB (ref 22–32)
CREATININE: 0.69 mg/dL (ref 0.44–1.00)
GFR calc Af Amer: >60 mL/min (ref >60)
Glucose, Bld: 81 mg/dL (ref 65–99)
POTASSIUM: 4.3 mmol/L (ref 3.5–5.1)
SODIUM: 135 mmol/L (ref 135–145)

## 2016-01-17 NOTE — Progress Notes (Signed)
[place] [facility] Office:  [phone] General Surgery Progress Note   LOS: 4 days  POD -     Assessment/Plan: 1. Diverticulitis  CT scan on 6/9 showed pelvic and LLQ abscess.  On Zosyn - 6/7 >>>  R transgluteal drainage of abscess - 01/16/2016 - D. Hassell  WBC - 12,700 - 01/17/2016  2.  SBO secondary to diverticulitis  Has NGT  She has had a couple BM's.  I removed her NGT.  She needs to ambulate more.  3. DVT prophylaxis - Lovenox   Active Problems:   SBO (small bowel obstruction) (HCC)   Subjective:  She is feeling better.  She had a large BM.  She had a BM yesterday.  No nausea.  Unmarried.  Lives by self.  Objective:   Filed Vitals:   01/17/16 0122 01/17/16 0449  BP: 148/80 156/86  Pulse: 71 91  Temp: 98.9 F (37.2 C) 99.1 F (37.3 C)  Resp: 16 16     Intake/Output from previous day:  06/10 0701 - 06/11 0700 In: 1465.4 [P.O.:30; I.V.:1435.4] Out: 2255 [Urine:1900; Emesis/NG output:325; Drains:30]  Intake/Output this shift:      Physical Exam:   General: WN WF who is alert and oriented.    HEENT: Normal. Pupils equal.  Has NGT .   Lungs: Clear   Abdomen: Mild distention.  Has BS. Drain right buttocks - 30 cc last 24 hours   Lab Results:     Recent Labs  01/17/16 0443  WBC 12.7*  HGB 12.1  HCT 36.0  PLT 340    BMET    Recent Labs  01/17/16 0443  NA 135  K 4.3  CL 105  CO2 18*  GLUCOSE 81  BUN <5*  CREATININE 0.69  CALCIUM 8.4*    PT/INR  No results for input(s): LABPROT, INR in the last 72 hours.  ABG  No results for input(s): PHART, HCO3 in the last 72 hours.  Invalid input(s): PCO2, PO2   Studies/Results:  Ct Abdomen Pelvis W Contrast  01/15/2016  CLINICAL DATA:  Left lower quadrant pain.  Evaluate abscess EXAM: CT ABDOMEN AND PELVIS WITH CONTRAST TECHNIQUE: Multidetector CT imaging of the abdomen and pelvis was performed using the standard protocol following bolus administration of intravenous contrast. CONTRAST:   185mL ISOVUE-300 IOPAMIDOL (ISOVUE-300) INJECTION 61% COMPARISON:  01/13/2016 FINDINGS: Lower chest: There is a small right pleural effusion identified. Atelectasis is identified within both lung bases right greater than left. No airspace consolidation. Hepatobiliary: No focal liver abnormality. Previous coli cystectomy. Increase caliber of the CBD measures 1 cm. No obstructing stone or mass. Pancreas: No mass, inflammatory changes, or other significant abnormality. Spleen: Negative Adrenals/Urinary Tract: The adrenal glands are both normal. Normal appearance of both kidneys. Urinary bladder is unremarkable. Stomach/Bowel: NG tube is within the stomach. Increase caliber of the small bowel loops, some of which have air-fluid levels. These measure up to 3.2 cm. Numerous colonic diverticula identified. Wall thickening and inflammation involving the sigmoid colon is again identified. Findings compatible with acute diverticulitis. Vascular/Lymphatic: No pathologically enlarged lymph nodes. No evidence of abdominal aortic aneurysm. Reproductive: Previous hysterectomy.  No adnexal mass. Other: Multiple fluid collections are identified within the pelvis. The largest is within the rectovaginal recess measuring 6 cm. Previously this measured 6.8 cm. Left lower quadrant small bowel interloop fluid collection measures 4.6 cm, image 65 of series 2. Previously 5.2 cm. Again identified is a cluster of small abscess ease adjacent to the sigmoid colon measuring 7.2 x  3.5 cm. Previously 6.8 x 3.2 cm. No significant pneumoperitoneum identified. 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 No aggressive lytic or sclerotic bone lesions noted. IMPRESSION: 1. Again noted are changes of acute sigmoid diverticulitis involving the sigmoid colon. 2. Multiple fluid collections are identified within the pelvis. These are not significantly improved when compared with exam from 01/13/2016. The largest is in the rectovaginal  recess measuring 6.8 cm. 3. Small bowel obstruction secondary to #1. Electronically Signed   By: Kerby Moors M.D.   On: 01/15/2016 15:32     Anti-infectives:   Anti-infectives    Start     Dose/Rate Route Frequency Ordered Stop   01/15/16 2100  fluconazole (DIFLUCAN) IVPB 200 mg     200 mg 100 mL/hr over 60 Minutes Intravenous  Once 01/15/16 2049 01/15/16 2327   01/13/16 0530  piperacillin-tazobactam (ZOSYN) IVPB 3.375 g     3.375 g 12.5 mL/hr over 240 Minutes Intravenous Every 8 hours 01/13/16 0519        Alphonsa Overall, MD, FACS Pager: [facility]: [phone] 01/17/2016

## 2016-01-18 LAB — CBC WITH DIFFERENTIAL/PLATELET
BASOS PCT: 0 %
Basophils Absolute: 0 10*3/uL (ref 0.0–0.1)
EOS PCT: 2 %
Eosinophils Absolute: 0.2 10*3/uL (ref 0.0–0.7)
HEMATOCRIT: 34.8 % — AB (ref 36.0–46.0)
HEMOGLOBIN: 11.8 g/dL — AB (ref 12.0–15.0)
LYMPHS PCT: 17 %
Lymphs Abs: 1.7 10*3/uL (ref 0.7–4.0)
MCH: 27.6 pg (ref 26.0–34.0)
MCHC: 33.9 g/dL (ref 30.0–36.0)
MCV: 81.3 fL (ref 78.0–100.0)
MONOS PCT: 5 %
Monocytes Absolute: 0.5 10*3/uL (ref 0.1–1.0)
NEUTROS ABS: 7.6 10*3/uL (ref 1.7–7.7)
Neutrophils Relative %: 76 %
Platelets: 273 10*3/uL (ref 150–400)
RBC: 4.28 MIL/uL (ref 3.87–5.11)
RDW: 13.6 % (ref 11.5–15.5)
WBC: 10 10*3/uL (ref 4.0–10.5)

## 2016-01-18 LAB — BASIC METABOLIC PANEL
Anion gap: 7 (ref 5–15)
CHLORIDE: 108 mmol/L (ref 101–111)
CO2: 22 mmol/L (ref 22–32)
CREATININE: 0.58 mg/dL (ref 0.44–1.00)
Calcium: 8.4 mg/dL — ABNORMAL LOW (ref 8.9–10.3)
GFR calc Af Amer: >60 mL/min (ref >60)
GFR calc non Af Amer: >60 mL/min (ref >60)
Glucose, Bld: 111 mg/dL — ABNORMAL HIGH (ref 65–99)
Potassium: 3.9 mmol/L (ref 3.5–5.1)
Sodium: 137 mmol/L (ref 135–145)

## 2016-01-18 LAB — PREALBUMIN: Prealbumin: 8.8 mg/dL — ABNORMAL LOW (ref 18–38)

## 2016-01-18 MED ORDER — TRAMADOL HCL 50 MG PO TABS
100.0000 mg | ORAL_TABLET | Freq: Four times a day (QID) | ORAL | Status: DC | PRN
  Administered 2016-01-19 – 2016-01-21 (×6): 100 mg via ORAL
  Filled 2016-01-18 (×7): qty 2

## 2016-01-18 MED ORDER — SERTRALINE HCL 50 MG PO TABS
50.0000 mg | ORAL_TABLET | Freq: Every day | ORAL | Status: DC
  Administered 2016-01-18 – 2016-01-22 (×5): 50 mg via ORAL
  Filled 2016-01-18 (×5): qty 1

## 2016-01-18 MED ORDER — SACCHAROMYCES BOULARDII 250 MG PO CAPS
250.0000 mg | ORAL_CAPSULE | Freq: Two times a day (BID) | ORAL | Status: DC
  Administered 2016-01-18 – 2016-01-22 (×9): 250 mg via ORAL
  Filled 2016-01-18 (×9): qty 1

## 2016-01-18 NOTE — Care Management Note (Signed)
Case Management Note  Patient Details  Name: Traci Hudson MRN: [id number] Date of Birth: 1956-06-28  Subjective/Objective:         Diverticulitis            Action/Plan:Date:  January 18, 2016 Chart reviewed for concurrent status and case management needs. Will continue to follow the patient for changes and needs: Expected discharge date: [id number] Velva Harman, BSN, [place], [place]   Expected Discharge Date:  01/15/16               Expected Discharge Plan:  Home/Self Care  In-House Referral:  NA  Discharge planning Services  CM Consult  Post Acute Care Choice:  NA Choice offered to:  NA  DME Arranged:    DME Agency:     HH Arranged:    HH Agency:     Status of Service:  Completed, signed off  Medicare Important Message Given:    Date Medicare IM Given:    Medicare IM give by:    Date Additional Medicare IM Given:    Additional Medicare Important Message give by:     If discussed at [place] of Stay Meetings, dates discussed:    Additional Comments:  Leeroy Cha, RN 01/18/2016, 11:13 AM

## 2016-01-18 NOTE — Progress Notes (Signed)
Date:  June12, 2017 Chart reviewed for concurrent status and case management needs. Will continue to follow the patient for changes and needs: Expected discharge date: [id number] Rhonda Davis, BSN, RN3, CCM   [phone] 

## 2016-01-18 NOTE — Progress Notes (Signed)
Subjective: She is feeling much better, she had diffuse pain at admission, now she is saying it is better and in the LLQ, pain in buttocks at drain site.  This is draining a purulent white-pink tinged fluid.    Objective: Vital signs in last 24 hours: Temp:  [98.4 F (36.9 C)-99 F (37.2 C)] 98.4 F (36.9 C) (06/12 0609) Pulse Rate:  [70-82] 73 (06/12 0609) Resp:  [16] 16 (06/12 0609) BP: (140-157)/(74-86) 140/74 mmHg (06/12 0609) SpO2:  [95 %-99 %] 97 % (06/12 0609) Last BM Date: 01/17/16 BM x 7 Voided x 6 Drain 25 ml recorded Afebrile, VSS Labs OK,   Intake/Output from previous day: 06/11 0701 - 06/12 0700 In: 3525 [P.O.:420; I.V.:3000; IV Piggyback:100] Out: 25 [Drains:25] Intake/Output this shift:    General appearance: alert, cooperative, no distress and still sore and tender RLQ Resp: clear to auscultation bilaterally GI: soft, pain LLQ, otherwise soft and pain free.  Drain as noted above,  + BS, multiple stools yesterday.  Lab Results:   Recent Labs  01/17/16 0443 01/18/16 0352  WBC 12.7* 10.0  HGB 12.1 11.8*  HCT 36.0 34.8*  PLT 340 273    BMET  Recent Labs  01/17/16 0443 01/18/16 0352  NA 135 137  K 4.3 3.9  CL 105 108  CO2 18* 22  GLUCOSE 81 111*  BUN <5* <5*  CREATININE 0.69 0.58  CALCIUM 8.4* 8.4*   PT/INR No results for input(s): LABPROT, INR in the last 72 hours.  No results for input(s): AST, ALT, ALKPHOS, BILITOT, PROT, ALBUMIN in the last 168 hours.   Lipase  No results found for: LIPASE   Studies/Results: Ct Image Guided Drainage Percut Cath  Peritoneal Retroperit  01/18/2016  CLINICAL DATA:  Diverticulitis with dominant cul-de-sac abscess. Multiple smaller extraluminal collections adjacent to the sigmoid colon, largest locule less than 2 cm, too small to accomodate drain catheter at this time. EXAM: CT GUIDED DRAINAGE OF pelvic ABSCESS ANESTHESIA/SEDATION: Intravenous Fentanyl and Versed were administered as conscious sedation  during continuous monitoring of the patient's level of consciousness and physiological / cardiorespiratory status by the radiology RN, with a total moderate sedation time of 24 minutes. PROCEDURE: The procedure, risks, benefits, and alternatives were explained to the patient. Questions regarding the procedure were encouraged and answered. The patient understands and consents to the procedure. Patient placed prone. Select axial scans through the lower pelvis obtained. The collection was localized an appropriate skin entry site was determined and marked. The operative field was prepped with chlorhexidinein a sterile fashion, and a sterile drape was applied covering the operative field. A sterile gown and sterile gloves were used for the procedure. Local anesthesia was provided with 1% Lidocaine. Under CT fluoroscopic guidance, percutaneous entry needle advanced into the collection. Purulent fluid returned. Amplatz wire advanced easily within the collection, its position confirmed on CT fluoro. Tract dilated to facilitate placement of a 12 French pigtail catheter, formed centrally within the collection. Approximately 20 mL of purulent material were aspirated, sent for Gram stain, culture and sensitivity. Catheter secured externally with 0 Prolene suture and StatLock and placed to gravity bag. The patient tolerated the procedure well. COMPLICATIONS: None immediate FINDINGS: The dominant cul-de-sac fluid collection was again localized. Drain catheter placed via right trans gluteal approach. 20 mL sample of the purulent aspirate sent for Gram stain, culture and sensitivity. IMPRESSION: 1. Technically successful CT-guided pelvic abscess drain catheter placement. Electronically Signed   By: Lucrezia Europe M.D.   On: 01/18/2016  08:21   Prior to Admission medications   Medication Sig Start Date End Date Taking? Authorizing Provider  ARNUITY ELLIPTA 200 MCG/ACT AEPB Inhale 1 puff into the lungs daily. 12/26/15  Yes Historical  Provider, MD  ciprofloxacin (CIPRO) 500 MG tablet Take 1 tablet (500 mg total) by mouth 2 (two) times daily. 01/07/16  Yes Carlisle Cater, PA-C  fexofenadine (ALLEGRA) 180 MG tablet Take 180 mg by mouth daily.  11/17/15  Yes Historical Provider, MD  fluticasone (FLONASE) 50 MCG/ACT nasal spray Place 1 spray into both nostrils daily.  10/22/15  Yes Historical Provider, MD  metroNIDAZOLE (FLAGYL) 500 MG tablet Take 1 tablet (500 mg total) by mouth 3 (three) times daily. 01/07/16  Yes Carlisle Cater, PA-C  Multiple Vitamin (MULTIVITAMIN WITH MINERALS) TABS tablet Take 1 tablet by mouth daily.   Yes Historical Provider, MD  omeprazole (PRILOSEC) 20 MG capsule Take 20 mg by mouth daily as needed (acid relux).    Yes Historical Provider, MD  ondansetron (ZOFRAN ODT) 4 MG disintegrating tablet Take 1 tablet (4 mg total) by mouth every 8 (eight) hours as needed for nausea or vomiting. 01/07/16  Yes Carlisle Cater, PA-C  sertraline (ZOLOFT) 100 MG tablet Take 50 mg by mouth daily. 11/09/15  Yes Historical Provider, MD  traMADol (ULTRAM) 50 MG tablet Take 100 mg by mouth every 6 (six) hours as needed for moderate pain or severe pain.   Yes Historical Provider, MD  zolpidem (AMBIEN) 5 MG tablet Take 2.5 mg by mouth at bedtime as needed for sleep.    Yes Historical Provider, MD  naproxen sodium (ANAPROX) 220 MG tablet Take 220 mg by mouth 2 (two) times daily as needed (pain).    Historical Provider, MD  PROAIR HFA 108 (90 BASE) MCG/ACT inhaler Inhale 2 puffs into the lungs every 4 (four) hours as needed for wheezing or shortness of breath.  12/21/14   Historical Provider, MD  valACYclovir (VALTREX) 1000 MG tablet Take 1 g by mouth every 12 (twelve) hours as needed (outbreaks).  11/09/15   Historical Provider, MD     Medications: . budesonide (PULMICORT) nebulizer solution  0.5 mg Nebulization BID  . enoxaparin (LOVENOX) injection  40 mg Subcutaneous Q24H  . famotidine (PEPCID) IV  20 mg Intravenous Q12H  .  piperacillin-tazobactam (ZOSYN)  IV  3.375 g Intravenous Q8H   . 0.9 % NaCl with KCl 20 mEq / L 125 mL/hr at 01/18/16 0232   AODM Hypertension Insomnia Hypercholesteremia Asthma  Assessment/Plan Diverticulitis with abscess  Trans gluteal drain 01/16/16 IR SBO secondary to diverticular disease - better Prior hx of vesicovaginal fistula with hysterectomy/appendectomy FEN:  IV fluids/NPO ID:  Day 5 Zosyn (started 01/13/2016),  DVT:  Lovenox/SCD  Plan:  Clears, restart Zoloft and add probiotic.     LOS: 5 days    JENNINGS,WILLARD 01/18/2016 [phone]  Agree with above. Overall, seems slowly better.  Alphonsa Overall, MD, [facility] Pager: [phone] Office phone:  [phone]

## 2016-01-19 NOTE — Progress Notes (Addendum)
  Subjective: She is doing better, still having pain LLQ and worse with ambulation.  She is doing well with clears, but has some nausea on and off.  Having some now.   Objective: Vital signs in last 24 hours: Temp:  [98.1 F (36.7 C)-98.6 F (37 C)] 98.1 F (36.7 C) (06/13 0644) Pulse Rate:  [64-74] 74 (06/13 0644) Resp:  [18] 18 (06/13 0644) BP: (117-138)/(66-84) 117/66 mmHg (06/13 0644) SpO2:  [98 %] 98 % (06/13 0644) Last BM Date: 01/17/16 1320 PO Urine 1500 Drain 30 ml Stool x 3 Afebrile, VSS Labs OK yesterday   Intake/Output from previous day: 06/12 0701 - 06/13 0700 In: 4825 [P.O.:1320; I.V.:3000; IV Piggyback:500] Out: 1530 [Urine:1500; Drains:30] Intake/Output this shift:    General appearance: alert, cooperative and no distress Resp: clear to auscultation bilaterally GI: soft, ongoing tenderness/pain  LLQ  Lab Results:   Recent Labs  01/17/16 0443 01/18/16 0352  WBC 12.7* 10.0  HGB 12.1 11.8*  HCT 36.0 34.8*  PLT 340 273    BMET  Recent Labs  01/17/16 0443 01/18/16 0352  NA 135 137  K 4.3 3.9  CL 105 108  CO2 18* 22  GLUCOSE 81 111*  BUN <5* <5*  CREATININE 0.69 0.58  CALCIUM 8.4* 8.4*   PT/INR No results for input(s): LABPROT, INR in the last 72 hours.  No results for input(s): AST, ALT, ALKPHOS, BILITOT, PROT, ALBUMIN in the last 168 hours.   Lipase  No results found for: LIPASE   Studies/Results: No results found.  Medications: . budesonide (PULMICORT) nebulizer solution  0.5 mg Nebulization BID  . enoxaparin (LOVENOX) injection  40 mg Subcutaneous Q24H  . famotidine (PEPCID) IV  20 mg Intravenous Q12H  . piperacillin-tazobactam (ZOSYN)  IV  3.375 g Intravenous Q8H  . saccharomyces boulardii  250 mg Oral BID  . sertraline  50 mg Oral Daily   . 0.9 % NaCl with KCl 20 mEq / L 125 mL/hr (01/19/16 0323)   AODM Hypertension Insomnia Hypercholesteremia Asthma Assessment/Plan Diverticulitis with abscess Trans  gluteal drain 01/16/16 IR SBO secondary to diverticular disease - better Prior hx of vesicovaginal fistula with hysterectomy/appendectomy FEN: IV fluids/clears ID: Day 8 Zosyn (started 01/12/2016),  DVT: Lovenox/SCD  Plan:  Continue current Rx, follow labs in AM.   LOS: 6 days   JENNINGS,WILLARD 01/19/2016 [phone]  Agree with above. She seems to be doing better.   There is only 30 cc recorded from her pelvic drain.  Her LLQ pain is better.  Her WBC is in the normal range.   She did have some nausea this AM.  Alphonsa Overall, MD, [facility] Surgery Pager: [phone] Office phone:  [phone]

## 2016-01-20 ENCOUNTER — Telehealth: Payer: Self-pay | Admitting: Internal Medicine

## 2016-01-20 LAB — BASIC METABOLIC PANEL
Anion gap: 4 — ABNORMAL LOW (ref 5–15)
CHLORIDE: 107 mmol/L (ref 101–111)
CO2: 27 mmol/L (ref 22–32)
Calcium: 8.3 mg/dL — ABNORMAL LOW (ref 8.9–10.3)
Creatinine, Ser: 0.62 mg/dL (ref 0.44–1.00)
GFR calc Af Amer: >60 mL/min (ref >60)
GFR calc non Af Amer: >60 mL/min (ref >60)
GLUCOSE: 98 mg/dL (ref 65–99)
POTASSIUM: 3.9 mmol/L (ref 3.5–5.1)
SODIUM: 138 mmol/L (ref 135–145)

## 2016-01-20 LAB — CBC
HCT: 32.9 % — ABNORMAL LOW (ref 36.0–46.0)
HEMOGLOBIN: 11.3 g/dL — AB (ref 12.0–15.0)
MCH: 27.9 pg (ref 26.0–34.0)
MCHC: 34.3 g/dL (ref 30.0–36.0)
MCV: 81.2 fL (ref 78.0–100.0)
Platelets: 263 10*3/uL (ref 150–400)
RBC: 4.05 MIL/uL (ref 3.87–5.11)
RDW: 13.5 % (ref 11.5–15.5)
WBC: 6.4 10*3/uL (ref 4.0–10.5)

## 2016-01-20 MED ORDER — METRONIDAZOLE IN NACL 5-0.79 MG/ML-% IV SOLN
500.0000 mg | Freq: Three times a day (TID) | INTRAVENOUS | Status: DC
  Administered 2016-01-20 – 2016-01-21 (×2): 500 mg via INTRAVENOUS
  Filled 2016-01-20: qty 100

## 2016-01-20 MED ORDER — DEXTROSE 5 % IV SOLN
2.0000 g | INTRAVENOUS | Status: DC
  Administered 2016-01-20: 2 g via INTRAVENOUS
  Filled 2016-01-20 (×2): qty 2

## 2016-01-20 NOTE — Telephone Encounter (Signed)
Called and spoke with CT-pt is currently admitted in hospital and is scheduled for CT tomorrow-Stacey has cancelled this and will have to be rescheduled after pt's discharge. Forwarding to MW as Juluis Rainier.

## 2016-01-20 NOTE — Progress Notes (Addendum)
  Subjective: She feels better this AM.   She has less discomfort RLQ, the drain has some milky colored fluid, not much and i cannot tell if it is old or new.    Objective: Vital signs in last 24 hours: Temp:  [98.4 F (36.9 C)-99 F (37.2 C)] 98.9 F (37.2 C) (06/14 0640) Pulse Rate:  [65-73] 65 (06/14 0640) Resp:  [18] 18 (06/14 0640) BP: (130-145)/(68-85) 135/85 mmHg (06/14 0640) SpO2:  [95 %-97 %] 95 % (06/14 0640) Last BM Date: 01/19/16 840 PO Urine 2150 Drain 5 ml BM x 2 Afebrile, VSS WBC 6.4 Intake/Output from previous day: 06/13 0701 - 06/14 0700 In: 3837.9 [P.O.:840; I.V.:2997.9] Out: 2155 [Urine:2150; Drains:5] Intake/Output this shift:    General appearance: alert, cooperative and no distress GI: soft, non-tender; bowel sounds normal; no masses,  no organomegaly  Lab Results:   Recent Labs  01/18/16 0352 01/20/16 0403  WBC 10.0 6.4  HGB 11.8* 11.3*  HCT 34.8* 32.9*  PLT 273 263    BMET  Recent Labs  01/18/16 0352 01/20/16 0403  NA 137 138  K 3.9 3.9  CL 108 107  CO2 22 27  GLUCOSE 111* 98  BUN <5* <5*  CREATININE 0.58 0.62  CALCIUM 8.4* 8.3*   PT/INR No results for input(s): LABPROT, INR in the last 72 hours.  No results for input(s): AST, ALT, ALKPHOS, BILITOT, PROT, ALBUMIN in the last 168 hours.   Lipase  No results found for: LIPASE   Studies/Results: No results found.  Medications: . budesonide (PULMICORT) nebulizer solution  0.5 mg Nebulization BID  . enoxaparin (LOVENOX) injection  40 mg Subcutaneous Q24H  . famotidine (PEPCID) IV  20 mg Intravenous Q12H  . piperacillin-tazobactam (ZOSYN)  IV  3.375 g Intravenous Q8H  . saccharomyces boulardii  250 mg Oral BID  . sertraline  50 mg Oral Daily   AODM Hypertension Insomnia Hypercholesteremia Asthma  Assessment/Plan Diverticulitis with abscess Trans gluteal drain 01/16/16 IR SBO secondary to diverticular disease - better Prior hx of vesicovaginal fistula  with hysterectomy/appendectomy FEN: IV fluids/clears ID: Day 9 Zosyn (started 01/12/2016), Pharmacy has made recommendations to change antibiotic, based on cultures that are coming out now.  Starting Rocephin and Flagyl for: VIRIDANS STREPTOCOCCUS DVT: Lovenox/SCD   Plan:  Full liquids today, and discuss plans for repeat CT and ongoing abx therapy. She has had drain in for 4 days today.  Decrease IV fluids.    LOS: 7 days    JENNINGS,WILLARD 01/20/2016 [phone]  Agree with above.  Antibiotics switched to Rocephin/flagyl for Strep viridans from pelvic abscess.  Doing well.  Consider switching to oral antibiotics tomorrow and home Friday.  Will have decide on timing of next CT scan.  Alphonsa Overall, MD, [facility] Surgery Pager: [phone] Office phone:  [phone]

## 2016-01-21 ENCOUNTER — Other Ambulatory Visit: Payer: Self-pay | Admitting: Radiology

## 2016-01-21 ENCOUNTER — Inpatient Hospital Stay: Admission: RE | Admit: 2016-01-21 | Payer: BLUE CROSS/BLUE SHIELD | Source: Ambulatory Visit

## 2016-01-21 DIAGNOSIS — IMO0002 Reserved for concepts with insufficient information to code with codable children: Secondary | ICD-10-CM

## 2016-01-21 DIAGNOSIS — K56609 Unspecified intestinal obstruction, unspecified as to partial versus complete obstruction: Secondary | ICD-10-CM

## 2016-01-21 LAB — AEROBIC/ANAEROBIC CULTURE (SURGICAL/DEEP WOUND)

## 2016-01-21 LAB — AEROBIC/ANAEROBIC CULTURE W GRAM STAIN (SURGICAL/DEEP WOUND)

## 2016-01-21 MED ORDER — METRONIDAZOLE 500 MG PO TABS
500.0000 mg | ORAL_TABLET | Freq: Three times a day (TID) | ORAL | Status: DC
  Administered 2016-01-21 – 2016-01-22 (×4): 500 mg via ORAL
  Filled 2016-01-21 (×4): qty 1

## 2016-01-21 MED ORDER — CEFUROXIME AXETIL 500 MG PO TABS
500.0000 mg | ORAL_TABLET | Freq: Two times a day (BID) | ORAL | Status: DC
  Administered 2016-01-21 – 2016-01-22 (×3): 500 mg via ORAL
  Filled 2016-01-21 (×5): qty 1

## 2016-01-21 MED ORDER — FAMOTIDINE 20 MG PO TABS
20.0000 mg | ORAL_TABLET | Freq: Two times a day (BID) | ORAL | Status: DC
  Administered 2016-01-21 – 2016-01-22 (×2): 20 mg via ORAL
  Filled 2016-01-21 (×2): qty 1

## 2016-01-21 MED ORDER — NAPROXEN SODIUM 275 MG PO TABS
275.0000 mg | ORAL_TABLET | Freq: Two times a day (BID) | ORAL | Status: DC | PRN
  Filled 2016-01-21: qty 1

## 2016-01-21 NOTE — Progress Notes (Signed)
Subjective: She looks good and is doing well.    Objective: Vital signs in last 24 hours: Temp:  [98.6 F (37 C)] 98.6 F (37 C) (06/15 0535) Pulse Rate:  [63-71] 63 (06/15 0535) Resp:  [18] 18 (06/15 0535) BP: (123-125)/(67-80) 123/80 mmHg (06/15 0535) SpO2:  [95 %-98 %] 96 % (06/15 0535) Last BM Date: 01/19/16 1080 PO Urine 2200 Stool x 1 Afebrile, VSS Labs good yesterday Intake/Output from previous day: 06/14 0701 - 06/15 0700 In: 2512.5 [P.O.:1080; I.V.:1432.5] Out: 2200 [Urine:2200] Intake/Output this shift:    General appearance: alert, cooperative and no distress Resp: clear to auscultation bilaterally GI: soft, non-tender; bowel sounds normal; no masses,  no organomegaly  Lab Results:   Recent Labs  01/20/16 0403  WBC 6.4  HGB 11.3*  HCT 32.9*  PLT 263    BMET  Recent Labs  01/20/16 0403  NA 138  K 3.9  CL 107  CO2 27  GLUCOSE 98  BUN <5*  CREATININE 0.62  CALCIUM 8.3*   PT/INR No results for input(s): LABPROT, INR in the last 72 hours.  No results for input(s): AST, ALT, ALKPHOS, BILITOT, PROT, ALBUMIN in the last 168 hours.   Lipase  No results found for: LIPASE   Studies/Results: No results found. Prior to Admission medications   Medication Sig Start Date End Date Taking? Authorizing Provider  ARNUITY ELLIPTA 200 MCG/ACT AEPB Inhale 1 puff into the lungs daily. 12/26/15  Yes Historical Provider, MD  ciprofloxacin (CIPRO) 500 MG tablet Take 1 tablet (500 mg total) by mouth 2 (two) times daily. 01/07/16  Yes Carlisle Cater, PA-C  fexofenadine (ALLEGRA) 180 MG tablet Take 180 mg by mouth daily.  11/17/15  Yes Historical Provider, MD  fluticasone (FLONASE) 50 MCG/ACT nasal spray Place 1 spray into both nostrils daily.  10/22/15  Yes Historical Provider, MD  metroNIDAZOLE (FLAGYL) 500 MG tablet Take 1 tablet (500 mg total) by mouth 3 (three) times daily. 01/07/16  Yes Carlisle Cater, PA-C  Multiple Vitamin (MULTIVITAMIN WITH MINERALS) TABS  tablet Take 1 tablet by mouth daily.   Yes Historical Provider, MD  omeprazole (PRILOSEC) 20 MG capsule Take 20 mg by mouth daily as needed (acid relux).    Yes Historical Provider, MD  ondansetron (ZOFRAN ODT) 4 MG disintegrating tablet Take 1 tablet (4 mg total) by mouth every 8 (eight) hours as needed for nausea or vomiting. 01/07/16  Yes Carlisle Cater, PA-C  sertraline (ZOLOFT) 100 MG tablet Take 50 mg by mouth daily. 11/09/15  Yes Historical Provider, MD  traMADol (ULTRAM) 50 MG tablet Take 100 mg by mouth every 6 (six) hours as needed for moderate pain or severe pain.   Yes Historical Provider, MD  zolpidem (AMBIEN) 5 MG tablet Take 2.5 mg by mouth at bedtime as needed for sleep.    Yes Historical Provider, MD  naproxen sodium (ANAPROX) 220 MG tablet Take 220 mg by mouth 2 (two) times daily as needed (pain).    Historical Provider, MD  PROAIR HFA 108 (90 BASE) MCG/ACT inhaler Inhale 2 puffs into the lungs every 4 (four) hours as needed for wheezing or shortness of breath.  12/21/14   Historical Provider, MD  valACYclovir (VALTREX) 1000 MG tablet Take 1 g by mouth every 12 (twelve) hours as needed (outbreaks).  11/09/15   Historical Provider, MD      Medications: . budesonide (PULMICORT) nebulizer solution  0.5 mg Nebulization BID  . cefTRIAXone (ROCEPHIN)  IV  2 g Intravenous Q24H  .  enoxaparin (LOVENOX) injection  40 mg Subcutaneous Q24H  . famotidine (PEPCID) IV  20 mg Intravenous Q12H  . metronidazole  500 mg Intravenous Q8H  . saccharomyces boulardii  250 mg Oral BID  . sertraline  50 mg Oral Daily   AODM Hypertension Insomnia Hypercholesteremia Asthma  Assessment/Plan Diverticulitis with abscess Trans gluteal drain 01/16/16 IR SBO secondary to diverticular disease - better Prior hx of vesicovaginal fistula with hysterectomy/appendectomy FEN: IV fluids/full liquids ID: Day 9 Zosyn (started 01/12/2016), Pharmacy has made recommendations to change antibiotic, based on  cultures that are coming out now. Starting Rocephin and Flagyl for: VIRIDANS STREPTOCOCCUS DVT: Lovenox/SCD   Plan:  We are asking Pharmacy to switch to 3rd generation oral cephalosporin.  Going to PO flagyl.  Saline lock and soft diet.  Arrange for follow up CT in the drain clinic with injection of the drain next week. Follow up with Dr.Aqil Goetting the following week.  If she does well aim for D/C tomorrow.    LOS: 8 days    JENNINGS,WILLARD 01/21/2016 [phone]  Agree with above. Looks good.  Friend in room. Aiming for discharge tomorrow with planned follow up for drain.  Alphonsa Overall, MD, [facility] Surgery Pager: [phone] Office phone:  [phone]

## 2016-01-21 NOTE — Progress Notes (Signed)
Key Points: Use following P&T approved IV to PO non-antibiotic change policy.  Description contains the criteria that are approved Note: Policy Excludes:  Esophagectomy patientsPHARMACIST - PHYSICIAN COMMUNICATION DR:   CCS CONCERNING: IV to Oral Route Change Policy  RECOMMENDATION: This patient is receiving pepcid by the intravenous route.  Based on criteria approved by the Pharmacy and Therapeutics Committee, the intravenous medication(s) is/are being converted to the equivalent oral dose form(s).   DESCRIPTION: These criteria include:  The patient is eating (either orally or via tube) and/or has been taking other orally administered medications for a least 24 hours  The patient has no evidence of active gastrointestinal bleeding or impaired GI absorption (gastrectomy, short bowel, patient on TNA or NPO).  If you have questions about this conversion, please contact the Pharmacy Department  []   [phone] )  [facility] []   [phone] )  [facility]  []   [phone] )  [facility] [x]   [phone] )  [place], Iselin, [facility] 01/21/2016 10:17 AM

## 2016-01-21 NOTE — Discharge Instructions (Signed)
Diverticulitis Diverticulitis is inflammation or infection of small pouches in your colon that form when you have a condition called diverticulosis. The pouches in your colon are called diverticula. Your colon, or large intestine, is where water is absorbed and stool is formed. Complications of diverticulitis can include:  Bleeding.  Severe infection.  Severe pain.  Perforation of your colon.  Obstruction of your colon. CAUSES  Diverticulitis is caused by bacteria. Diverticulitis happens when stool becomes trapped in diverticula. This allows bacteria to grow in the diverticula, which can lead to inflammation and infection. RISK FACTORS People with diverticulosis are at risk for diverticulitis. Eating a diet that does not include enough fiber from fruits and vegetables may make diverticulitis more likely to develop. SYMPTOMS  Symptoms of diverticulitis may include:  Abdominal pain and tenderness. The pain is normally located on the left side of the abdomen, but may occur in other areas.  Fever and chills.  Bloating.  Cramping.  Nausea.  Vomiting.  Constipation.  Diarrhea.  Blood in your stool. DIAGNOSIS  Your health care provider will ask you about your medical history and do a physical exam. You may need to have tests done because many medical conditions can cause the same symptoms as diverticulitis. Tests may include:  Blood tests.  Urine tests.  Imaging tests of the abdomen, including X-rays and CT scans. When your condition is under control, your health care provider may recommend that you have a colonoscopy. A colonoscopy can show how severe your diverticula are and whether something else is causing your symptoms. TREATMENT  Most cases of diverticulitis are mild and can be treated at home. Treatment may include:  Taking over-the-counter pain medicines.  Following a clear liquid diet.  Taking antibiotic medicines by mouth for 7-10 days. More severe cases may  be treated at a hospital. Treatment may include:  Not eating or drinking.  Taking prescription pain medicine.  Receiving antibiotic medicines through an IV tube.  Receiving fluids and nutrition through an IV tube.  Surgery. HOME CARE INSTRUCTIONS   Follow your health care provider's instructions carefully.  Follow a full liquid diet or other diet as directed by your health care provider. After your symptoms improve, your health care provider may tell you to change your diet. He or she may recommend you eat a high-fiber diet. Fruits and vegetables are good sources of fiber. Fiber makes it easier to pass stool.  Take fiber supplements or probiotics as directed by your health care provider.  Only take medicines as directed by your health care provider.  Keep all your follow-up appointments. SEEK MEDICAL CARE IF:   Your pain does not improve.  You have a hard time eating food.  Your bowel movements do not return to normal. SEEK IMMEDIATE MEDICAL CARE IF:   Your pain becomes worse.  Your symptoms do not get better.  Your symptoms suddenly get worse.  You have a fever.  You have repeated vomiting.  You have bloody or black, tarry stools. MAKE SURE YOU:   Understand these instructions.  Will watch your condition.  Will get help right away if you are not doing well or get worse.   This information is not intended to replace advice given to you by your health care provider. Make sure you discuss any questions you have with your health care provider.   Document Released: 05/04/2005 Document Revised: 07/30/2013 Document Reviewed: 06/19/2013 Elsevier Interactive Patient Education 2016 [employer].  Intestinal Pseudo-Obstruction (most likely from you diverticulitis) Intestinal  pseudo-obstruction is a condition that causes symptoms of a blockage in your intestinal tract without an actual obstruction. Your intestinal tract is made up of the hollow organs that digest your  food after the food leaves your stomach. Most digestion takes place in the upper part of the intestines (small intestine). Undigested food leaves your small intestine and passes into the lower part (large intestine). There, water is absorbed and bowel movements are formed. Intestinal pseudo-obstruction can take place anywhere along this tract. Intestinal pseudo-obstruction can be a short-term problem (acute) or a long-term (chronic) disease. CAUSES  Causes of intestinal pseudo-obstruction are categorized as primary or secondary:  Primary causes are abnormalities in the nerves and muscles that move food through the intestines. These abnormalities can be caused by gene defects (genetic mutations) that are passed down through families (inherited).  Secondary causes are problems resulting from other diseases or treatments that may affect the intestinal tract. These include:  Muscle and nervous system diseases.  Infections. - Yours most likely from the diverticulitis  Cancer or cancer treatments.  Medicines.  Surgery. In some cases, the cause of intestinal pseudo-obstruction cannot be found (idiopathic).  SIGNS AND SYMPTOMS  Symptoms can vary from person to person. The symptoms depend on the cause of the pseudo-obstruction and whether it is acute or chronic. Symptoms may include:  Abdominal pain, swelling, or bloating.  Nausea and vomiting.  Constipation or diarrhea.  Poor nutrition and weight loss. DIAGNOSIS  Intestinal pseudo-obstruction can be hard to diagnose because the condition can have many causes. Symptoms can also be similar to the symptoms of many other diseases. Your health care provider will ask about your medical history and do a physical exam. Most people need to have several exams. You may also need to see a health care provider who specializes in the digestive tract (gastroenterologist). Tests may be done to confirm the diagnosis. These may include:  Blood tests.  Exams  to look into the small intestine or the large intestine (endoscopy or colonoscopy).  Taking an X-ray of the digestive tract after a substance has been swallowed that is easy to see on an X-ray (barium study).  CT scan of the digestive tract.  Placing a tube into the intestine to measure pressure (manometry).  Removing a small piece of tissue from the intestinal wall to be examined under a microscope (biopsy). TREATMENT  Treatment depends on the cause of your pseudo-obstruction. For instance, you may need to be treated for another disease. You might need to change a medicine you are taking. If your pseudo-obstruction is caused by an infection, you may be given antibiotic medicine. Other possible treatments include:  Placing a tube (colonoscope) into the large intestine to remove gas.  Placing a tube (nasogastric tube) through the nose and down into the small intestine to remove gas or fluids.  Placing a tube (feeding tube) into the stomach or small intestine for liquid nutrition.  Medicines for:  Pain.  Nausea.  Diarrhea.  Constipation.  Stimulating intestinal muscle movements.  Surgery to remove part of the intestine. This is rare and only needed for severe cases not helped by other treatments. HOME CARE INSTRUCTIONS  Take medicines only as directed by your health care provider.  If you were prescribed an antibiotic medicine, finish it all even if you start to feel better.  Make any changes to your diet or eating habits recommended by your health care provider. This might include:  Eating smaller meals more often. Try having five or  six smaller meals each day instead of three large meals.  Eating pureed foods or liquid food supplements. This may ease symptoms.  Taking vitamin and mineral supplements to help prevent poor nutrition. Ask your health care provider to recommend a multivitamin.  Keep all follow-up visits as directed by your health care provider. This is  important. SEEK MEDICAL CARE IF: Your symptoms do not go away or get worse. MAKE SURE YOU:  Understand these instructions.  Will watch your condition.  Will get help right away if you are not doing well or get worse.   This information is not intended to replace advice given to you by your health care provider. Make sure you discuss any questions you have with your health care provider.   Document Released: 05/22/2009 Document Revised: 08/15/2014 Document Reviewed: 10/30/2013 Elsevier Interactive Patient Education 2016 Elsevier Inc.  Percutaneous Abscess Drain An abscess is a collection of infected fluid inside the body. Your health care provider may decide to remove or drain the infected fluid from the area by placing a thin needle into the abscess. Usually, a small tube is left in place to drain the abscess fluid. The abscess fluid may take a few days to drain. LET [facility] CARE PROVIDER KNOW ABOUT:  Any allergies you have.  All medicines you are taking, including vitamins, herbs, eye drops, creams, and over-the-counter medicines. This includes steroid medicines by mouth or cream.  Previous problems you or members of your family have had with the use of anesthetics.  Any blood disorders you have.  Previous surgeries you have had.  Possibility of pregnancy, if this applies.  Medical conditions you have.  Any history of smoking. RISKS AND COMPLICATIONS Generally, this is a safe procedure. However, problems can occur and include:   Infection.  Allergic reaction to materials used (such as contrast dye).  Damage to a nearby organ or tissue.  Bleeding.  Blockage of a tube placed to drain the abscess, requiring placement of a new drainage tube.  A need to repeat the procedure.  Failure of the procedure to adequately drain the abscess, requiring an open surgical procedure to do so. BEFORE THE PROCEDURE   Ask your health care provider about:  Changing or stopping  your regular medicines. This is especially important if you are taking diabetes medicines or blood thinners.  Taking medicines such as aspirin and ibuprofen. These medicines can thin your blood. Do not take these medicines before your procedure if your health care provider asks you not to.  Your health care provider may do some blood or urine tests. These will help your health care provider learn how well your kidneys and liver are working and how well your blood clots.  Do not eat or drink anything after midnight on the night before the procedure or as directed by your health care provider.  Make arrangements for someone to drive you home after the procedure.  PROCEDURE   An IV tube will be placed in your arm. Medicine will be able to flow directly into your body through this tube.  You will lie on an X-ray table.  Your heart rate, blood pressure, and breathing will be monitored.   Your oxygen level will also be watched during the procedure. Supplemental oxygen may be given if necessary.  The skin around the area where the drainage tube (catheter) will be placed will be cleaned and numbed.  A small cut (incision) will then be made to insert the drainage tube. The drainage  tube will be inserted using X-ray or CT scan to help direct where it should be placed.  The drainage tube will be guided into the abscess to drain the infected fluid.  The drainage tube may stay in place and be connected to a bag outside your body. It will stay until the fluid has stopped draining and the infection is gone. AFTER THE PROCEDURE  You will be taken to a recovery area where you will stay until the medicines have worn off.  You will stay in bed for several hours.  Your progress will be monitored.   Your blood pressure and pulse will be checked often.   The area of the incision will be checked often.  You may have some pain or feel sick. Tell your health care provider.  As you begin to feel  better, you may be given ice, fluids, and food.   When you can walk, drink, eat, and use the bathroom, you may be able to go home.   This information is not intended to replace advice given to you by your health care provider. Make sure you discuss any questions you have with your health care provider.   Document Released: 12/09/2013 Document Reviewed: 12/09/2013 Elsevier Interactive Patient Education [employer].

## 2016-01-21 NOTE — Progress Notes (Signed)
Patient vomited moderate amount of lunch. Patient was given medication. Will continue to monitor patient.

## 2016-01-21 NOTE — Care Management Note (Signed)
Case Management Note  Patient Details  Name: Traci Hudson MRN: [id number] Date of Birth: Jul 09, 1956  Subjective/Objective:                  SBO (small bowel obstruction)  Action/Plan: Discharge planning Expected Discharge Date: 01/22/16               Expected Discharge Plan:  [place]  In-House Referral:     Discharge planning Services  CM Consult  Post Acute Care Choice:  Home Health Choice offered to:  Patient  DME Arranged:    DME Agency:  [place] Arranged:  RN [facility] Agency:  [place]  Status of Service:  Completed, signed off  Medicare Important Message Given:    Date Medicare IM Given:    Medicare IM give by:    Date Additional Medicare IM Given:    Additional Medicare Important Message give by:     If discussed at [place] of Stay Meetings, dates discussed:    Additional Comments: Cm met with pt for choice of home health agency. Pt has transgluteal drain and HHRN order for mgmt of drain.  Pt chooses [facility] to [facility] services.  CM called [facility] rep, Santiago Glad with referral.  No other CM needs were communicated. Dellie Catholic, RN 01/21/2016, 2:54 PM

## 2016-01-21 NOTE — Discharge Summary (Signed)
Physician Discharge Summary  Patient ID: Traci Hudson MRN: [id number] DOB/AGE: 09/08/1955 76 y.o.  Admit date: 01/12/2016 Discharge date: 01/22/2016  Admission Diagnoses:  Sigmoid diverticulitis with SBO AODM Hypertension Hx of Asthma Dyslipidemia  Discharge Diagnoses:  Diverticulitis with abscess SBO secondary to diverticular disease - resolved Prior hx of vesicovaginal fistula with hysterectomy/appendectomy AODM Hypertension Insomnia Hypercholesteremia Asthma  Active Problems:   SBO (small bowel obstruction) (HCC)   PROCEDURES: IR drainage of pelvic abscess 01/16/16  Hospital Course:  Patient is a 60 year old female With a previous history of diverticulitis. She says this has been treated on several occasions in the past was requiring hospitalization. Last time was about 5 years ago. The patient presented to the emergency department 6 days agowith the acute onset that day of left lower quadrant abdominal pain. This is similar to her previous episodes of diverticulitis. A CT scan at that time was obtained which I have reviewed Showing extensive diverticulosis and apparent uncomplicated Sigmoid diverticulitis. The patient was placed on oral antibiotics and discharged for outpatient follow-up. She states that she did not really improve from that point on. Continued to have lower abdominal pain and some nausea. Initially was constipated but a couple of days ago began having bowel movements. Over the past 24 hours she has developed progressive nausea and infrequent vomiting. Also abdominal distention. Her pain is about the same as it was several days ago. No urinary symptoms. No fever or chills She was admitted by Dr. Excell Seltzer and NG placement was finally accomplished on 02/12/16.  Her SBO slowly improved with decompression and NPO.  Her abdominal pain which was all over her abdomen on admit improved and localized down to her LLQ.  CT scan on 01/15/16 showed ongoing acute diverticulitis  with multiple fluid collection, the largest in the rectovaginal recess measuring 6.8 cm.  IR placed a transgluteal drain on 01/16/16 and got 20 ml of purulent aspirate.  She was maintained on IV Zosyn from 01/09/16 thru 01/20/16.  Culture at that point grew out [place].  She was switched to Flagyl and a Cephalosporin at that point for better coverage.  If she continues to do well we plan discharge on oral antibiotics.  Conversion to oral antibiotics started on 01/21/16.  She was discharged the following day with follow up as noted below.    Condition on d/c:  Improved  CBC Latest Ref Rng 01/20/2016 01/18/2016 01/17/2016  WBC 4.0 - 10.5 K/uL 6.4 10.0 12.7(H)  Hemoglobin 12.0 - 15.0 g/dL 11.3(L) 11.8(L) 12.1  Hematocrit 36.0 - 46.0 % 32.9(L) 34.8(L) 36.0  Platelets 150 - 400 K/uL 263 273 340   CMP Latest Ref Rng 01/20/2016 01/18/2016 01/17/2016  Glucose 65 - 99 mg/dL 98 111(H) 81  BUN 6 - 20 mg/dL <5(L) <5(L) <5(L)  Creatinine 0.44 - 1.00 mg/dL 0.62 0.58 0.69  Sodium 135 - 145 mmol/L 138 137 135  Potassium 3.5 - 5.1 mmol/L 3.9 3.9 4.3  Chloride 101 - 111 mmol/L 107 108 105  CO2 22 - 32 mmol/L 27 22 18(L)  Calcium 8.9 - 10.3 mg/dL 8.3(L) 8.4(L) 8.4(L)    CT SCAN 01/15/16:  Multiple fluid collections are identified within the pelvis. The largest is within the rectovaginal recess measuring 6 cm. Previously this measured 6.8 cm. Left lower quadrant small bowel interloop fluid collection measures 4.6 cm, image 65 of series 2. Previously 5.2 cm. Again identified is a cluster of small abscess ease adjacent to the sigmoid colon measuring 7.2 x 3.5 cm. Previously  6.8 x 3.2 cm. No significant pneumoperitoneum identified.  Disposition: 06-Home-Health Care Svc     Medication List    STOP taking these medications        ciprofloxacin 500 MG tablet  Commonly known as:  CIPRO      TAKE these medications        ARNUITY ELLIPTA 200 MCG/ACT Aepb  Generic drug:  Fluticasone Furoate  Inhale 1 puff  into the lungs daily.     cefUROXime 500 MG tablet  Commonly known as:  CEFTIN  Take 1 tablet (500 mg total) by mouth 2 (two) times daily with a meal.     fexofenadine 180 MG tablet  Commonly known as:  ALLEGRA  Take 180 mg by mouth daily.     fluticasone 50 MCG/ACT nasal spray  Commonly known as:  FLONASE  Place 1 spray into both nostrils daily.     metroNIDAZOLE 500 MG tablet  Commonly known as:  FLAGYL  Take 1 tablet (500 mg total) by mouth 3 (three) times daily.     multivitamin with minerals Tabs tablet  You can restart this next week after your sure your tolerating new antibiotics well.     naproxen sodium 220 MG tablet  Commonly known as:  ANAPROX  Take 220 mg by mouth 2 (two) times daily as needed (pain).     omeprazole 20 MG capsule  Commonly known as:  PRILOSEC  Take 20 mg by mouth daily as needed (acid relux).     ondansetron 4 MG disintegrating tablet  Commonly known as:  ZOFRAN ODT  Take 1 tablet (4 mg total) by mouth every 8 (eight) hours as needed for nausea or vomiting.     PROAIR HFA 108 (90 Base) MCG/ACT inhaler  Generic drug:  albuterol  Inhale 2 puffs into the lungs every 4 (four) hours as needed for wheezing or shortness of breath.     saccharomyces boulardii 250 MG capsule  Commonly known as:  FLORASTOR  You can buy this at any drug store over the counter.  I would use it for at least 2 weeks past the last antibiotic.     sertraline 100 MG tablet  Commonly known as:  ZOLOFT  Take 50 mg by mouth daily.     traMADol 50 MG tablet  Commonly known as:  ULTRAM  Take 2 tablets (100 mg total) by mouth every 6 (six) hours as needed for moderate pain or severe pain.     valACYclovir 1000 MG tablet  Commonly known as:  VALTREX  Take 1 g by mouth every 12 (twelve) hours as needed (outbreaks).     zolpidem 5 MG tablet  Commonly known as:  AMBIEN  Take 2.5 mg by mouth at bedtime as needed for sleep.       Follow-up Information    Follow up with  Marjorie Smolder, MD.   Specialty:  Family Medicine   Why:  Call and follow up with her for other medical issues.  Let her review your medicines also.     Contact information:   [address] [place] [address] [phone]       Follow up with Shann Medal, MD On 02/04/2016.   Specialty:  General Surgery   Why:  Your appointment is at 8:30 AM, be at the office 30 minutes early for check in.   Contact information:   [address] [place] [address] [phone]  Follow up with [facility].   Why:  home health nurse for drain mgmt   Contact information:   [address] [place] [address] [phone]       Follow up with CT scan.   Why:  Radiology should call you with follow up times for your next CT scan.  If you don't hear from them by Monday PM call our office and let them check on it for you.        SignedEarnstine Regal 02/01/2016, 11:17 AM  Agree with above. Somewhat delayed.  I just saw her in the office.  Alphonsa Overall, MD, [facility] Surgery Pager: [phone] Office phone:  [phone]

## 2016-01-22 MED ORDER — ADULT MULTIVITAMIN W/MINERALS CH: ORAL_TABLET | ORAL | Status: DC

## 2016-01-22 MED ORDER — METRONIDAZOLE 500 MG PO TABS: 500.0000 mg | ORAL_TABLET | Freq: Three times a day (TID) | ORAL | Status: DC

## 2016-01-22 MED ORDER — CEFUROXIME AXETIL 500 MG PO TABS: 500.0000 mg | ORAL_TABLET | Freq: Two times a day (BID) | ORAL | Status: DC

## 2016-01-22 MED ORDER — ONDANSETRON 4 MG PO TBDP: 4.0000 mg | ORAL_TABLET | Freq: Three times a day (TID) | ORAL | Status: DC | PRN

## 2016-01-22 MED ORDER — SACCHAROMYCES BOULARDII 250 MG PO CAPS: ORAL_CAPSULE | ORAL | Status: DC

## 2016-01-22 MED ORDER — TRAMADOL HCL 50 MG PO TABS: 100.0000 mg | ORAL_TABLET | Freq: Four times a day (QID) | ORAL | Status: DC | PRN

## 2016-01-22 NOTE — Progress Notes (Signed)
Patient verbalized understanding of discharge instructions. Patient is stable at discharge. 

## 2016-01-22 NOTE — Progress Notes (Signed)
  Subjective: Vomited after lunch yesterday, and is very nervous about eating and going home.  Exam this AM, shows no pain issues and only the one episode of nausea and vomiting.    Objective: Vital signs in last 24 hours: Temp:  [98.1 F (36.7 C)-98.6 F (37 C)] 98.3 F (36.8 C) (06/16 0546) Pulse Rate:  [63-74] 63 (06/16 0546) Resp:  [16-18] 18 (06/16 0546) BP: (123-130)/(78-85) 129/78 mmHg (06/16 0546) SpO2:  [95 %-96 %] 95 % (06/16 0546) Last BM Date: 01/20/16 420 PO Urine 1325 Drain 86ml Stool none Afebrile, VSS No labs Intake/Output from previous day: 06/15 0701 - 06/16 0700 In: 425 [P.O.:420] Out: 1346 [Urine:1325; Emesis/NG output:1; Drains:20] Intake/Output this shift:    General appearance: alert, cooperative and no distress Resp: clear to auscultation bilaterally GI: soft, non-tender; bowel sounds normal; no masses,  no organomegaly and drain bag has been flushed and drainage in the tubing is clear serous fluid.  Lab Results:   Recent Labs  01/20/16 0403  WBC 6.4  HGB 11.3*  HCT 32.9*  PLT 263    BMET  Recent Labs  01/20/16 0403  NA 138  K 3.9  CL 107  CO2 27  GLUCOSE 98  BUN <5*  CREATININE 0.62  CALCIUM 8.3*   PT/INR No results for input(s): LABPROT, INR in the last 72 hours.  No results for input(s): AST, ALT, ALKPHOS, BILITOT, PROT, ALBUMIN in the last 168 hours.   Lipase  No results found for: LIPASE   Studies/Results: No results found.  Medications: . budesonide (PULMICORT) nebulizer solution  0.5 mg Nebulization BID  . cefUROXime  500 mg Oral BID WC  . enoxaparin (LOVENOX) injection  40 mg Subcutaneous Q24H  . famotidine  20 mg Oral BID  . metroNIDAZOLE  500 mg Oral Q8H  . saccharomyces boulardii  250 mg Oral BID  . sertraline  50 mg Oral Daily   AODM Hypertension Insomnia Hypercholesteremia Asthma  Assessment/Plan Diverticulitis with abscess Trans gluteal drain 01/16/16 IR SBO secondary to diverticular  disease - better Prior hx of vesicovaginal fistula with hysterectomy/appendectomy FEN: IV fluids/full liquids ID: Day 9 Zosyn (started 01/12/2016), Pharmacy has made recommendations to change antibiotic, based on cultures that are coming out now. Ceftin/Flagyl started 01/21/15.  VIRIDANS STREPTOCOCCUS from culture. DVT: Lovenox/SCD     Plan:  I told her to go ahead and eat something and we will see how she does.  We are planning to get CT scan next week and follow up with Dr. Lucia Gaskins in 2 weeks, 02/04/16 @0830 .  I will check on her later this AM and see how she is doing.      LOS: 9 days    JENNINGS,WILLARD 01/22/2016 [phone]  Agree with above Nauseated yesterday.  Better today.  Mack Hook, in the room with the patient.  I went over future plans and options.  Will plan CT scan of abdomen/drain and see me back in the office.  Alphonsa Overall, MD, [facility] Surgery Pager: [phone] Office phone:  [phone]

## 2016-01-22 NOTE — Progress Notes (Signed)
Patient ID: Traci Hudson, female   DOB: 1955/09/06, 60 y.o.   MRN: [id number]    Referring Physician(s): Dr. Alphonsa Overall  Supervising Physician: Marybelle Killings  Patient Status: inpt  Chief Complaint: Diverticulitis with abscess  Subjective: Patient is feeling well today.  Minimal drainage output.  Allergies: Codeine; Sulfa antibiotics; and Tape  Medications: Prior to Admission medications   Medication Sig Start Date End Date Taking? Authorizing Provider  ARNUITY ELLIPTA 200 MCG/ACT AEPB Inhale 1 puff into the lungs daily. 12/26/15  Yes Historical Provider, MD  fexofenadine (ALLEGRA) 180 MG tablet Take 180 mg by mouth daily.  11/17/15  Yes Historical Provider, MD  fluticasone (FLONASE) 50 MCG/ACT nasal spray Place 1 spray into both nostrils daily.  10/22/15  Yes Historical Provider, MD  omeprazole (PRILOSEC) 20 MG capsule Take 20 mg by mouth daily as needed (acid relux).    Yes Historical Provider, MD  sertraline (ZOLOFT) 100 MG tablet Take 50 mg by mouth daily. 11/09/15  Yes Historical Provider, MD  zolpidem (AMBIEN) 5 MG tablet Take 2.5 mg by mouth at bedtime as needed for sleep.    Yes Historical Provider, MD  cefUROXime (CEFTIN) 500 MG tablet Take 1 tablet (500 mg total) by mouth 2 (two) times daily with a meal. 01/22/16   Earnstine Regal, PA-C  metroNIDAZOLE (FLAGYL) 500 MG tablet Take 1 tablet (500 mg total) by mouth 3 (three) times daily. 01/22/16   Earnstine Regal, PA-C  Multiple Vitamin (MULTIVITAMIN WITH MINERALS) TABS tablet You can restart this next week after your sure your tolerating new antibiotics well. 01/22/16   Earnstine Regal, PA-C  naproxen sodium (ANAPROX) 220 MG tablet Take 220 mg by mouth 2 (two) times daily as needed (pain).    Historical Provider, MD  ondansetron (ZOFRAN ODT) 4 MG disintegrating tablet Take 1 tablet (4 mg total) by mouth every 8 (eight) hours as needed for nausea or vomiting. 01/22/16   Earnstine Regal, PA-C  PROAIR HFA 108 (90 BASE) MCG/ACT  inhaler Inhale 2 puffs into the lungs every 4 (four) hours as needed for wheezing or shortness of breath.  12/21/14   Historical Provider, MD  saccharomyces boulardii (FLORASTOR) 250 MG capsule You can buy this at any drug store over the counter.  I would use it for at least 2 weeks past the last antibiotic. 01/22/16   Earnstine Regal, PA-C  traMADol (ULTRAM) 50 MG tablet Take 2 tablets (100 mg total) by mouth every 6 (six) hours as needed for moderate pain or severe pain. 01/22/16   Earnstine Regal, PA-C  valACYclovir (VALTREX) 1000 MG tablet Take 1 g by mouth every 12 (twelve) hours as needed (outbreaks).  11/09/15   Historical Provider, MD    Vital Signs: BP 129/78 mmHg  Pulse 63  Temp(Src) 98.3 F (36.8 C) (Oral)  Resp 18  Ht 5\' 5"  (1.651 m)  Wt 155 lb (70.308 kg)  BMI 25.79 kg/m2  SpO2 95%  Physical Exam: Abd: soft, NT, ND, drain with no output currently, drain site is c/d/i. 20cc documented in last 24 hrs  Imaging: No results found.  Labs:  CBC:  Recent Labs  01/14/16 0408 01/17/16 0443 01/18/16 0352 01/20/16 0403  WBC 10.5 12.7* 10.0 6.4  HGB 11.7* 12.1 11.8* 11.3*  HCT 35.3* 36.0 34.8* 32.9*  PLT 288 340 273 263    COAGS: No results for input(s): INR, APTT in the last 8760 hours.  BMP:  Recent Labs  01/14/16 0408 01/17/16 0443 01/18/16 0352 01/20/16 0403  NA 133* 135 137 138  K 3.8 4.3 3.9 3.9  CL 100* 105 108 107  CO2 24 18* 22 27  GLUCOSE 98 81 111* 98  BUN 6 <5* <5* <5*  CALCIUM 8.0* 8.4* 8.4* 8.3*  CREATININE 0.60 0.69 0.58 0.62  GFRNONAA >60 >60 >60 >60  GFRAA >60 >60 >60 >60    LIVER FUNCTION TESTS:  Recent Labs  01/07/16 1124  BILITOT 0.3  AST 25  ALT 20  ALKPHOS 67  PROT 7.5  ALBUMIN 4.5    Assessment and Plan: 1. Diverticulitis with abscess, s/p perc drain - [place] 01-16-16 -minimal drainage output -will plan for follow up in drain clinic in about 1 week for repeat CT scan and possible drain injection -she should flush her  drain once a day.  Electronically Signed: Henreitta Cea 01/22/2016, 1:40 PM   I spent a total of 15 Minutes at the the patient's bedside AND on the patient's hospital floor or unit, greater than 50% of which was counseling/coordinating care for diverticulitis with abscess

## 2016-01-25 ENCOUNTER — Other Ambulatory Visit: Payer: Self-pay | Admitting: Radiology

## 2016-01-25 ENCOUNTER — Other Ambulatory Visit: Payer: Self-pay | Admitting: Surgery

## 2016-01-25 DIAGNOSIS — K56609 Unspecified intestinal obstruction, unspecified as to partial versus complete obstruction: Secondary | ICD-10-CM

## 2016-01-25 DIAGNOSIS — IMO0002 Reserved for concepts with insufficient information to code with codable children: Secondary | ICD-10-CM

## 2016-01-26 ENCOUNTER — Ambulatory Visit: Payer: BLUE CROSS/BLUE SHIELD | Admitting: Internal Medicine

## 2016-01-27 ENCOUNTER — Other Ambulatory Visit: Payer: Self-pay | Admitting: Surgery

## 2016-01-27 ENCOUNTER — Ambulatory Visit
Admission: RE | Admit: 2016-01-27 | Discharge: 2016-01-27 | Disposition: A | Discharge status: Home / Self Care | Payer: BLUE CROSS/BLUE SHIELD | Source: Ambulatory Visit | Attending: Surgery | Admitting: Surgery

## 2016-01-27 ENCOUNTER — Ambulatory Visit
Admission: RE | Admit: 2016-01-27 | Discharge: 2016-01-27 | Disposition: A | Discharge status: Home / Self Care | Payer: BLUE CROSS/BLUE SHIELD | Source: Ambulatory Visit | Attending: Radiology | Admitting: Radiology

## 2016-01-27 DIAGNOSIS — K56609 Unspecified intestinal obstruction, unspecified as to partial versus complete obstruction: Secondary | ICD-10-CM

## 2016-01-27 DIAGNOSIS — IMO0002 Reserved for concepts with insufficient information to code with codable children: Secondary | ICD-10-CM

## 2016-01-27 MED ORDER — IOPAMIDOL (ISOVUE-300) INJECTION 61%
97.0000 mL | Freq: Once | INTRAVENOUS | Status: AC | PRN
  Administered 2016-01-27: 97 mL via INTRAVENOUS

## 2016-01-27 NOTE — Progress Notes (Signed)
Referring Physician(s): Dr Alphonsa Overall  Chief Complaint: The patient is seen in follow up today s/p Technically successful CT-guided pelvic abscess drain catheter placement 01/16/2016  History of present illness:  Diverticulitis with dominant cul-de-sac abscess. Right transgluteal drain placed 01/16/16 Pt has done well. Flushing daily with 5-10 cc saline. Does feel pressure with flush- no pain Output minimal for few days (less than 10 cc/24 hrs). Serous color Afeb Taking Ceftin and Metronidazole still daily Denies pain; N/V Here today for CT scan and drain injection with possible removal   Past Medical History  Diagnosis Date  . Hypercholesteremia   . Skin cancer   . Insomnia   . Hypertension   . Diverticulitis   . Asthma   . DM (diabetes mellitus) ([place])     Past Surgical History  Procedure Laterality Date  . Brain surgery  1969  . Cholecystectomy    . Breast reduction surgery  2000  . Appendectomy  1998  . Vesicovaginal fistula closure w/ tah    . Abdominal hysterectomy      Allergies: Codeine; Sulfa antibiotics; and Tape  Medications: Prior to Admission medications   Medication Sig Start Date End Date Taking? Authorizing Provider  ARNUITY ELLIPTA 200 MCG/ACT AEPB Inhale 1 puff into the lungs daily. 12/26/15   Historical Provider, MD  cefUROXime (CEFTIN) 500 MG tablet Take 1 tablet (500 mg total) by mouth 2 (two) times daily with a meal. 01/22/16   Earnstine Regal, PA-C  fexofenadine (ALLEGRA) 180 MG tablet Take 180 mg by mouth daily.  11/17/15   Historical Provider, MD  fluticasone (FLONASE) 50 MCG/ACT nasal spray Place 1 spray into both nostrils daily.  10/22/15   Historical Provider, MD  metroNIDAZOLE (FLAGYL) 500 MG tablet Take 1 tablet (500 mg total) by mouth 3 (three) times daily. 01/22/16   Earnstine Regal, PA-C  Multiple Vitamin (MULTIVITAMIN WITH MINERALS) TABS tablet You can restart this next week after your sure your tolerating new antibiotics well.  01/22/16   Earnstine Regal, PA-C  naproxen sodium (ANAPROX) 220 MG tablet Take 220 mg by mouth 2 (two) times daily as needed (pain).    Historical Provider, MD  omeprazole (PRILOSEC) 20 MG capsule Take 20 mg by mouth daily as needed (acid relux).     Historical Provider, MD  ondansetron (ZOFRAN ODT) 4 MG disintegrating tablet Take 1 tablet (4 mg total) by mouth every 8 (eight) hours as needed for nausea or vomiting. 01/22/16   Earnstine Regal, PA-C  PROAIR HFA 108 (90 BASE) MCG/ACT inhaler Inhale 2 puffs into the lungs every 4 (four) hours as needed for wheezing or shortness of breath.  12/21/14   Historical Provider, MD  saccharomyces boulardii (FLORASTOR) 250 MG capsule You can buy this at any drug store over the counter.  I would use it for at least 2 weeks past the last antibiotic. 01/22/16   Earnstine Regal, PA-C  sertraline (ZOLOFT) 100 MG tablet Take 50 mg by mouth daily. 11/09/15   Historical Provider, MD  traMADol (ULTRAM) 50 MG tablet Take 2 tablets (100 mg total) by mouth every 6 (six) hours as needed for moderate pain or severe pain. 01/22/16   Earnstine Regal, PA-C  valACYclovir (VALTREX) 1000 MG tablet Take 1 g by mouth every 12 (twelve) hours as needed (outbreaks).  11/09/15   Historical Provider, MD  zolpidem (AMBIEN) 5 MG tablet Take 2.5 mg by mouth at bedtime as needed for sleep.     Historical Provider, MD  Family History  Problem Relation Age of Onset  . Emphysema Mother     smoked  . Lung cancer Mother     smoked    Social History   Social History  . Marital Status: Single    Spouse Name: N/A  . Number of Children: N/A  . Years of Education: N/A   Occupational History  . sales    Social History Main Topics  . Smoking status: Never Smoker   . Smokeless tobacco: Never Used  . Alcohol Use: 1.2 oz/week    2 Glasses of wine per week     Comment: occasionally  . Drug Use: No  . Sexual Activity: Not on file   Other Topics Concern  . Not on file   Social History  Narrative     Vital Signs: BP 136/83 mmHg  Pulse 69  SpO2 98%  Reports No fevers  Physical Exam  Constitutional: She is oriented to person, place, and time. She appears well-nourished.  Abdominal: Soft. Bowel sounds are normal.  Musculoskeletal: Normal range of motion.  Neurological: She is alert and oriented to person, place, and time.  Skin: Skin is warm and dry.  Site of drain is NT; no bleeding No sign of infection  Drain intact Output serous color Less than 10 cc in bag    Psychiatric: She has a normal mood and affect. Her behavior is normal.  Vitals reviewed.  Rt TG diverticular abscess drain injected under fluoroscopy No sign of fistula per Dr Barbie Banner Does show residual potential space   Imaging: Ct Abdomen Pelvis W Contrast  01/27/2016  CLINICAL DATA:  Follow-up drain EXAM: CT ABDOMEN AND PELVIS WITH CONTRAST TECHNIQUE: Multidetector CT imaging of the abdomen and pelvis was performed using the standard protocol following bolus administration of intravenous contrast. CONTRAST:  51mL ISOVUE-300 IOPAMIDOL (ISOVUE-300) INJECTION 61% COMPARISON:  01/15/2016 FINDINGS: A right trans gluteal pelvic abscess has been drained. This previously visualized abscess has resolved. There is a second abscess within the mid lower abdomen measuring 1.2 x 5.4 cm containing fluid and gas which has also improved. It is surrounded by bowel loops and bony structures. Inflammatory changes of the sigmoid colon have markedly improved. Diverticulosis persists. No new abscess. Postcholecystectomy Liver, spleen, pancreas, adrenal glands, and kidneys are within normal limits Post appendectomy staples. Bladder is decompressed.  Uterus absent. Stable bony framework. IMPRESSION: The deep pelvic abscess has been drained and is resolved by CT imaging. Fluoroscopic injection is to follow to evaluate for potential space and fistula. The second lower abdominal abscess has improved. A residual cavity persists. This  abscess is deep and surrounded by bony structures and bowel loops. The patient is still on antibiotics. No new abscess. Changes related to inflammation of the sigmoid colon have improved. Electronically Signed   By: Marybelle Killings M.D.   On: 01/27/2016 10:49    Labs:  CBC:  Recent Labs  01/14/16 0408 01/17/16 0443 01/18/16 0352 01/20/16 0403  WBC 10.5 12.7* 10.0 6.4  HGB 11.7* 12.1 11.8* 11.3*  HCT 35.3* 36.0 34.8* 32.9*  PLT 288 340 273 263    COAGS: No results for input(s): INR, APTT in the last 8760 hours.  BMP:  Recent Labs  01/14/16 0408 01/17/16 0443 01/18/16 0352 01/20/16 0403  NA 133* 135 137 138  K 3.8 4.3 3.9 3.9  CL 100* 105 108 107  CO2 24 18* 22 27  GLUCOSE 98 81 111* 98  BUN 6 <5* <5* <5*  CALCIUM 8.0* 8.4*  8.4* 8.3*  CREATININE 0.60 0.69 0.58 0.62  GFRNONAA >60 >60 >60 >60  GFRAA >60 >60 >60 >60    LIVER FUNCTION TESTS:  Recent Labs  01/07/16 1124  BILITOT 0.3  AST 25  ALT 20  ALKPHOS 67  PROT 7.5  ALBUMIN 4.5    Assessment:  Diverticular abscess Rt TG drain intact To remain x 1 more week secondary result of injection. Pt aware scheduled for return in 1 week for injecion only---no need for CT per Dr Barbie Banner She is agreeable   Signed: Skyler Carel A 01/27/2016, 11:30 AM   Please refer to Dr. Barbie Banner attestation of this note for management and plan.

## 2016-02-02 ENCOUNTER — Telehealth: Payer: Self-pay | Admitting: *Deleted

## 2016-02-02 ENCOUNTER — Other Ambulatory Visit: Payer: BLUE CROSS/BLUE SHIELD

## 2016-02-02 NOTE — Telephone Encounter (Signed)
Copy of this encounter faxed to Dr. Darcus Austin (PCP)

## 2016-02-02 NOTE — Telephone Encounter (Signed)
-----   Message from Tanda Rockers, MD sent at 02/01/2016  5:12 PM EDT ----- Let primary know - she is very low risk as never smoked so no compelling reason to pursue f/u if pt reluctant.  ----- Message -----    From: Rosana Berger, CMA    Sent: 02/01/2016   3:23 PM      To: Tanda Rockers, MD  Pt cancelled ct chest ----- Message -----    From: Tanda Rockers, MD    Sent: 01/30/2015   3:15 PM      To: Rosana Berger, CMA  Be sure had ct no contrast by now f/u spn

## 2016-02-03 ENCOUNTER — Ambulatory Visit
Admission: RE | Admit: 2016-02-03 | Discharge: 2016-02-03 | Disposition: A | Discharge status: Home / Self Care | Payer: BLUE CROSS/BLUE SHIELD | Source: Ambulatory Visit | Attending: Surgery | Admitting: Surgery

## 2016-02-03 DIAGNOSIS — IMO0002 Reserved for concepts with insufficient information to code with codable children: Secondary | ICD-10-CM

## 2016-02-03 DIAGNOSIS — K56609 Unspecified intestinal obstruction, unspecified as to partial versus complete obstruction: Secondary | ICD-10-CM

## 2016-02-03 NOTE — Progress Notes (Signed)
Chief Complaint: Status post percutaneous drainage of diverticular abscess on 01/16/2016.  History of Present Illness: Traci Hudson is a 60 y.o. female status post percutaneous drainage of sigmoid diverticular abscess on 01/16/2016. CT on 01/27/2016 demonstrated resolution of the diverticular abscess and improvement in a second abscess. Contrast injection demonstrated no evidence of fistula to bowel. Due to some potential extension of the abscess cavity superiorly, the drain was left in place for 1 week. Since that time, there has been no significant output from the drain. Traci Hudson is still on antibiotics. She is scheduled to see Dr. Lucia Gaskins tomorrow morning.  Past Medical History  Diagnosis Date  . Hypercholesteremia   . Skin cancer   . Insomnia   . Hypertension   . Diverticulitis   . Asthma   . DM (diabetes mellitus) ([place])     Past Surgical History  Procedure Laterality Date  . Brain surgery  1969  . Cholecystectomy    . Breast reduction surgery  2000  . Appendectomy  1998  . Vesicovaginal fistula closure w/ tah    . Abdominal hysterectomy      Allergies: Codeine; Sulfa antibiotics; and Tape  Medications: Prior to Admission medications   Medication Sig Start Date End Date Taking? Authorizing Provider  ARNUITY ELLIPTA 200 MCG/ACT AEPB Inhale 1 puff into the lungs daily. 12/26/15   Historical Provider, MD  cefUROXime (CEFTIN) 500 MG tablet Take 1 tablet (500 mg total) by mouth 2 (two) times daily with a meal. 01/22/16   Earnstine Regal, PA-C  fexofenadine (ALLEGRA) 180 MG tablet Take 180 mg by mouth daily.  11/17/15   Historical Provider, MD  fluticasone (FLONASE) 50 MCG/ACT nasal spray Place 1 spray into both nostrils daily.  10/22/15   Historical Provider, MD  metroNIDAZOLE (FLAGYL) 500 MG tablet Take 1 tablet (500 mg total) by mouth 3 (three) times daily. 01/22/16   Earnstine Regal, PA-C  Multiple Vitamin (MULTIVITAMIN WITH MINERALS) TABS tablet You can restart this  next week after your sure your tolerating new antibiotics well. 01/22/16   Earnstine Regal, PA-C  naproxen sodium (ANAPROX) 220 MG tablet Take 220 mg by mouth 2 (two) times daily as needed (pain).    Historical Provider, MD  omeprazole (PRILOSEC) 20 MG capsule Take 20 mg by mouth daily as needed (acid relux).     Historical Provider, MD  ondansetron (ZOFRAN ODT) 4 MG disintegrating tablet Take 1 tablet (4 mg total) by mouth every 8 (eight) hours as needed for nausea or vomiting. 01/22/16   Earnstine Regal, PA-C  PROAIR HFA 108 (90 BASE) MCG/ACT inhaler Inhale 2 puffs into the lungs every 4 (four) hours as needed for wheezing or shortness of breath.  12/21/14   Historical Provider, MD  saccharomyces boulardii (FLORASTOR) 250 MG capsule You can buy this at any drug store over the counter.  I would use it for at least 2 weeks past the last antibiotic. 01/22/16   Earnstine Regal, PA-C  sertraline (ZOLOFT) 100 MG tablet Take 50 mg by mouth daily. 11/09/15   Historical Provider, MD  traMADol (ULTRAM) 50 MG tablet Take 2 tablets (100 mg total) by mouth every 6 (six) hours as needed for moderate pain or severe pain. 01/22/16   Earnstine Regal, PA-C  valACYclovir (VALTREX) 1000 MG tablet Take 1 g by mouth every 12 (twelve) hours as needed (outbreaks).  11/09/15   Historical Provider, MD  zolpidem (AMBIEN) 5 MG tablet Take 2.5 mg by mouth at bedtime as needed for  sleep.     Historical Provider, MD     Family History  Problem Relation Age of Onset  . Emphysema Mother     smoked  . Lung cancer Mother     smoked    Social History   Social History  . Marital Status: Single    Spouse Name: N/A  . Number of Children: N/A  . Years of Education: N/A   Occupational History  . sales    Social History Main Topics  . Smoking status: Never Smoker   . Smokeless tobacco: Never Used  . Alcohol Use: 1.2 oz/week    2 Glasses of wine per week     Comment: occasionally  . Drug Use: No  . Sexual Activity: Not on  file   Other Topics Concern  . Not on file   Social History Narrative    Review of Systems: A 12 point ROS discussed and pertinent positives are indicated in the HPI above.  All other systems are negative.  Review of Systems  Respiratory: Negative.   Cardiovascular: Negative.   Gastrointestinal: Negative.        Mild soreness at drain exit site.  Genitourinary: Negative.   Musculoskeletal: Negative.     Vital Signs: BP 153/83 mmHg  Pulse 78  Temp(Src) 98.1 F (36.7 C)  SpO2 98%  Physical Exam  Abdominal: Soft. She exhibits no distension. There is no tenderness. There is no rebound and no guarding.  Nursing note and vitals reviewed.   Mallampati Score:     Imaging: Ct Abdomen Pelvis W Contrast  01/27/2016  CLINICAL DATA:  Follow-up drain EXAM: CT ABDOMEN AND PELVIS WITH CONTRAST TECHNIQUE: Multidetector CT imaging of the abdomen and pelvis was performed using the standard protocol following bolus administration of intravenous contrast. CONTRAST:  82mL ISOVUE-300 IOPAMIDOL (ISOVUE-300) INJECTION 61% COMPARISON:  01/15/2016 FINDINGS: A right trans gluteal pelvic abscess has been drained. This previously visualized abscess has resolved. There is a second abscess within the mid lower abdomen measuring 1.2 x 5.4 cm containing fluid and gas which has also improved. It is surrounded by bowel loops and bony structures. Inflammatory changes of the sigmoid colon have markedly improved. Diverticulosis persists. No new abscess. Postcholecystectomy Liver, spleen, pancreas, adrenal glands, and kidneys are within normal limits Post appendectomy staples. Bladder is decompressed.  Uterus absent. Stable bony framework. IMPRESSION: The deep pelvic abscess has been drained and is resolved by CT imaging. Fluoroscopic injection is to follow to evaluate for potential space and fistula. The second lower abdominal abscess has improved. A residual cavity persists. This abscess is deep and surrounded by  bony structures and bowel loops. The patient is still on antibiotics. No new abscess. Changes related to inflammation of the sigmoid colon have improved. Electronically Signed   By: Marybelle Killings M.D.   On: 01/27/2016 10:49   Ct Abdomen Pelvis W Contrast  01/15/2016  CLINICAL DATA:  Left lower quadrant pain.  Evaluate abscess EXAM: CT ABDOMEN AND PELVIS WITH CONTRAST TECHNIQUE: Multidetector CT imaging of the abdomen and pelvis was performed using the standard protocol following bolus administration of intravenous contrast. CONTRAST:  156mL ISOVUE-300 IOPAMIDOL (ISOVUE-300) INJECTION 61% COMPARISON:  01/13/2016 FINDINGS: Lower chest: There is a small right pleural effusion identified. Atelectasis is identified within both lung bases right greater than left. No airspace consolidation. Hepatobiliary: No focal liver abnormality. Previous coli cystectomy. Increase caliber of the CBD measures 1 cm. No obstructing stone or mass. Pancreas: No mass, inflammatory changes, or other significant abnormality.  Spleen: Negative Adrenals/Urinary Tract: The adrenal glands are both normal. Normal appearance of both kidneys. Urinary bladder is unremarkable. Stomach/Bowel: NG tube is within the stomach. Increase caliber of the small bowel loops, some of which have air-fluid levels. These measure up to 3.2 cm. Numerous colonic diverticula identified. Wall thickening and inflammation involving the sigmoid colon is again identified. Findings compatible with acute diverticulitis. Vascular/Lymphatic: No pathologically enlarged lymph nodes. No evidence of abdominal aortic aneurysm. Reproductive: Previous hysterectomy.  No adnexal mass. Other: Multiple fluid collections are identified within the pelvis. The largest is within the rectovaginal recess measuring 6 cm. Previously this measured 6.8 cm. Left lower quadrant small bowel interloop fluid collection measures 4.6 cm, image 65 of series 2. Previously 5.2 cm. Again identified is a cluster  of small abscess ease adjacent to the sigmoid colon measuring 7.2 x 3.5 cm. Previously 6.8 x 3.2 cm. No significant pneumoperitoneum identified. 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 No aggressive lytic or sclerotic bone lesions noted. IMPRESSION: 1. Again noted are changes of acute sigmoid diverticulitis involving the sigmoid colon. 2. Multiple fluid collections are identified within the pelvis. These are not significantly improved when compared with exam from 01/13/2016. The largest is in the rectovaginal recess measuring 6.8 cm. 3. Small bowel obstruction secondary to #1. Electronically Signed   By: Kerby Moors M.D.   On: 01/15/2016 15:32   Ct Abdomen Pelvis W Contrast  01/13/2016  CLINICAL DATA:  Left lower quadrant pain. History of diverticulitis. EXAM: CT ABDOMEN AND PELVIS WITH CONTRAST TECHNIQUE: Multidetector CT imaging of the abdomen and pelvis was performed using the standard protocol following bolus administration of intravenous contrast. CONTRAST:  145mL ISOVUE-300 IOPAMIDOL (ISOVUE-300) INJECTION 61% COMPARISON:  01/07/2016 FINDINGS: Lower chest and abdominal wall: Trace pleural effusions, new. Bibasilar atelectasis. Hepatobiliary: No focal liver abnormality.Cholecystectomy with stable mild prominence of the biliary tree. Biliary labs were normal 01/07/2016. Pancreas: Unremarkable. Spleen: Unremarkable. Adrenals/Urinary Tract: Negative adrenals. No hydronephrosis or stone. Collapsed urinary bladder. Reproductive:Hysterectomy. Stomach/Bowel: Recent diverticulitis with multiple rim enhancing collections in the pelvis. The largest is in the rectovaginal recess and measures up to 7 cm. A cluster of small abscesses present ventral and inferior to the sigmoid colon, in total measuring 7 x 3 cm in axial span. Discrete tubular collection in the deep interloop spaces measuring 5 cm in length by 2 cm in thickness. These 3 measured areas likely do not communicate. Diverticular  inflammation with colonic wall thickening is improved. There is new small bowel dilatation and fluid filling proximal to collapsed and avidly enhancing segments in the pelvis. No free pneumoperitoneum. Appendectomy. Vascular/Lymphatic: No acute vascular abnormality. No mass or adenopathy. Musculoskeletal: No acute abnormalities. IMPRESSION: 1. Recent diverticulitis with multiple pockets of abscess in the pelvis. The largest is in the rectovaginal recess and measures 7 cm. 2. Small bowel obstruction secondary to #1. 3. Bibasilar atelectasis with trace right effusion. Electronically Signed   By: Monte Fantasia M.D.   On: 01/13/2016 04:32   Ct Abdomen Pelvis W Contrast  01/07/2016  CLINICAL DATA:  Abdominal pain and nausea beginning this morning. No known injury. Initial encounter. EXAM: CT ABDOMEN AND PELVIS WITH CONTRAST TECHNIQUE: Multidetector CT imaging of the abdomen and pelvis was performed using the standard protocol following bolus administration of intravenous contrast. CONTRAST:  100 ml ISOVUE-300 IOPAMIDOL (ISOVUE-300) INJECTION 61% COMPARISON:  CT abdomen and pelvis 08/20/2009. FINDINGS: There is some dependent atelectasis in the lung bases. No pleural or pericardial effusion. Small amount of contrast is  seen in the distal esophagus compatible with either poor esophageal motility and/or reflux. Very small hiatal hernia is identified. The patient is status post cholecystectomy. The liver, spleen, pancreas, kidneys and adrenal glands appear normal. Extensive diverticular disease is identified. There is marked stranding and wall thickening in the mid sigmoid colon consistent with superimposed acute diverticulitis. Associated small volume of free pelvic fluid and fluid in the right pericolic gutter is noted. No abscess or perforation is identified. The stomach and small bowel appear normal. The patient is status post hysterectomy. No lymphadenopathy is identified. No worrisome bony lesion is seen. Bilateral  hip osteoarthritis is noted. IMPRESSION: Findings consistent with sigmoid diverticulitis without abscess or perforation. Associated small volume of free fluid in the pelvis and right pericolic gutter is noted. Small volume of contrast in the distal esophagus is compatible with the there esophageal dysmotility and/or gastroesophageal reflux. Very small hiatal hernia. Status post cholecystectomy and hysterectomy. Electronically Signed   By: Inge Rise M.D.   On: 01/07/2016 12:52   Dg Sinus/fist Tube Chk-non Gi  02/03/2016  INDICATION: Status post percutaneous drainage of sigmoid diverticular abscess on 01/16/2016. There is no significant residual abscess remaining by CT on 01/27/2016 around the catheter. The drain was injected on that same date demonstrating some persistent abscess cavity superiorly without evidence of fistula to bowel. The drain has been left in place for an additional week and contrast injection is performed to evaluate for fistula prior to possible removal of the drain. EXAM: ABSCESS INJECTION MEDICATIONS: None ANESTHESIA/SEDATION: None CONTRAST:  5 mL Isovue 300 FLUOROSCOPY TIME:  24 seconds. COMPLICATIONS: None. PROCEDURE: Contrast was injected via the pre-existing right transgluteal abscess drainage catheter. Multiple fluoroscopic spot images were obtained. The drain was then aspirated. It was then cut and removed. A dressing was applied over the drain exit site. FINDINGS: Contrast injection demonstrates further retraction in size of the abscess cavity which now only conforms to the pigtail portion of the indwelling drainage catheter. There is no evidence of fistula to bowel. IMPRESSION: Abscess drainage catheter injection now shows a tiny residual cavity present conforming to the pigtail portion of the indwelling drain. There is no evidence of fistula to bowel. The catheter was removed today. Please see separately dictated Epic clinic note. Electronically Signed   By: Aletta Edouard  M.D.   On: 02/03/2016 14:37   Dg Sinus/fist Tube Chk-non Gi  01/28/2016  INDICATION: Abscess drain EXAM: ABSCESS INJECTION MEDICATIONS: The patient is currently admitted to the hospital and receiving intravenous antibiotics. The antibiotics were administered within an appropriate time frame prior to the initiation of the procedure. ANESTHESIA/SEDATION: None COMPLICATIONS: None immediate. PROCEDURE: Informed written consent was obtained from the patient after a thorough discussion of the procedural risks, benefits and alternatives. All questions were addressed. Maximal Sterile Barrier Technique was utilized including caps, mask, sterile gowns, sterile gloves, sterile drape, hand hygiene and skin antiseptic. A timeout was performed prior to the initiation of the procedure. Contrast was injected into the abscess drained and imaging was obtained. FINDINGS: The abscess is positioned in the pelvis. Contrast fills a decompressed potential space. This does extend superiorly. There is no evidence of fistula. IMPRESSION: The abscess has decompressed and is nearly healed. There is persistent potential space superiorly. There is no evidence of fistula. Electronically Signed   By: Marybelle Killings M.D.   On: 01/28/2016 09:21   Dg Abd 2 Views  01/14/2016  CLINICAL DATA:  Small bowel obstruction. EXAM: ABDOMEN - 2 VIEW COMPARISON:  01/13/2016 FINDINGS: The small bowel loops are diffusely dilated and measure up to 4 cm. No free intraperitoneal air identified. Nasogastric tube appears coiled in the stomach. IMPRESSION: Persistent abnormal bowel dilatation compatible with small bowel obstruction. This does not appear significantly improved from the previous exam. Electronically Signed   By: Kerby Moors M.D.   On: 01/14/2016 08:30   Dg Abd Acute W/chest  01/13/2016  CLINICAL DATA:  Initial evaluation for acute nausea, vomiting for 1 week. EXAM: DG ABDOMEN ACUTE W/ 1V CHEST COMPARISON:  None. FINDINGS: Cardiac and mediastinal  silhouettes are within normal limits. Elevation the right hemidiaphragm with associated right basilar atelectasis. Lungs are otherwise clear without focal infiltrate. No pulmonary edema. The possible small right pleural effusion. No pneumothorax. Multiple dilated gas-filled loops of small bowel seen scattered throughout the abdomen. Fluid levels on upright projection. Paucity of gas distally. Probable retained enteric contrast material within the colon. Finding suggestive of possible bowel obstruction. These loops measure up to 4.7 cm in diameter. No free air. No soft tissue mass or abnormal calcification. Cholecystectomy clips noted. No acute osseous abnormality. Degenerative changes about the hips and within the lower lumbar spine. IMPRESSION: 1. Multiple dilated loops of small bowel with associated fluid levels, suspicious for mechanical small bowel obstruction. 2. Elevation of the right hemidiaphragm with associated right basilar atelectasis. Probable small right pleural effusion. Electronically Signed   By: Jeannine Boga M.D.   On: 01/13/2016 01:02   Ct Image Guided Drainage Percut Cath  Peritoneal Retroperit  01/18/2016  CLINICAL DATA:  Diverticulitis with dominant cul-de-sac abscess. Multiple smaller extraluminal collections adjacent to the sigmoid colon, largest locule less than 2 cm, too small to accomodate drain catheter at this time. EXAM: CT GUIDED DRAINAGE OF pelvic ABSCESS ANESTHESIA/SEDATION: Intravenous Fentanyl and Versed were administered as conscious sedation during continuous monitoring of the patient's level of consciousness and physiological / cardiorespiratory status by the radiology RN, with a total moderate sedation time of 24 minutes. PROCEDURE: The procedure, risks, benefits, and alternatives were explained to the patient. Questions regarding the procedure were encouraged and answered. The patient understands and consents to the procedure. Patient placed prone. Select axial scans  through the lower pelvis obtained. The collection was localized an appropriate skin entry site was determined and marked. The operative field was prepped with chlorhexidinein a sterile fashion, and a sterile drape was applied covering the operative field. A sterile gown and sterile gloves were used for the procedure. Local anesthesia was provided with 1% Lidocaine. Under CT fluoroscopic guidance, percutaneous entry needle advanced into the collection. Purulent fluid returned. Amplatz wire advanced easily within the collection, its position confirmed on CT fluoro. Tract dilated to facilitate placement of a 12 French pigtail catheter, formed centrally within the collection. Approximately 20 mL of purulent material were aspirated, sent for Gram stain, culture and sensitivity. Catheter secured externally with 0 Prolene suture and StatLock and placed to gravity bag. The patient tolerated the procedure well. COMPLICATIONS: None immediate FINDINGS: The dominant cul-de-sac fluid collection was again localized. Drain catheter placed via right trans gluteal approach. 20 mL sample of the purulent aspirate sent for Gram stain, culture and sensitivity. IMPRESSION: 1. Technically successful CT-guided pelvic abscess drain catheter placement. Electronically Signed   By: Lucrezia Europe M.D.   On: 01/18/2016 08:21    Labs:  CBC:  Recent Labs  01/14/16 0408 01/17/16 0443 01/18/16 0352 01/20/16 0403  WBC 10.5 12.7* 10.0 6.4  HGB 11.7* 12.1 11.8* 11.3*  HCT 35.3* 36.0  34.8* 32.9*  PLT 288 340 273 263    COAGS: No results for input(s): INR, APTT in the last 8760 hours.  BMP:  Recent Labs  01/14/16 0408 01/17/16 0443 01/18/16 0352 01/20/16 0403  NA 133* 135 137 138  K 3.8 4.3 3.9 3.9  CL 100* 105 108 107  CO2 24 18* 22 27  GLUCOSE 98 81 111* 98  BUN 6 <5* <5* <5*  CALCIUM 8.0* 8.4* 8.4* 8.3*  CREATININE 0.60 0.69 0.58 0.62  GFRNONAA >60 >60 >60 >60  GFRAA >60 >60 >60 >60    LIVER FUNCTION  TESTS:  Recent Labs  01/07/16 1124  BILITOT 0.3  AST 25  ALT 20  ALKPHOS 67  PROT 7.5  ALBUMIN 4.5    Assessment and Plan:  There is no further significant output from the right transgluteal drainage catheter. Fluoroscopic contrast injection was performed today demonstrating further retraction in size of the abscess cavity that conforms to the pigtail portion of the drain. There is no evidence of fistula to bowel. The drain was removed today without difficulty. Mrs. Daddario will follow-up with Dr. Lucia Gaskins tomorrow.   Electronically SignedAletta Edouard T 02/03/2016, 3:38 PM     I spent a total of 15 Minutes in face to face in clinical consultation, greater than 50% of which was counseling/coordinating care for management of a diverticular abscess drain.

## 2016-03-29 ENCOUNTER — Ambulatory Visit (INDEPENDENT_AMBULATORY_CARE_PROVIDER_SITE_OTHER)
Admission: RE | Admit: 2016-03-29 | Discharge: 2016-03-29 | Disposition: A | Discharge status: Home / Self Care | Payer: BLUE CROSS/BLUE SHIELD | Source: Ambulatory Visit | Attending: Internal Medicine | Admitting: Internal Medicine

## 2016-03-29 DIAGNOSIS — R911 Solitary pulmonary nodule: Secondary | ICD-10-CM | POA: Diagnosis not present

## 2016-04-04 ENCOUNTER — Ambulatory Visit: Payer: BLUE CROSS/BLUE SHIELD | Admitting: Internal Medicine

## 2017-01-24 ENCOUNTER — Other Ambulatory Visit: Payer: Self-pay | Admitting: Surgery

## 2017-01-24 DIAGNOSIS — K5732 Diverticulitis of large intestine without perforation or abscess without bleeding: Secondary | ICD-10-CM

## 2017-01-27 ENCOUNTER — Ambulatory Visit
Admission: RE | Admit: 2017-01-27 | Discharge: 2017-01-27 | Disposition: A | Discharge status: Home / Self Care | Payer: BLUE CROSS/BLUE SHIELD | Source: Ambulatory Visit | Attending: Surgery | Admitting: Surgery

## 2017-01-27 DIAGNOSIS — K5732 Diverticulitis of large intestine without perforation or abscess without bleeding: Secondary | ICD-10-CM

## 2017-01-27 MED ORDER — IOPAMIDOL (ISOVUE-300) INJECTION 61%
100.0000 mL | Freq: Once | INTRAVENOUS | Status: AC | PRN
  Administered 2017-01-27: 100 mL via INTRAVENOUS

## 2017-06-15 ENCOUNTER — Ambulatory Visit
Admission: RE | Admit: 2017-06-15 | Discharge: 2017-06-15 | Disposition: A | Discharge status: Home / Self Care | Payer: BLUE CROSS/BLUE SHIELD | Source: Ambulatory Visit | Attending: Family Medicine | Admitting: Family Medicine

## 2017-06-15 ENCOUNTER — Other Ambulatory Visit: Payer: Self-pay | Admitting: Family Medicine

## 2017-06-15 DIAGNOSIS — M25551 Pain in right hip: Secondary | ICD-10-CM

## 2017-06-15 DIAGNOSIS — M25552 Pain in left hip: Principal | ICD-10-CM

## 2018-08-24 ENCOUNTER — Emergency Department (HOSPITAL_COMMUNITY)
Admission: EM | Admit: 2018-08-24 | Discharge: 2018-08-25 | Disposition: A | Discharge status: Home / Self Care | Payer: Managed Care, Other (non HMO) | Attending: Emergency Medicine | Admitting: Emergency Medicine

## 2018-08-24 ENCOUNTER — Other Ambulatory Visit: Payer: Self-pay

## 2018-08-24 ENCOUNTER — Encounter (HOSPITAL_COMMUNITY): Payer: Self-pay

## 2018-08-24 DIAGNOSIS — I10 Essential (primary) hypertension: Secondary | ICD-10-CM | POA: Diagnosis not present

## 2018-08-24 DIAGNOSIS — Z85828 Personal history of other malignant neoplasm of skin: Secondary | ICD-10-CM | POA: Diagnosis not present

## 2018-08-24 DIAGNOSIS — K5792 Diverticulitis of intestine, part unspecified, without perforation or abscess without bleeding: Secondary | ICD-10-CM | POA: Diagnosis not present

## 2018-08-24 DIAGNOSIS — R197 Diarrhea, unspecified: Secondary | ICD-10-CM | POA: Diagnosis not present

## 2018-08-24 DIAGNOSIS — E119 Type 2 diabetes mellitus without complications: Secondary | ICD-10-CM | POA: Diagnosis not present

## 2018-08-24 DIAGNOSIS — Z79899 Other long term (current) drug therapy: Secondary | ICD-10-CM | POA: Diagnosis not present

## 2018-08-24 DIAGNOSIS — R1110 Vomiting, unspecified: Secondary | ICD-10-CM | POA: Diagnosis present

## 2018-08-24 DIAGNOSIS — J45909 Unspecified asthma, uncomplicated: Secondary | ICD-10-CM | POA: Insufficient documentation

## 2018-08-24 LAB — CBC
HCT: 43.7 % (ref 36.0–46.0)
Hemoglobin: 14.7 g/dL (ref 12.0–15.0)
MCH: 28 pg (ref 26.0–34.0)
MCHC: 33.6 g/dL (ref 30.0–36.0)
MCV: 83.2 fL (ref 80.0–100.0)
Platelets: 201 10*3/uL (ref 150–400)
RBC: 5.25 MIL/uL — ABNORMAL HIGH (ref 3.87–5.11)
RDW: 12.9 % (ref 11.5–15.5)
WBC: 6.4 10*3/uL (ref 4.0–10.5)
nRBC: 0 % (ref 0.0–0.2)

## 2018-08-24 LAB — COMPREHENSIVE METABOLIC PANEL
ALK PHOS: 76 U/L (ref 38–126)
ALT: 18 U/L (ref 0–44)
AST: 22 U/L (ref 15–41)
Albumin: 4.3 g/dL (ref 3.5–5.0)
Anion gap: 13 (ref 5–15)
BUN: 12 mg/dL (ref 8–23)
CO2: 23 mmol/L (ref 22–32)
Calcium: 9.4 mg/dL (ref 8.9–10.3)
Chloride: 96 mmol/L — ABNORMAL LOW (ref 98–111)
Creatinine, Ser: 0.76 mg/dL (ref 0.44–1.00)
GFR calc Af Amer: >60 mL/min (ref >60)
GFR calc non Af Amer: >60 mL/min (ref >60)
Glucose, Bld: 104 mg/dL — ABNORMAL HIGH (ref 70–99)
Potassium: 3.8 mmol/L (ref 3.5–5.1)
Sodium: 132 mmol/L — ABNORMAL LOW (ref 135–145)
Total Bilirubin: 1 mg/dL (ref 0.3–1.2)
Total Protein: 7.7 g/dL (ref 6.5–8.1)

## 2018-08-24 LAB — URINALYSIS, ROUTINE W REFLEX MICROSCOPIC
Bacteria, UA: NONE SEEN
Bilirubin Urine: NEGATIVE
Glucose, UA: NEGATIVE mg/dL
Ketones, ur: 80 mg/dL — AB
Nitrite: NEGATIVE
Protein, ur: NEGATIVE mg/dL
Specific Gravity, Urine: 1.013 (ref 1.005–1.030)
pH: 5 (ref 5.0–8.0)

## 2018-08-24 LAB — LIPASE, BLOOD: Lipase: 34 U/L (ref 11–51)

## 2018-08-24 MED ORDER — LACTATED RINGERS IV BOLUS
1000.0000 mL | Freq: Once | INTRAVENOUS | Status: AC
  Administered 2018-08-25: 1000 mL via INTRAVENOUS

## 2018-08-24 MED ORDER — SODIUM CHLORIDE 0.9% FLUSH: 3.0000 mL | Freq: Once | INTRAVENOUS | Status: DC

## 2018-08-24 MED ORDER — ONDANSETRON HCL 4 MG/2ML IJ SOLN
4.0000 mg | Freq: Once | INTRAMUSCULAR | Status: AC
  Administered 2018-08-25: 4 mg via INTRAVENOUS
  Filled 2018-08-24: qty 2

## 2018-08-24 NOTE — ED Provider Notes (Signed)
Emergency Department Provider Note   I have reviewed the triage vital signs and the nursing notes.   HISTORY  Chief Complaint Emesis and Diarrhea   HPI Traci Hudson is a 63 y.o. female who presents the emergency department today for vomiting and diarrhea.  She states that she had diverticulitis in the past that started just like this.  She has had vomiting and diarrhea for 4 days.  She saw a tele-doc on Wednesday who started her on Tamiflu which did not seem to help.  Also to rotate Tylenol, ibuprofen and some type of nausea medicine.  She continues to have vomiting is nonbloody nonbilious and diarrhea is also nonbloody.  No fevers.  No significant abdominal pain. No other associated or modifying symptoms.    Past Medical History:  Diagnosis Date  . Asthma   . Diverticulitis   . DM (diabetes mellitus) ([place])   . Hypercholesteremia   . Hypertension   . Insomnia   . Skin cancer     Patient Active Problem List   Diagnosis Date Noted  . SBO (small bowel obstruction) ([place]) 01/13/2016  . Upper airway cough syndrome vs cough variant asthma 01/31/2015  . Solitary pulmonary nodule 01/31/2015  . Intermittent asthma, well controlled 01/31/2015    Past Surgical History:  Procedure Laterality Date  . ABDOMINAL HYSTERECTOMY    . APPENDECTOMY  1998  . BRAIN SURGERY  1969  . BREAST REDUCTION SURGERY  2000  . CHOLECYSTECTOMY    . VESICOVAGINAL FISTULA CLOSURE W/ TAH      Current Outpatient Rx  . Order #: [id number] Class: Historical Med  . Order #: [id number] Class: Historical Med  . Order #: [id number] Class: Historical Med  . Order #: [id number] Class: Historical Med  . Order #: [id number] Class: Historical Med  . Order #: [id number] Class: Historical Med  . Order #: [id number] Class: Historical Med  . Order #: [id number] Class: Historical Med  . Order #: [id number] Class: Print  . Order #: [id number] Class: Print  . Order #: [id number] Class: Normal    Allergies Codeine; Sulfa  antibiotics; and Tape  Family History  Problem Relation Age of Onset  . Emphysema Mother        smoked  . Lung cancer Mother        smoked    Social History Social History   Tobacco Use  . Smoking status: Never Smoker  . Smokeless tobacco: Never Used  Substance Use Topics  . Alcohol use: Yes    Alcohol/week: 2.0 standard drinks    Types: 2 Glasses of wine per week    Comment: occasionally  . Drug use: No    Review of Systems  All other systems negative except as documented in the HPI. All pertinent positives and negatives as reviewed in the HPI. ____________________________________________   PHYSICAL EXAM:  VITAL SIGNS: ED Triage Vitals  Enc Vitals Group     BP 08/24/18 1958 (!) 119/107     Pulse Rate 08/24/18 1958 96     Resp 08/24/18 1958 18     Temp 08/24/18 1958 98.8 F (37.1 C)     Temp Source 08/24/18 1958 Oral     SpO2 08/24/18 1958 98 %     Weight 08/24/18 1958 156 lb (70.8 kg)    Constitutional: Alert and oriented. Well appearing and in no acute distress. Eyes: Conjunctivae are normal. PERRL. EOMI. Head: Atraumatic. Nose: No congestion/rhinnorhea. Mouth/Throat: Mucous membranes are moist.  Oropharynx non-erythematous. Neck: No stridor.  No meningeal signs.   Cardiovascular: Normal rate,  regular rhythm. Good peripheral circulation. Grossly normal heart sounds.   Respiratory: Normal respiratory effort.  No retractions. Lungs CTAB. Gastrointestinal: soft with mild diffuse tenderness. No distention.  Musculoskeletal: No lower extremity tenderness nor edema. No gross deformities of extremities. Neurologic:  Normal speech and language. No gross focal neurologic deficits are appreciated.  Skin:  Skin is warm, dry and intact. No rash noted.  ____________________________________________   LABS (all labs ordered are listed, but only abnormal results are displayed)  Labs Reviewed  COMPREHENSIVE METABOLIC PANEL - Abnormal; Notable for the following  components:      Result Value   Sodium 132 (*)    Chloride 96 (*)    Glucose, Bld 104 (*)    All other components within normal limits  CBC - Abnormal; Notable for the following components:   RBC 5.25 (*)    All other components within normal limits  URINALYSIS, ROUTINE W REFLEX MICROSCOPIC - Abnormal; Notable for the following components:   Hgb urine dipstick SMALL (*)    Ketones, ur 80 (*)    Leukocytes, UA MODERATE (*)    All other components within normal limits  URINE CULTURE  LIPASE, BLOOD   ____________________________________________  RADIOLOGY  Ct Abdomen Pelvis W Contrast  Result Date: 08/25/2018 CLINICAL DATA:  History of diverticulitis. Abdominal pain and infection. History of vesicovaginal fistula. EXAM: CT ABDOMEN AND PELVIS WITH CONTRAST TECHNIQUE: Multidetector CT imaging of the abdomen and pelvis was performed using the standard protocol following bolus administration of intravenous contrast. CONTRAST:  128mL ISOVUE-300 IOPAMIDOL (ISOVUE-300) INJECTION 61% COMPARISON:  01/27/2017 FINDINGS: Lower chest: Clear lung bases. Normal heart size without pericardial or pleural effusion. Hepatobiliary: Normal liver. Cholecystectomy, without biliary ductal dilatation. Pancreas: Normal, without mass or ductal dilatation. Spleen: Normal in size, without focal abnormality. Adrenals/Urinary Tract: Normal adrenal glands. Normal kidneys, without hydronephrosis. Normal urinary bladder. Stomach/Bowel: Tiny hiatal hernia.  Gastric antral underdistention. Extensive colonic diverticulosis. Wall thickening within the sigmoid is primarily felt to be secondary to to muscular hypertrophy. There may be mild inflammation adjacent to diverticula in this region, including on image 62/2 and coronal image 53. Normal terminal ileum. Probable appendectomy. Normal small bowel. No free intraperitoneal air. Vascular/Lymphatic: Normal caliber of the aorta and branch vessels. No abdominopelvic adenopathy.  Reproductive: Hysterectomy.  No adnexal mass. Other: No significant free fluid. Musculoskeletal: Mild bilateral hip osteoarthritis. IMPRESSION: 1. Colonic diverticulosis with suspicion of mild non complicated sigmoid diverticulitis. 2. No other explanation for abdominal pain. 3.  Tiny hiatal hernia. Electronically Signed   By: Abigail Miyamoto M.D.   On: 08/25/2018 01:43    ____________________________________________   PROCEDURES  Procedure(s) performed:   Procedures   ____________________________________________   INITIAL IMPRESSION / ASSESSMENT AND PLAN / ED COURSE  Will eval for diverticulitis per patient request. Symptomatic treatment otherwise.   Ct w/ uncomplicated diverticulitis.  Patient still without any real pain just with upset stomach.  Will start antibiotics and give her prescription for antiemetics as well.  No evidence of sepsis or complication on CT scan no indication for admission at this time.  Will return if any worsening symptoms or not improving as expected.   Pertinent labs & imaging results that were available during my care of the patient were reviewed by me and considered in my medical decision making (see chart for details).  ____________________________________________  FINAL CLINICAL IMPRESSION(S) / ED DIAGNOSES  Final diagnoses:  Diverticulitis     MEDICATIONS GIVEN DURING THIS VISIT:  Medications  sodium chloride flush (NS)  0.9 % injection 3 mL (has no administration in time range)  iopamidol (ISOVUE-300) 61 % injection (has no administration in time range)  sodium chloride (PF) 0.9 % injection (has no administration in time range)  ciprofloxacin (CIPRO) tablet 500 mg (has no administration in time range)  metroNIDAZOLE (FLAGYL) tablet 500 mg (has no administration in time range)  promethazine (PHENERGAN) tablet 12.5 mg (has no administration in time range)  lactated ringers bolus 1,000 mL (0 mLs Intravenous Stopped 08/25/18 0147)  ondansetron  (ZOFRAN) injection 4 mg (4 mg Intravenous Given 08/25/18 0019)  iopamidol (ISOVUE-300) 61 % injection 100 mL (100 mLs Intravenous Contrast Given 08/25/18 0052)     NEW OUTPATIENT MEDICATIONS STARTED DURING THIS VISIT:  New Prescriptions   CIPROFLOXACIN (CIPRO) 500 MG TABLET    Take 1 tablet (500 mg total) by mouth 2 (two) times daily. One po bid x 7 days   METRONIDAZOLE (FLAGYL) 500 MG TABLET    Take 1 tablet (500 mg total) by mouth 2 (two) times daily. One po bid x 7 days   PROMETHAZINE (PHENERGAN) 25 MG TABLET    Take 1 tablet (25 mg total) by mouth every 6 (six) hours as needed for nausea or vomiting.    Note:  This note was prepared with assistance of Dragon voice recognition software. Occasional wrong-word or sound-a-like substitutions may have occurred due to the inherent limitations of voice recognition software.   Aftin Lye, Corene Cornea, MD 08/25/18 0330

## 2018-08-24 NOTE — ED Triage Notes (Signed)
Pt reports vomiting and diarrhea. Denies pain or bloody stools. Pt has hx of diverticulitis.

## 2018-08-25 ENCOUNTER — Encounter (HOSPITAL_COMMUNITY): Payer: Self-pay

## 2018-08-25 ENCOUNTER — Emergency Department (HOSPITAL_COMMUNITY): Payer: Managed Care, Other (non HMO)

## 2018-08-25 MED ORDER — PROMETHAZINE HCL 25 MG PO TABS: 25.0000 mg | ORAL_TABLET | Freq: Four times a day (QID) | ORAL | 0 refills | Status: DC | PRN

## 2018-08-25 MED ORDER — CIPROFLOXACIN HCL 500 MG PO TABS
500.0000 mg | ORAL_TABLET | Freq: Once | ORAL | Status: AC
  Administered 2018-08-25: 500 mg via ORAL
  Filled 2018-08-25: qty 1

## 2018-08-25 MED ORDER — CIPROFLOXACIN HCL 500 MG PO TABS: 500.0000 mg | ORAL_TABLET | Freq: Two times a day (BID) | ORAL | 0 refills | Status: DC

## 2018-08-25 MED ORDER — SODIUM CHLORIDE (PF) 0.9 % IJ SOLN
INTRAMUSCULAR | Status: AC
  Filled 2018-08-25: qty 50

## 2018-08-25 MED ORDER — PROMETHAZINE HCL 25 MG PO TABS
12.5000 mg | ORAL_TABLET | Freq: Once | ORAL | Status: AC
  Administered 2018-08-25: 12.5 mg via ORAL
  Filled 2018-08-25: qty 1

## 2018-08-25 MED ORDER — IOPAMIDOL (ISOVUE-300) INJECTION 61%
100.0000 mL | Freq: Once | INTRAVENOUS | Status: AC | PRN
  Administered 2018-08-25: 100 mL via INTRAVENOUS

## 2018-08-25 MED ORDER — METRONIDAZOLE 500 MG PO TABS: 500.0000 mg | ORAL_TABLET | Freq: Two times a day (BID) | ORAL | 0 refills | Status: DC

## 2018-08-25 MED ORDER — IOPAMIDOL (ISOVUE-300) INJECTION 61%
INTRAVENOUS | Status: AC
  Filled 2018-08-25: qty 100

## 2018-08-25 MED ORDER — METRONIDAZOLE 500 MG PO TABS
500.0000 mg | ORAL_TABLET | Freq: Once | ORAL | Status: AC
  Administered 2018-08-25: 500 mg via ORAL
  Filled 2018-08-25: qty 1

## 2018-08-26 LAB — URINE CULTURE

## 2019-09-27 NOTE — Patient Instructions (Addendum)
DUE TO COVID-19 ONLY ONE VISITOR IS ALLOWED TO COME WITH YOU AND STAY IN THE WAITING ROOM ONLY DURING PRE OP AND PROCEDURE DAY OF SURGERY. THE 1 VISITOR MAY VISIT WITH YOU AFTER SURGERY IN YOUR PRIVATE ROOM DURING VISITING HOURS ONLY!  YOU NEED TO HAVE A COVID 19 TEST ON_______ @_______ , THIS TEST MUST BE DONE BEFORE SURGERY, COME  Traci Hudson , [id number].  ([place]) ONCE YOUR COVID TEST IS COMPLETED, PLEASE BEGIN THE QUARANTINE INSTRUCTIONS AS OUTLINED IN YOUR HANDOUT.                Traci Hudson  09/27/2019   Your procedure is scheduled on: 10-08-19    Report to [facility] Main  Entrance    Report to Admitting at 6:10 AM     Call this number if you have problems the morning of surgery [phone]    Remember: NO SOLID FOOD AFTER MIDNIGHT THE NIGHT PRIOR TO SURGERY. NOTHING BY MOUTH EXCEPT CLEAR LIQUIDS UNTIL 5:40 AM . PLEASE FINISH ENSURE DRINK PER SURGEON ORDER  WHICH NEEDS TO BE COMPLETED AT  5:40 AM.   CLEAR LIQUID DIET   Foods Allowed                                                                     Foods Excluded  Coffee and tea, regular and decaf                             liquids that you cannot  Plain Jell-O any favor except red or purple                                           see through such as: Fruit ices (not with fruit pulp)                                     milk, soups, orange juice  Iced Popsicles                                    All solid food Carbonated beverages, regular and diet                                    Cranberry, grape and apple juices Sports drinks like Gatorade Lightly seasoned clear broth or consume(fat free) Sugar, honey syrup   _____________________________________________________________________        Take these medicines the morning of surgery with A SIP OF WATER: Loratadine (Claritin) prn, Montelukast (Singulair) prn, Omeprazole (Prilosec), and Sertraline (Zoloft). You may also  use and bring your eyedrops  BRUSH YOUR TEETH MORNING OF SURGERY AND RINSE YOUR MOUTH OUT, NO CHEWING GUM CANDY OR MINTS.  You may not have any metal on your body including hair pins and              piercings     Do not wear jewelry, make-up, lotions, powders or perfumes, deodorant              Do not wear nail polish on your fingernails.  Do not shave  48 hours prior to surgery.               Do not bring valuables to the hospital. [place].  Contacts, dentures or bridgework may not be worn into surgery.  You may bring an overnight bag    Special Instructions: N/A              Please read over the following fact sheets you were given: _____________________________________________________________________             [facility] - Preparing for Surgery Before surgery, you can play an important role.  Because skin is not sterile, your skin needs to be as free of germs as possible.  You can reduce the number of germs on your skin by washing with CHG (chlorahexidine gluconate) soap before surgery.  CHG is an antiseptic cleaner which kills germs and bonds with the skin to continue killing germs even after washing. Please DO NOT use if you have an allergy to CHG or antibacterial soaps.  If your skin becomes reddened/irritated stop using the CHG and inform your nurse when you arrive at Short Stay. Do not shave (including legs and underarms) for at least 48 hours prior to the first CHG shower.  You may shave your face/neck. Please follow these instructions carefully:  1.  Shower with CHG Soap the night before surgery and the  morning of Surgery.  2.  If you choose to wash your hair, wash your hair first as usual with your  normal  shampoo.  3.  After you shampoo, rinse your hair and body thoroughly to remove the  shampoo.                           4.  Use CHG as you would any other liquid soap.  You can apply  chg directly  to the skin and wash                       Gently with a scrungie or clean washcloth.  5.  Apply the CHG Soap to your body ONLY FROM THE NECK DOWN.   Do not use on face/ open                           Wound or open sores. Avoid contact with eyes, ears mouth and genitals (private parts).                       Wash face,  Genitals (private parts) with your normal soap.             6.  Wash thoroughly, paying special attention to the area where your surgery  will be performed.  7.  Thoroughly rinse your body with warm water from the neck down.  8.  DO NOT shower/wash with your normal soap after using and rinsing off  the CHG  Soap.                9.  Pat yourself dry with a clean towel.            10.  Wear clean pajamas.            11.  Place clean sheets on your bed the night of your first shower and do not  sleep with pets. Day of Surgery : Do not apply any lotions/deodorants the morning of surgery.  Please wear clean clothes to the hospital/surgery center.  FAILURE TO FOLLOW THESE INSTRUCTIONS MAY RESULT IN THE CANCELLATION OF YOUR SURGERY PATIENT SIGNATURE_________________________________  NURSE SIGNATURE__________________________________  ________________________________________________________________________   Traci Hudson  An incentive spirometer is a tool that can help keep your lungs clear and active. This tool measures how well you are filling your lungs with each breath. Taking long deep breaths may help reverse or decrease the chance of developing breathing (pulmonary) problems (especially infection) following:  A long period of time when you are unable to move or be active. BEFORE THE PROCEDURE   If the spirometer includes an indicator to show your best effort, your nurse or respiratory therapist will set it to a desired goal.  If possible, sit up straight or lean slightly forward. Try not to slouch.  Hold the incentive spirometer in an upright  position. INSTRUCTIONS FOR USE  1. Sit on the edge of your bed if possible, or sit up as far as you can in bed or on a chair. 2. Hold the incentive spirometer in an upright position. 3. Breathe out normally. 4. Place the mouthpiece in your mouth and seal your lips tightly around it. 5. Breathe in slowly and as deeply as possible, raising the piston or the ball toward the top of the column. 6. Hold your breath for 3-5 seconds or for as long as possible. Allow the piston or ball to fall to the bottom of the column. 7. Remove the mouthpiece from your mouth and breathe out normally. 8. Rest for a few seconds and repeat Steps 1 through 7 at least 10 times every 1-2 hours when you are awake. Take your time and take a few normal breaths between deep breaths. 9. The spirometer may include an indicator to show your best effort. Use the indicator as a goal to work toward during each repetition. 10. After each set of 10 deep breaths, practice coughing to be sure your lungs are clear. If you have an incision (the cut made at the time of surgery), support your incision when coughing by placing a pillow or rolled up towels firmly against it. Once you are able to get out of bed, walk around indoors and cough well. You may stop using the incentive spirometer when instructed by your caregiver.  RISKS AND COMPLICATIONS  Take your time so you do not get dizzy or light-headed.  If you are in pain, you may need to take or ask for pain medication before doing incentive spirometry. It is harder to take a deep breath if you are having pain. AFTER USE  Rest and breathe slowly and easily.  It can be helpful to keep track of a log of your progress. Your caregiver can provide you with a simple table to help with this. If you are using the spirometer at home, follow these instructions: [place] IF:   You are having difficultly using the spirometer.  You have trouble using the spirometer as often as  instructed.  Your pain medication is not giving enough relief while using the spirometer.  You develop fever of 100.5 F (38.1 C) or higher. SEEK IMMEDIATE MEDICAL CARE IF:   You cough up bloody sputum that had not been present before.  You develop fever of 102 F (38.9 C) or greater.  You develop worsening pain at or near the incision site. MAKE SURE YOU:   Understand these instructions.  Will watch your condition.  Will get help right away if you are not doing well or get worse. Document Released: 12/05/2006 Document Revised: 10/17/2011 Document Reviewed: 02/05/2007 ExitCare Patient Information 2014 [employer], [place].   ________________________________________________________________________  WHAT IS A BLOOD TRANSFUSION? Blood Transfusion Information  A transfusion is the replacement of blood or some of its parts. Blood is made up of multiple cells which provide different functions.  Red blood cells carry oxygen and are used for blood loss replacement.  White blood cells fight against infection.  Platelets control bleeding.  Plasma helps clot blood.  Other blood products are available for specialized needs, such as hemophilia or other clotting disorders. BEFORE THE TRANSFUSION  Who gives blood for transfusions?   Healthy volunteers who are fully evaluated to make sure their blood is safe. This is blood bank blood. Transfusion therapy is the safest it has ever been in the practice of medicine. Before blood is taken from a donor, a complete history is taken to make sure that person has no history of diseases nor engages in risky social behavior (examples are intravenous drug use or sexual activity with multiple partners). The donor's travel history is screened to minimize risk of transmitting infections, such as malaria. The donated blood is tested for signs of infectious diseases, such as HIV and hepatitis. The blood is then tested to be sure it is compatible with you in  order to minimize the chance of a transfusion reaction. If you or a relative donates blood, this is often done in anticipation of surgery and is not appropriate for emergency situations. It takes many days to process the donated blood. RISKS AND COMPLICATIONS Although transfusion therapy is very safe and saves many lives, the main dangers of transfusion include:   Getting an infectious disease.  Developing a transfusion reaction. This is an allergic reaction to something in the blood you were given. Every precaution is taken to prevent this. The decision to have a blood transfusion has been considered carefully by your caregiver before blood is given. Blood is not given unless the benefits outweigh the risks. AFTER THE TRANSFUSION  Right after receiving a blood transfusion, you will usually feel much better and more energetic. This is especially true if your red blood cells have gotten low (anemic). The transfusion raises the level of the red blood cells which carry oxygen, and this usually causes an energy increase.  The nurse administering the transfusion will monitor you carefully for complications. HOME CARE INSTRUCTIONS  No special instructions are needed after a transfusion. You may find your energy is better. Speak with your caregiver about any limitations on activity for underlying diseases you may have. SEEK MEDICAL CARE IF:   Your condition is not improving after your transfusion.  You develop redness or irritation at the intravenous (IV) site. SEEK IMMEDIATE MEDICAL CARE IF:  Any of the following symptoms occur over the next 12 hours:  Shaking chills.  You have a temperature by mouth above 102 F (38.9 C), not controlled by medicine.  Chest, back, or muscle pain.  People around you feel you are not acting correctly or are confused.  Shortness of breath or difficulty breathing.  Dizziness and fainting.  You get a rash or develop hives.  You have a decrease in urine  output.  Your urine turns a dark color or changes to pink, red, or brown. Any of the following symptoms occur over the next 10 days:  You have a temperature by mouth above 102 F (38.9 C), not controlled by medicine.  Shortness of breath.  Weakness after normal activity.  The white part of the eye turns yellow (jaundice).  You have a decrease in the amount of urine or are urinating less often.  Your urine turns a dark color or changes to pink, red, or brown. Document Released: 07/22/2000 Document Revised: 10/17/2011 Document Reviewed: 03/10/2008 [facility] Patient Information 2014 [place], [place].  _______________________________________________________________________

## 2019-09-27 NOTE — Progress Notes (Addendum)
PCP - London Pepper, MD w/clearance on 09-19-19 Cardiologist -   Chest x-ray -  EKG Stress Test -  ECHO -  Cardiac Cath -   Sleep Study -  CPAP -   HGA1C 09-19-19 on chart Fasting Blood Sugar -  Checks Blood Sugar _____ times a day  Blood Thinner Instructions: Aspirin Instructions: Last Dose:  Anesthesia review:   Patient denies shortness of breath, fever, cough and chest pain at PAT appointment   Patient verbalized understanding of instructions that were given to them at the PAT appointment. Patient was also instructed that they will need to review over the PAT instructions again at home before surgery.

## 2019-10-02 ENCOUNTER — Encounter (HOSPITAL_COMMUNITY): Payer: Self-pay

## 2019-10-02 ENCOUNTER — Other Ambulatory Visit: Payer: Self-pay

## 2019-10-02 ENCOUNTER — Encounter (HOSPITAL_COMMUNITY)
Admission: RE | Admit: 2019-10-02 | Discharge: 2019-10-02 | Disposition: A | Discharge status: Home / Self Care | Payer: Medicare Other | Source: Ambulatory Visit | Attending: Orthopedic Surgery | Admitting: Orthopedic Surgery

## 2019-10-02 DIAGNOSIS — Z01812 Encounter for preprocedural laboratory examination: Secondary | ICD-10-CM | POA: Insufficient documentation

## 2019-10-02 DIAGNOSIS — Z01818 Encounter for other preprocedural examination: Secondary | ICD-10-CM | POA: Diagnosis not present

## 2019-10-02 DIAGNOSIS — I10 Essential (primary) hypertension: Secondary | ICD-10-CM | POA: Insufficient documentation

## 2019-10-02 DIAGNOSIS — Z01810 Encounter for preprocedural cardiovascular examination: Secondary | ICD-10-CM | POA: Insufficient documentation

## 2019-10-02 HISTORY — DX: Prediabetes: R73.03

## 2019-10-02 LAB — BASIC METABOLIC PANEL
Anion gap: 10 (ref 5–15)
BUN: 9 mg/dL (ref 8–23)
CO2: 23 mmol/L (ref 22–32)
Calcium: 9.9 mg/dL (ref 8.9–10.3)
Chloride: 101 mmol/L (ref 98–111)
Creatinine, Ser: 0.72 mg/dL (ref 0.44–1.00)
GFR calc Af Amer: >60 mL/min (ref >60)
GFR calc non Af Amer: >60 mL/min (ref >60)
Glucose, Bld: 134 mg/dL — ABNORMAL HIGH (ref 70–99)
Potassium: 4.3 mmol/L (ref 3.5–5.1)
Sodium: 134 mmol/L — ABNORMAL LOW (ref 135–145)

## 2019-10-02 LAB — CBC
HCT: 42.5 % (ref 36.0–46.0)
Hemoglobin: 14.2 g/dL (ref 12.0–15.0)
MCH: 27.4 pg (ref 26.0–34.0)
MCHC: 33.4 g/dL (ref 30.0–36.0)
MCV: 81.9 fL (ref 80.0–100.0)
Platelets: 264 10*3/uL (ref 150–400)
RBC: 5.19 MIL/uL — ABNORMAL HIGH (ref 3.87–5.11)
RDW: 12.9 % (ref 11.5–15.5)
WBC: 6.8 10*3/uL (ref 4.0–10.5)
nRBC: 0 % (ref 0.0–0.2)

## 2019-10-02 LAB — ABO/RH: ABO/RH(D): A POS

## 2019-10-02 NOTE — Progress Notes (Signed)
+  COVID 19 results documentation on chart from [place], [employer]

## 2019-10-03 LAB — SURGICAL PCR SCREEN
MRSA, PCR: POSITIVE — AB
Staphylococcus aureus: POSITIVE — AB

## 2019-10-03 NOTE — Progress Notes (Signed)
PCR results 10/03/19 sent to Dr. Aurea Graff office via epic

## 2019-10-06 NOTE — H&P (Signed)
TOTAL HIP ADMISSION H&P  Patient is admitted for left total hip arthroplasty, anterior approach.  Subjective:  Chief Complaint:   Left hip primary OA / pain  HPI: Traci Hudson, 64 y.o. female, has a history of pain and functional disability in the left hip(s) due to arthritis and patient has failed non-surgical conservative treatments for greater than 12 weeks to include NSAID's and/or analgesics, corticosteriod injections and activity modification.  Onset of symptoms was gradual starting 2-3 years ago with gradually worsening course since that time.The patient noted no past surgery on the left hip(s).  Patient currently rates pain in the left hip at 9 out of 10 with activity. Patient has night pain, worsening of pain with activity and weight bearing, trendelenberg gait, pain that interfers with activities of daily living and pain with passive range of motion. Patient has evidence of periarticular osteophytes and joint space narrowing by imaging studies. This condition presents safety issues increasing the risk of falls. There is no current active infection.  Risks, benefits and expectations were discussed with the patient.  Risks including but not limited to the risk of anesthesia, blood clots, nerve damage, blood vessel damage, failure of the prosthesis, infection and up to and including death.  Patient understand the risks, benefits and expectations and wishes to proceed with surgery.   PCP: London Pepper, MD  D/C Plans:       Home  Post-op Meds:       Rx given for ASA, Robaxin, Norco, Iron, Colace and MiraLax  Tranexamic Acid:      To be given - IV   Decadron:      Is to be given  FYI:      ASA  Norco (patient stated ok, even with codeine listed as an allergy)  DME:    Rx sent for - RW & 3-n-1  PT:   HEP  Pharmacy: [place], [place]   Patient Active Problem List   Diagnosis Date Noted  . SBO (small bowel obstruction) ([place]) 01/13/2016  . Upper airway cough  syndrome vs cough variant asthma 01/31/2015  . Solitary pulmonary nodule 01/31/2015  . Intermittent asthma, well controlled 01/31/2015   Past Medical History:  Diagnosis Date  . Asthma   . Diverticulitis   . Hypercholesteremia   . Hypertension   . Insomnia   . Pre-diabetes   . Skin cancer     Past Surgical History:  Procedure Laterality Date  . ABDOMINAL HYSTERECTOMY    . APPENDECTOMY  1998  . BRAIN SURGERY  1969  . BREAST REDUCTION SURGERY  2000  . CHOLECYSTECTOMY    . VESICOVAGINAL FISTULA CLOSURE W/ TAH      No current facility-administered medications for this encounter.   Current Outpatient Medications  Medication Sig Dispense Refill Last Dose  . acetaminophen (TYLENOL) 325 MG tablet Take 650 mg by mouth every 6 (six) hours as needed for moderate pain or headache.     . albuterol (PROVENTIL HFA;VENTOLIN HFA) 108 (90 Base) MCG/ACT inhaler Inhale 1-2 puffs into the lungs every 6 (six) hours as needed for wheezing or shortness of breath.      . Bepotastine Besilate (BEPREVE) 1.5 % SOLN Place 1 drop into both eyes daily as needed (itchy eyes).     . diphenhydramine-acetaminophen (TYLENOL PM) 25-500 MG TABS tablet Take 1 tablet by mouth at bedtime as needed (sleep).     . EPINEPHrine (EPIPEN 2-PAK) 0.3 mg/0.3 mL IJ SOAJ injection Inject 0.3 mg  into the muscle as needed for anaphylaxis.     . fluticasone (FLONASE) 50 MCG/ACT nasal spray Place 2 sprays into both nostrils daily.   4   . loratadine (CLARITIN) 10 MG tablet Take 10 mg by mouth daily as needed for allergies.      . montelukast (SINGULAIR) 10 MG tablet Take 10 mg by mouth daily as needed (allergies).      . Olopatadine HCl 0.6 % SOLN Place 2 sprays into both nostrils daily.      Marland Kitchen omeprazole (PRILOSEC) 20 MG capsule Take 20 mg by mouth daily as needed (acid reflux).     . sertraline (ZOLOFT) 100 MG tablet Take 150 mg by mouth daily.   3   . valACYclovir (VALTREX) 1000 MG tablet Take 2,000 mg by mouth daily as needed  (cold sores).     . zolpidem (AMBIEN) 5 MG tablet Take 2.5 mg by mouth at bedtime as needed for sleep.      . Misc Natural Products (GLUCOS-CHONDROIT-MSM COMPLEX) TABS Take 3 tablets by mouth daily.      Allergies  Allergen Reactions  . Codeine Itching  . Sulfa Antibiotics     GI upset   . Tape Itching and Rash    Social History   Tobacco Use  . Smoking status: Never Smoker  . Smokeless tobacco: Never Used  Substance Use Topics  . Alcohol use: Yes    Alcohol/week: 2.0 standard drinks    Types: 2 Glasses of wine per week    Comment: occasionally    Family History  Problem Relation Age of Onset  . Emphysema Mother        smoked  . Lung cancer Mother        smoked     Review of Systems  Constitutional: Negative.   HENT: Negative.   Eyes: Negative.   Respiratory: Negative.   Cardiovascular: Negative.   Gastrointestinal: Negative.   Genitourinary: Negative.   Musculoskeletal: Positive for joint pain.  Skin: Negative.   Neurological: Negative.   Endo/Heme/Allergies: Negative.   Psychiatric/Behavioral: Negative.      Objective:  Physical Exam  Constitutional: She is oriented to person, place, and time. She appears well-developed.  HENT:  Head: Normocephalic.  Eyes: Pupils are equal, round, and reactive to light.  Neck: No JVD present. No tracheal deviation present. No thyromegaly present.  Cardiovascular: Normal rate, regular rhythm and intact distal pulses.  Respiratory: Effort normal and breath sounds normal. No respiratory distress. She has no wheezes.  GI: Soft. There is no abdominal tenderness. There is no guarding.  Musculoskeletal:     Cervical back: Neck supple.     Left hip: Tenderness and bony tenderness present. No swelling, deformity or lacerations. Decreased range of motion. Decreased strength.  Lymphadenopathy:    She has no cervical adenopathy.  Neurological: She is alert and oriented to person, place, and time.  Skin: Skin is warm and dry.   Psychiatric: She has a normal mood and affect.     Labs:  Estimated body mass index is 27.91 kg/m as calculated from the following:   Height as of 10/02/19: 5\' 4"  (1.626 m).   Weight as of 10/02/19: 73.8 kg.   Imaging Review Plain radiographs demonstrate severe degenerative joint disease of the left hip(s). The bone quality appears to be good for age and reported activity level.      Assessment/Plan:  End stage arthritis, left hip(s)  The patient history, physical examination, clinical judgement of the provider  and imaging studies are consistent with end stage degenerative joint disease of the left hip(s) and total hip arthroplasty is deemed medically necessary. The treatment options including medical management, injection therapy, arthroscopy and arthroplasty were discussed at length. The risks and benefits of total hip arthroplasty were presented and reviewed. The risks due to aseptic loosening, infection, stiffness, dislocation/subluxation,  thromboembolic complications and other imponderables were discussed.  The patient acknowledged the explanation, agreed to proceed with the plan and consent was signed. Patient is being admitted for treatment for surgery, pain control, PT, OT, prophylactic antibiotics, VTE prophylaxis, progressive ambulation and ADL's and discharge planning.The patient is planning to be discharged home.   West Pugh Talaysia Pinheiro   PA-C  10/06/2019, 12:16 PM

## 2019-10-08 ENCOUNTER — Ambulatory Visit (HOSPITAL_COMMUNITY): Payer: Medicare Other | Admitting: Physician Assistant

## 2019-10-08 ENCOUNTER — Other Ambulatory Visit: Payer: Self-pay

## 2019-10-08 ENCOUNTER — Encounter (HOSPITAL_COMMUNITY): Payer: Self-pay | Admitting: Orthopedic Surgery

## 2019-10-08 ENCOUNTER — Encounter (HOSPITAL_COMMUNITY)
Admission: RE | Disposition: A | Discharge status: Home / Self Care | Payer: Self-pay | Source: Home / Self Care | Attending: Orthopedic Surgery

## 2019-10-08 ENCOUNTER — Ambulatory Visit (HOSPITAL_COMMUNITY): Payer: Medicare Other

## 2019-10-08 ENCOUNTER — Ambulatory Visit (HOSPITAL_COMMUNITY): Payer: Medicare Other | Admitting: Certified Registered Nurse Anesthetist

## 2019-10-08 ENCOUNTER — Observation Stay (HOSPITAL_COMMUNITY)
Admission: RE | Admit: 2019-10-08 | Discharge: 2019-10-09 | Disposition: A | Discharge status: Home / Self Care | Payer: Medicare Other | Attending: Orthopedic Surgery | Admitting: Orthopedic Surgery

## 2019-10-08 DIAGNOSIS — I10 Essential (primary) hypertension: Secondary | ICD-10-CM | POA: Insufficient documentation

## 2019-10-08 DIAGNOSIS — Z85828 Personal history of other malignant neoplasm of skin: Secondary | ICD-10-CM | POA: Insufficient documentation

## 2019-10-08 DIAGNOSIS — Z79899 Other long term (current) drug therapy: Secondary | ICD-10-CM | POA: Diagnosis not present

## 2019-10-08 DIAGNOSIS — Z96642 Presence of left artificial hip joint: Secondary | ICD-10-CM | POA: Diagnosis present

## 2019-10-08 DIAGNOSIS — E7800 Pure hypercholesterolemia, unspecified: Secondary | ICD-10-CM | POA: Insufficient documentation

## 2019-10-08 DIAGNOSIS — M1612 Unilateral primary osteoarthritis, left hip: Secondary | ICD-10-CM | POA: Diagnosis not present

## 2019-10-08 DIAGNOSIS — J4520 Mild intermittent asthma, uncomplicated: Secondary | ICD-10-CM | POA: Diagnosis not present

## 2019-10-08 DIAGNOSIS — G4700 Insomnia, unspecified: Secondary | ICD-10-CM | POA: Diagnosis not present

## 2019-10-08 DIAGNOSIS — Z419 Encounter for procedure for purposes other than remedying health state, unspecified: Secondary | ICD-10-CM

## 2019-10-08 DIAGNOSIS — E663 Overweight: Secondary | ICD-10-CM | POA: Insufficient documentation

## 2019-10-08 DIAGNOSIS — Z6827 Body mass index (BMI) 27.0-27.9, adult: Secondary | ICD-10-CM | POA: Insufficient documentation

## 2019-10-08 DIAGNOSIS — Z96649 Presence of unspecified artificial hip joint: Secondary | ICD-10-CM

## 2019-10-08 HISTORY — PX: TOTAL HIP ARTHROPLASTY: SHX124

## 2019-10-08 LAB — GLUCOSE, CAPILLARY: Glucose-Capillary: 125 mg/dL — ABNORMAL HIGH (ref 70–99)

## 2019-10-08 LAB — TYPE AND SCREEN
ABO/RH(D): A POS
Antibody Screen: NEGATIVE

## 2019-10-08 SURGERY — ARTHROPLASTY, HIP, TOTAL, ANTERIOR APPROACH
Anesthesia: Spinal | Site: Hip | Laterality: Left

## 2019-10-08 MED ORDER — CHLORHEXIDINE GLUCONATE 4 % EX LIQD: 60.0000 mL | Freq: Once | CUTANEOUS | Status: DC

## 2019-10-08 MED ORDER — HYDROMORPHONE HCL 1 MG/ML IJ SOLN
INTRAMUSCULAR | Status: AC
  Administered 2019-10-08: 11:00:00 0.5 mg via INTRAVENOUS
  Filled 2019-10-08: qty 1

## 2019-10-08 MED ORDER — MEPIVACAINE HCL (PF) 2 % IJ SOLN
INTRAMUSCULAR | Status: AC
  Filled 2019-10-08: qty 40

## 2019-10-08 MED ORDER — DIPHENHYDRAMINE HCL 12.5 MG/5ML PO ELIX: 12.5000 mg | ORAL_SOLUTION | ORAL | Status: DC | PRN

## 2019-10-08 MED ORDER — ONDANSETRON HCL 4 MG/2ML IJ SOLN
4.0000 mg | Freq: Four times a day (QID) | INTRAMUSCULAR | Status: DC | PRN
  Administered 2019-10-08: 4 mg via INTRAVENOUS
  Filled 2019-10-08: qty 2

## 2019-10-08 MED ORDER — TRANEXAMIC ACID-NACL 1000-0.7 MG/100ML-% IV SOLN
1000.0000 mg | INTRAVENOUS | Status: AC
  Administered 2019-10-08: 1000 mg via INTRAVENOUS
  Filled 2019-10-08: qty 100

## 2019-10-08 MED ORDER — FLUTICASONE PROPIONATE 50 MCG/ACT NA SUSP
2.0000 | Freq: Every day | NASAL | Status: DC
  Administered 2019-10-09: 2 via NASAL
  Filled 2019-10-08: qty 16

## 2019-10-08 MED ORDER — METHOCARBAMOL 500 MG PO TABS: 500.0000 mg | ORAL_TABLET | Freq: Four times a day (QID) | ORAL | 0 refills | Status: DC | PRN

## 2019-10-08 MED ORDER — VALACYCLOVIR HCL 500 MG PO TABS
2000.0000 mg | ORAL_TABLET | Freq: Every day | ORAL | Status: DC | PRN
  Filled 2019-10-08: qty 4

## 2019-10-08 MED ORDER — LACTATED RINGERS IV SOLN
INTRAVENOUS | Status: DC
  Administered 2019-10-08: 13:00:00 1000 mL via INTRAVENOUS

## 2019-10-08 MED ORDER — MEPERIDINE HCL 50 MG/ML IJ SOLN: 6.2500 mg | INTRAMUSCULAR | Status: DC | PRN

## 2019-10-08 MED ORDER — CEFAZOLIN SODIUM-DEXTROSE 2-4 GM/100ML-% IV SOLN
2.0000 g | INTRAVENOUS | Status: AC
  Administered 2019-10-08: 2 g via INTRAVENOUS
  Filled 2019-10-08: qty 100

## 2019-10-08 MED ORDER — SERTRALINE HCL 50 MG PO TABS
150.0000 mg | ORAL_TABLET | Freq: Every day | ORAL | Status: DC
  Administered 2019-10-09: 150 mg via ORAL
  Filled 2019-10-08: qty 3

## 2019-10-08 MED ORDER — MAGNESIUM CITRATE PO SOLN: 1.0000 | Freq: Once | ORAL | Status: DC | PRN

## 2019-10-08 MED ORDER — MEPIVACAINE HCL (PF) 2 % IJ SOLN
INTRAMUSCULAR | Status: DC | PRN
  Administered 2019-10-08: 60 mg via INTRATHECAL

## 2019-10-08 MED ORDER — FENTANYL CITRATE (PF) 100 MCG/2ML IJ SOLN
INTRAMUSCULAR | Status: AC
  Filled 2019-10-08: qty 2

## 2019-10-08 MED ORDER — ONDANSETRON HCL 4 MG PO TABS: 4.0000 mg | ORAL_TABLET | Freq: Four times a day (QID) | ORAL | Status: DC | PRN

## 2019-10-08 MED ORDER — ALUM & MAG HYDROXIDE-SIMETH 200-200-20 MG/5ML PO SUSP: 15.0000 mL | ORAL | Status: DC | PRN

## 2019-10-08 MED ORDER — CEFAZOLIN SODIUM-DEXTROSE 2-4 GM/100ML-% IV SOLN
2.0000 g | Freq: Four times a day (QID) | INTRAVENOUS | Status: AC
  Administered 2019-10-08 (×2): 2 g via INTRAVENOUS
  Filled 2019-10-08 (×2): qty 100

## 2019-10-08 MED ORDER — EPINEPHRINE 0.3 MG/0.3ML IJ SOAJ
0.3000 mg | INTRAMUSCULAR | Status: DC | PRN
  Filled 2019-10-08: qty 0.6

## 2019-10-08 MED ORDER — PHENOL 1.4 % MT LIQD
1.0000 | OROMUCOSAL | Status: DC | PRN
  Filled 2019-10-08: qty 177

## 2019-10-08 MED ORDER — HYDROMORPHONE HCL 1 MG/ML IJ SOLN
INTRAMUSCULAR | Status: AC
  Filled 2019-10-08: qty 1

## 2019-10-08 MED ORDER — PHENYLEPHRINE HCL (PRESSORS) 10 MG/ML IV SOLN
INTRAVENOUS | Status: AC
  Filled 2019-10-08: qty 1

## 2019-10-08 MED ORDER — PROPOFOL 500 MG/50ML IV EMUL
INTRAVENOUS | Status: DC | PRN
  Administered 2019-10-08: 75 ug/kg/min via INTRAVENOUS

## 2019-10-08 MED ORDER — METOCLOPRAMIDE HCL 5 MG/ML IJ SOLN: 5.0000 mg | Freq: Three times a day (TID) | INTRAMUSCULAR | Status: DC | PRN

## 2019-10-08 MED ORDER — LACTATED RINGERS IV BOLUS: 250.0000 mL | Freq: Once | INTRAVENOUS | Status: DC

## 2019-10-08 MED ORDER — MENTHOL 3 MG MT LOZG: 1.0000 | LOZENGE | OROMUCOSAL | Status: DC | PRN

## 2019-10-08 MED ORDER — PROPOFOL 500 MG/50ML IV EMUL
INTRAVENOUS | Status: AC
  Filled 2019-10-08: qty 50

## 2019-10-08 MED ORDER — HYDROCODONE-ACETAMINOPHEN 5-325 MG PO TABS
1.0000 | ORAL_TABLET | ORAL | Status: DC | PRN
  Administered 2019-10-08 (×2): 1 via ORAL
  Administered 2019-10-09 (×2): 2 via ORAL
  Filled 2019-10-08: qty 1
  Filled 2019-10-08 (×2): qty 2

## 2019-10-08 MED ORDER — DOCUSATE SODIUM 100 MG PO CAPS: 100.0000 mg | ORAL_CAPSULE | Freq: Two times a day (BID) | ORAL | 0 refills | Status: DC

## 2019-10-08 MED ORDER — FERROUS SULFATE 325 (65 FE) MG PO TABS
325.0000 mg | ORAL_TABLET | Freq: Three times a day (TID) | ORAL | Status: DC
  Administered 2019-10-09 (×2): 325 mg via ORAL
  Filled 2019-10-08 (×2): qty 1

## 2019-10-08 MED ORDER — OLOPATADINE HCL 0.6 % NA SOLN: 2.0000 | Freq: Every day | NASAL | Status: DC

## 2019-10-08 MED ORDER — HYDROMORPHONE HCL 1 MG/ML IJ SOLN: 0.5000 mg | INTRAMUSCULAR | Status: DC | PRN

## 2019-10-08 MED ORDER — TRANEXAMIC ACID-NACL 1000-0.7 MG/100ML-% IV SOLN
1000.0000 mg | Freq: Once | INTRAVENOUS | Status: AC
  Administered 2019-10-08: 1000 mg via INTRAVENOUS
  Filled 2019-10-08: qty 100

## 2019-10-08 MED ORDER — FENTANYL CITRATE (PF) 100 MCG/2ML IJ SOLN
INTRAMUSCULAR | Status: DC | PRN
  Administered 2019-10-08 (×2): 50 ug via INTRAVENOUS

## 2019-10-08 MED ORDER — ZOLPIDEM TARTRATE 5 MG PO TABS
2.5000 mg | ORAL_TABLET | Freq: Every evening | ORAL | Status: DC | PRN
  Administered 2019-10-08: 2.5 mg via ORAL
  Filled 2019-10-08: qty 1

## 2019-10-08 MED ORDER — HYDROCODONE-ACETAMINOPHEN 7.5-325 MG PO TABS: 1.0000 | ORAL_TABLET | ORAL | 0 refills | Status: DC | PRN

## 2019-10-08 MED ORDER — METOCLOPRAMIDE HCL 5 MG PO TABS: 5.0000 mg | ORAL_TABLET | Freq: Three times a day (TID) | ORAL | Status: DC | PRN

## 2019-10-08 MED ORDER — ALBUTEROL SULFATE (2.5 MG/3ML) 0.083% IN NEBU: 2.5000 mg | INHALATION_SOLUTION | Freq: Four times a day (QID) | RESPIRATORY_TRACT | Status: DC | PRN

## 2019-10-08 MED ORDER — PANTOPRAZOLE SODIUM 40 MG PO TBEC: 40.0000 mg | DELAYED_RELEASE_TABLET | Freq: Every day | ORAL | Status: DC | PRN

## 2019-10-08 MED ORDER — POLYETHYLENE GLYCOL 3350 17 G PO PACK
17.0000 g | PACK | Freq: Two times a day (BID) | ORAL | Status: DC
  Administered 2019-10-09: 09:00:00 17 g via ORAL
  Filled 2019-10-08: qty 1

## 2019-10-08 MED ORDER — DEXAMETHASONE SODIUM PHOSPHATE 10 MG/ML IJ SOLN
10.0000 mg | Freq: Once | INTRAMUSCULAR | Status: AC
  Administered 2019-10-09: 10 mg via INTRAVENOUS
  Filled 2019-10-08: qty 1

## 2019-10-08 MED ORDER — HYDROCODONE-ACETAMINOPHEN 7.5-325 MG PO TABS
1.0000 | ORAL_TABLET | ORAL | Status: DC | PRN
  Administered 2019-10-08: 1 via ORAL
  Filled 2019-10-08: qty 1

## 2019-10-08 MED ORDER — PROPOFOL 1000 MG/100ML IV EMUL
INTRAVENOUS | Status: AC
  Filled 2019-10-08: qty 100

## 2019-10-08 MED ORDER — DOCUSATE SODIUM 100 MG PO CAPS
100.0000 mg | ORAL_CAPSULE | Freq: Two times a day (BID) | ORAL | Status: DC
  Administered 2019-10-08 – 2019-10-09 (×2): 100 mg via ORAL
  Filled 2019-10-08 (×2): qty 1

## 2019-10-08 MED ORDER — METHOCARBAMOL 500 MG IVPB - SIMPLE MED
500.0000 mg | Freq: Four times a day (QID) | INTRAVENOUS | Status: DC | PRN
  Filled 2019-10-08: qty 50

## 2019-10-08 MED ORDER — ASPIRIN 81 MG PO CHEW: 81.0000 mg | CHEWABLE_TABLET | Freq: Two times a day (BID) | ORAL | 0 refills | Status: AC

## 2019-10-08 MED ORDER — LORATADINE 10 MG PO TABS: 10.0000 mg | ORAL_TABLET | Freq: Every day | ORAL | Status: DC | PRN

## 2019-10-08 MED ORDER — LACTATED RINGERS IV BOLUS: 500.0000 mL | Freq: Once | INTRAVENOUS | Status: DC

## 2019-10-08 MED ORDER — METHOCARBAMOL 500 MG IVPB - SIMPLE MED
INTRAVENOUS | Status: AC
  Administered 2019-10-08: 500 mg via INTRAVENOUS
  Filled 2019-10-08: qty 50

## 2019-10-08 MED ORDER — MIDAZOLAM HCL 5 MG/5ML IJ SOLN
INTRAMUSCULAR | Status: DC | PRN
  Administered 2019-10-08: 2 mg via INTRAVENOUS

## 2019-10-08 MED ORDER — PROPOFOL 10 MG/ML IV BOLUS
INTRAVENOUS | Status: DC | PRN
  Administered 2019-10-08: 20 mg via INTRAVENOUS
  Administered 2019-10-08: 40 mg via INTRAVENOUS

## 2019-10-08 MED ORDER — ASPIRIN 81 MG PO CHEW
81.0000 mg | CHEWABLE_TABLET | Freq: Two times a day (BID) | ORAL | Status: DC
  Administered 2019-10-08 – 2019-10-09 (×2): 81 mg via ORAL
  Filled 2019-10-08 (×2): qty 1

## 2019-10-08 MED ORDER — DEXAMETHASONE SODIUM PHOSPHATE 10 MG/ML IJ SOLN
10.0000 mg | Freq: Once | INTRAMUSCULAR | Status: AC
  Administered 2019-10-08: 10 mg via INTRAVENOUS

## 2019-10-08 MED ORDER — MIDAZOLAM HCL 2 MG/2ML IJ SOLN
INTRAMUSCULAR | Status: AC
  Filled 2019-10-08: qty 2

## 2019-10-08 MED ORDER — DEXAMETHASONE SODIUM PHOSPHATE 10 MG/ML IJ SOLN
INTRAMUSCULAR | Status: AC
  Filled 2019-10-08: qty 1

## 2019-10-08 MED ORDER — POLYETHYLENE GLYCOL 3350 17 G PO PACK: 17.0000 g | PACK | Freq: Two times a day (BID) | ORAL | 0 refills | Status: DC

## 2019-10-08 MED ORDER — PHENYLEPHRINE HCL-NACL 10-0.9 MG/250ML-% IV SOLN
INTRAVENOUS | Status: DC | PRN
  Administered 2019-10-08: 35 ug/min via INTRAVENOUS

## 2019-10-08 MED ORDER — TRAMADOL HCL 50 MG PO TABS
50.0000 mg | ORAL_TABLET | Freq: Four times a day (QID) | ORAL | Status: DC
  Administered 2019-10-08 – 2019-10-09 (×3): 50 mg via ORAL
  Filled 2019-10-08 (×3): qty 1

## 2019-10-08 MED ORDER — BISACODYL 10 MG RE SUPP: 10.0000 mg | Freq: Every day | RECTAL | Status: DC | PRN

## 2019-10-08 MED ORDER — SODIUM CHLORIDE 0.9 % IR SOLN
Status: DC | PRN
  Administered 2019-10-08: 1000 mL

## 2019-10-08 MED ORDER — ONDANSETRON HCL 4 MG/2ML IJ SOLN
INTRAMUSCULAR | Status: DC | PRN
  Administered 2019-10-08: 4 mg via INTRAVENOUS

## 2019-10-08 MED ORDER — SODIUM CHLORIDE 0.9 % IV SOLN: INTRAVENOUS | Status: DC

## 2019-10-08 MED ORDER — FERROUS SULFATE 325 (65 FE) MG PO TABS: 325.0000 mg | ORAL_TABLET | Freq: Three times a day (TID) | ORAL | 0 refills | Status: AC

## 2019-10-08 MED ORDER — HYDROMORPHONE HCL 1 MG/ML IJ SOLN
0.2500 mg | INTRAMUSCULAR | Status: DC | PRN
  Administered 2019-10-08: 0.25 mg via INTRAVENOUS
  Administered 2019-10-08: 12:00:00 0.5 mg via INTRAVENOUS
  Administered 2019-10-08: 0.25 mg via INTRAVENOUS

## 2019-10-08 MED ORDER — ONDANSETRON HCL 4 MG/2ML IJ SOLN: 4.0000 mg | Freq: Once | INTRAMUSCULAR | Status: DC | PRN

## 2019-10-08 MED ORDER — HYDROCODONE-ACETAMINOPHEN 5-325 MG PO TABS
ORAL_TABLET | ORAL | Status: AC
  Filled 2019-10-08: qty 1

## 2019-10-08 MED ORDER — METHOCARBAMOL 500 MG PO TABS
500.0000 mg | ORAL_TABLET | Freq: Four times a day (QID) | ORAL | Status: DC | PRN
  Administered 2019-10-08 (×2): 500 mg via ORAL
  Filled 2019-10-08 (×2): qty 1

## 2019-10-08 MED ORDER — ACETAMINOPHEN 325 MG PO TABS: 325.0000 mg | ORAL_TABLET | Freq: Four times a day (QID) | ORAL | Status: DC | PRN

## 2019-10-08 MED ORDER — MONTELUKAST SODIUM 10 MG PO TABS: 10.0000 mg | ORAL_TABLET | Freq: Every day | ORAL | Status: DC | PRN

## 2019-10-08 MED ORDER — ONDANSETRON HCL 4 MG/2ML IJ SOLN
INTRAMUSCULAR | Status: AC
  Filled 2019-10-08: qty 2

## 2019-10-08 SURGICAL SUPPLY — 49 items
ADH SKN CLS APL DERMABOND .7 (GAUZE/BANDAGES/DRESSINGS) ×1
BAG DECANTER FOR FLEXI CONT (MISCELLANEOUS) IMPLANT
BAG SPEC THK2 15X12 ZIP CLS (MISCELLANEOUS) ×1
BAG ZIPLOCK 12X15 (MISCELLANEOUS) ×1 IMPLANT
BLADE SAG 18X100X1.27 (BLADE) ×2 IMPLANT
BLADE SURG SZ10 CARB STEEL (BLADE) ×4 IMPLANT
COVER PERINEAL POST (MISCELLANEOUS) ×2 IMPLANT
COVER SURGICAL LIGHT HANDLE (MISCELLANEOUS) ×2 IMPLANT
COVER WAND RF STERILE (DRAPES) IMPLANT
CUP ACETBLR 54 OD PINNACLE (Hips) ×1 IMPLANT
DERMABOND ADVANCED (GAUZE/BANDAGES/DRESSINGS) ×1
DERMABOND ADVANCED .7 DNX12 (GAUZE/BANDAGES/DRESSINGS) ×1 IMPLANT
DRAPE STERI IOBAN 125X83 (DRAPES) ×2 IMPLANT
DRAPE U-SHAPE 47X51 STRL (DRAPES) ×4 IMPLANT
DRESSING AQUACEL AG SP 3.5X10 (GAUZE/BANDAGES/DRESSINGS) ×1 IMPLANT
DRSG AQUACEL AG SP 3.5X10 (GAUZE/BANDAGES/DRESSINGS) ×2
DURAPREP 26ML APPLICATOR (WOUND CARE) ×2 IMPLANT
ELECT BLADE TIP CTD 4 INCH (ELECTRODE) ×2 IMPLANT
ELECT REM PT RETURN 15FT ADLT (MISCELLANEOUS) ×2 IMPLANT
ELIMINATOR HOLE APEX DEPUY (Hips) ×1 IMPLANT
GLOVE BIO SURGEON STRL SZ 6 (GLOVE) ×4 IMPLANT
GLOVE BIOGEL PI IND STRL 6.5 (GLOVE) ×1 IMPLANT
GLOVE BIOGEL PI IND STRL 7.5 (GLOVE) ×1 IMPLANT
GLOVE BIOGEL PI IND STRL 8.5 (GLOVE) ×1 IMPLANT
GLOVE BIOGEL PI INDICATOR 6.5 (GLOVE) ×1
GLOVE BIOGEL PI INDICATOR 7.5 (GLOVE) ×1
GLOVE BIOGEL PI INDICATOR 8.5 (GLOVE) ×1
GLOVE ECLIPSE 8.0 STRL XLNG CF (GLOVE) ×4 IMPLANT
GLOVE ORTHO TXT STRL SZ7.5 (GLOVE) ×4 IMPLANT
GOWN STRL REUS W/TWL LRG LVL3 (GOWN DISPOSABLE) ×4 IMPLANT
GOWN STRL REUS W/TWL XL LVL3 (GOWN DISPOSABLE) ×2 IMPLANT
HEAD CERAMIC DELTA 36 PLUS 1.5 (Hips) ×1 IMPLANT
HOLDER FOLEY CATH W/STRAP (MISCELLANEOUS) ×2 IMPLANT
KIT TURNOVER KIT A (KITS) IMPLANT
LINER NEUTRAL 54X36MM PLUS 4 (Hips) ×1 IMPLANT
PACK ANTERIOR HIP CUSTOM (KITS) ×2 IMPLANT
PENCIL SMOKE EVACUATOR (MISCELLANEOUS) ×1 IMPLANT
SCREW 6.5MMX25MM (Screw) ×1 IMPLANT
STEM FEM ACTIS STD SZ4 (Stem) ×1 IMPLANT
SUT MNCRL AB 4-0 PS2 18 (SUTURE) ×2 IMPLANT
SUT STRATAFIX 0 PDS 27 VIOLET (SUTURE) ×2
SUT VIC AB 1 CT1 36 (SUTURE) ×6 IMPLANT
SUT VIC AB 2-0 CT1 27 (SUTURE) ×4
SUT VIC AB 2-0 CT1 TAPERPNT 27 (SUTURE) ×2 IMPLANT
SUTURE STRATFX 0 PDS 27 VIOLET (SUTURE) ×1 IMPLANT
TRAY FOLEY MTR SLVR 14FR STAT (SET/KITS/TRAYS/PACK) ×1 IMPLANT
TRAY FOLEY MTR SLVR 16FR STAT (SET/KITS/TRAYS/PACK) IMPLANT
WATER STERILE IRR 1000ML POUR (IV SOLUTION) ×2 IMPLANT
YANKAUER SUCT BULB TIP 10FT TU (MISCELLANEOUS) IMPLANT

## 2019-10-08 NOTE — Anesthesia Postprocedure Evaluation (Signed)
Anesthesia Post Note  Patient: MIRINDA WELCHER  Procedure(s) Performed: TOTAL HIP ARTHROPLASTY ANTERIOR APPROACH (Left Hip)     Patient location during evaluation: PACU Anesthesia Type: Spinal Level of consciousness: oriented and awake and alert Pain management: pain level controlled Vital Signs Assessment: post-procedure vital signs reviewed and stable Respiratory status: spontaneous breathing, respiratory function stable and patient connected to nasal cannula oxygen Cardiovascular status: blood pressure returned to baseline and stable Postop Assessment: no headache, no backache and no apparent nausea or vomiting Anesthetic complications: no    Last Vitals:  Vitals:   10/08/19 1100 10/08/19 1115  BP: 111/82 102/82  Pulse: 73 75  Resp: 13 17  Temp:    SpO2: 100% 97%    Last Pain:  Vitals:   10/08/19 1130  TempSrc:   PainSc: 7                  Crucita Lacorte DAVID

## 2019-10-08 NOTE — Discharge Instructions (Signed)

## 2019-10-08 NOTE — Evaluation (Signed)
Physical Therapy Evaluation Patient Details Name: Traci Hudson MRN: [id number] DOB: 11/24/1955 Today's Date: 10/08/2019   History of Present Illness  L DA-THA  Clinical Impression  Pt is s/p THA resulting in the deficits listed below (see PT Problem List). Mod assist for bed mobility and to transfer to recliner. Pt nauseous in standing, did not attempt ambulation. Initiated THA HEP. Good progress expected once nausea resolves. Pt will benefit from skilled PT to increase their independence and safety with mobility to allow discharge to the venue listed below.      Follow Up Recommendations Follow surgeon's recommendation for DC plan and follow-up therapies    Equipment Recommendations  Rolling walker with 5" wheels;3in1 (PT)    Recommendations for Other Services       Precautions / Restrictions Precautions Precautions: Fall Restrictions Weight Bearing Restrictions: No      Mobility  Bed Mobility Overal bed mobility: Needs Assistance Bed Mobility: Supine to Sit     Supine to sit: Mod assist;HOB elevated     General bed mobility comments: assist to raise trunk and advance LLE  Transfers Overall transfer level: Needs assistance Equipment used: Rolling walker (2 wheeled) Transfers: Sit to/from [employer] Sit to Stand: From elevated surface;Mod assist Stand pivot transfers: Min assist       General transfer comment: assist to rise/steady, VCs hand placement, increased time  Ambulation/Gait             General Gait Details: NT- pt nauseous in standing  Stairs            Wheelchair Mobility    Modified Rankin (Stroke Patients Only)       Balance Overall balance assessment: Needs assistance   Sitting balance-Leahy Scale: Good     Standing balance support: Bilateral upper extremity supported Standing balance-Leahy Scale: Poor Standing balance comment: relies on BUE support                             Pertinent  Vitals/Pain Pain Assessment: Faces Faces Pain Scale: Hurts even more Pain Location: L hip Pain Descriptors / Indicators: Sore;Grimacing Pain Intervention(s): Limited activity within patient's tolerance;Monitored during session;Premedicated before session;Ice applied;Repositioned    Home Living Family/patient expects to be discharged to:: Private residence Living Arrangements: Alone Available Help at Discharge: Friend(s);Available 24 hours/day Type of Home: House Home Access: Stairs to enter   [employer] of Steps: 3 Home Layout: Two level;Able to live on main level with bedroom/bathroom Home Equipment: None      Prior Function Level of Independence: Independent               Hand Dominance        Extremity/Trunk Assessment   Upper Extremity Assessment Upper Extremity Assessment: Overall WFL for tasks assessed    Lower Extremity Assessment Lower Extremity Assessment: LLE deficits/detail LLE Deficits / Details: hip flexion AAROM 30* limited by pain, knee ext -3/5 LLE: Unable to fully assess due to pain LLE Sensation: WNL LLE Coordination: WNL    Cervical / Trunk Assessment Cervical / Trunk Assessment: Normal  Communication   Communication: No difficulties  Cognition Arousal/Alertness: Awake/alert Behavior During Therapy: WFL for tasks assessed/performed Overall Cognitive Status: Within Functional Limits for tasks assessed  General Comments      Exercises Total Joint Exercises Ankle Circles/Pumps: AROM;Both;10 reps;Supine Heel Slides: AAROM;Left;10 reps;Supine Hip ABduction/ADduction: AAROM;Left;10 reps;Supine Long Arc Quad: AROM;Left;5 reps;Seated   Assessment/Plan    PT Assessment Patient needs continued PT services  PT Problem List Decreased strength;Decreased range of motion;Decreased activity tolerance;Decreased balance;Decreased mobility;Pain;Decreased knowledge of use of DME        PT Treatment Interventions DME instruction;Gait training;Stair training;Functional mobility training;Therapeutic exercise;Therapeutic activities;Patient/family education    PT Goals (Current goals can be found in the Care Plan section)  Acute Rehab PT Goals Patient Stated Goal: to be able to go up a flight of stairs PT Goal Formulation: With patient Time For Goal Achievement: 10/22/19 Potential to Achieve Goals: Good    Frequency 7X/week   Barriers to discharge        Co-evaluation               AM-PAC PT "6 Clicks" Mobility  Outcome Measure Help needed turning from your back to your side while in a flat bed without using bedrails?: A Little Help needed moving from lying on your back to sitting on the side of a flat bed without using bedrails?: A Lot Help needed moving to and from a bed to a chair (including a wheelchair)?: A Lot Help needed standing up from a chair using your arms (e.g., wheelchair or bedside chair)?: A Lot Help needed to walk in hospital room?: A Lot Help needed climbing 3-5 steps with a railing? : A Lot 6 Click Score: 13    End of Session Equipment Utilized During Treatment: Gait belt Activity Tolerance: Treatment limited secondary to medical complications (Comment)(nausea) Patient left: in chair;with call bell/phone within reach Nurse Communication: Mobility status PT Visit Diagnosis: Difficulty in walking, not elsewhere classified (R26.2);Muscle weakness (generalized) (M62.81);Pain Pain - Right/Left: Left Pain - part of body: Hip    Time: 1341-1416 PT Time Calculation (min) (ACUTE ONLY): 35 min   Charges:   PT Evaluation $PT Eval Low Complexity: 1 Low PT Treatments $Therapeutic Activity: 8-22 mins       Blondell Reveal Kistler PT 10/08/2019  Acute Rehabilitation Services Pager [phone] Office [phone]

## 2019-10-08 NOTE — Plan of Care (Signed)
Plan of care 

## 2019-10-08 NOTE — Interval H&P Note (Signed)
History and Physical Interval Note:  10/08/2019 7:09 AM  Traci Hudson  has presented today for surgery, with the diagnosis of Left hip osteoarthritis.  The various methods of treatment have been discussed with the patient and family. After consideration of risks, benefits and other options for treatment, the patient has consented to  Procedure(s) with comments: TOTAL HIP ARTHROPLASTY ANTERIOR APPROACH (Left) - 70 mins as a surgical intervention.  The patient's history has been reviewed, patient examined, no change in status, stable for surgery.  I have reviewed the patient's chart and labs.  Questions were answered to the patient's satisfaction.     [name]

## 2019-10-08 NOTE — Anesthesia Procedure Notes (Signed)
Procedure Name: MAC Date/Time: 10/08/2019 8:33 AM Performed by: Maxwell Caul, CRNA Pre-anesthesia Checklist: Patient identified, Emergency Drugs available, Suction available and Patient being monitored Oxygen Delivery Method: Simple face mask

## 2019-10-08 NOTE — Anesthesia Preprocedure Evaluation (Signed)
Anesthesia Evaluation  Patient identified by MRN, date of birth, ID band Patient awake    Reviewed: Allergy & Precautions, NPO status , Patient's Chart, lab work & pertinent test results  Airway Mallampati: I  TM Distance: >3 FB Neck ROM: Full    Dental   Pulmonary    Pulmonary exam normal        Cardiovascular hypertension, Pt. on medications Normal cardiovascular exam     Neuro/Psych    GI/Hepatic   Endo/Other    Renal/GU      Musculoskeletal   Abdominal   Peds  Hematology   Anesthesia Other Findings   Reproductive/Obstetrics                             Anesthesia Physical Anesthesia Plan  ASA: II  Anesthesia Plan: Spinal   Post-op Pain Management:    Induction: Intravenous  PONV Risk Score and Plan: 2 and Ondansetron and Midazolam  Airway Management Planned: Nasal Cannula  Additional Equipment:   Intra-op Plan:   Post-operative Plan:   Informed Consent: I have reviewed the patients History and Physical, chart, labs and discussed the procedure including the risks, benefits and alternatives for the proposed anesthesia with the patient or authorized representative who has indicated his/her understanding and acceptance.       Plan Discussed with: CRNA and Surgeon  Anesthesia Plan Comments:         Anesthesia Quick Evaluation

## 2019-10-08 NOTE — Op Note (Signed)
NAME:  Traci Hudson.: [id number]      MEDICAL RECORD NO.: [id number]      FACILITY:  [facility]      PHYSICIAN:  [name]  DATE OF BIRTH:  [birth year]     DATE OF PROCEDURE:  10/08/2019                                 OPERATIVE REPORT         PREOPERATIVE DIAGNOSIS: Left  hip osteoarthritis.      POSTOPERATIVE DIAGNOSIS:  Left hip osteoarthritis.      PROCEDURE:  Left total hip replacement through an anterior approach   utilizing DePuy THR system, component size 54 pinnacle cup, a size 36+4 neutral   Altrex liner, a size 4 standard Actis stem with a 36+1.5 delta ceramic   ball.      SURGEON:  Pietro Cassis. Alvan Dame, M.D.      ASSISTANT:  Danae Orleans, PA-C     ANESTHESIA:  Spinal.      SPECIMENS:  None.      COMPLICATIONS:  None.      BLOOD LOSS:  400 cc     DRAINS:  None.      INDICATION OF THE PROCEDURE:  Traci Hudson is a 64 y.o. female who had   presented to office for evaluation of left hip pain.  Radiographs revealed   progressive degenerative changes with bone-on-bone   articulation of the  hip joint, including subchondral cystic changes and osteophytes.  The patient had painful limited range of   motion significantly affecting their overall quality of life and function.  The patient was failing to    respond to conservative measures including medications and/or injections and activity modification and at this point was ready   to proceed with more definitive measures.  Consent was obtained for   benefit of pain relief.  Specific risks of infection, DVT, component   failure, dislocation, neurovascular injury, and need for revision surgery were reviewed in the office as well discussion of   the anterior versus posterior approach were reviewed.     PROCEDURE IN DETAIL:  The patient was brought to operative theater.   Once adequate anesthesia, preoperative antibiotics, 2 gm of Ancef, 1 gm of Tranexamic Acid, and  10 mg of Decadron were administered, the patient was positioned supine on the [employer] table.  Once the patient was safely positioned with adequate padding of boney prominences we predraped out the hip, and used fluoroscopy to confirm orientation of the pelvis.      The left hip was then prepped and draped from proximal iliac crest to   mid thigh with a shower curtain technique.      Time-out was performed identifying the patient, planned procedure, and the appropriate extremity.     An incision was then made 2 cm lateral to the   anterior superior iliac spine extending over the orientation of the   tensor fascia lata muscle and sharp dissection was carried down to the   fascia of the muscle.      The fascia was then incised.  The muscle belly was identified and swept   laterally and retractor placed along the superior neck.  Following   cauterization of the circumflex vessels and removing some pericapsular  fat, a second cobra retractor was placed on the inferior neck.  A T-capsulotomy was made along the line of the   superior neck to the trochanteric fossa, then extended proximally and   distally.  Tag sutures were placed and the retractors were then placed   intracapsular.  We then identified the trochanteric fossa and   orientation of my neck cut and then made a neck osteotomy with the femur on traction.  The femoral   head was removed without difficulty or complication.  Traction was let   off and retractors were placed posterior and anterior around the   acetabulum.      The labrum and foveal tissue were debrided.  I began reaming with a 46 mm   reamer and reamed up to 53 mm reamer with good bony bed preparation and a 54 mm  cup was chosen.  The final 54 mm Pinnacle cup was then impacted under fluoroscopy to confirm the depth of penetration and orientation with respect to   Abduction and forward flexion.  A screw was placed into the ilium followed by the hole eliminator.  The final    36+4 neutral Altrex liner was impacted with good visualized rim fit.  The cup was positioned anatomically within the acetabular portion of the pelvis.      At this point, the femur was rolled to 100 degrees.  Further capsule was   released off the inferior aspect of the femoral neck.  I then   released the superior capsule proximally.  With the leg in a neutral position the hook was placed laterally   along the femur under the vastus lateralis origin and elevated manually and then held in position using the hook attachment on the bed.  The leg was then extended and adducted with the leg rolled to 100   degrees of external rotation.  Retractors were placed along the medial calcar and posteriorly over the greater trochanter.  Once the proximal femur was fully   exposed, I used a box osteotome to set orientation.  I then began   broaching with the starting chili pepper broach and passed this by hand and then broached up to 4.  With the 4 broach in place I chose a standard offset neck and did several trial reductions.  The offset was appropriate, leg lengths   appeared to be equal best matched with the +1.5 head ball trial confirmed radiographically.   Given these findings, I went ahead and dislocated the hip, repositioned all   retractors and positioned the right hip in the extended and abducted position.  The final 4 standard Actis stem was   chosen and it was impacted down to the level of neck cut.  Based on this   and the trial reductions, a final 36+1.5 delta ceramic ball was chosen and   impacted onto a clean and dry trunnion, and the hip was reduced.  The   hip had been irrigated throughout the case again at this point.  I did   reapproximate the superior capsular leaflet to the anterior leaflet   using #1 Vicryl.  The fascia of the   tensor fascia lata muscle was then reapproximated using #1 Vicryl and #0 Stratafix sutures.  The   remaining wound was closed with 2-0 Vicryl and running 4-0  Monocryl.   The hip was cleaned, dried, and dressed sterilely using Dermabond and   Aquacel dressing.  The patient was then brought   to recovery room in  stable condition tolerating the procedure well.    Danae Orleans, PA-C was present for the entirety of the case involved from   preoperative positioning, perioperative retractor management, general   facilitation of the case, as well as primary wound closure as assistant.            Pietro Cassis Alvan Dame, M.D.        10/08/2019 10:00 AM

## 2019-10-08 NOTE — Anesthesia Procedure Notes (Signed)
Spinal  Patient location during procedure: OR Start time: 10/08/2019 8:38 AM End time: 10/08/2019 8:40 AM Staffing Performed: anesthesiologist  Anesthesiologist: Lillia Abed, MD Preanesthetic Checklist Completed: patient identified, IV checked, risks and benefits discussed, surgical consent, monitors and equipment checked, pre-op evaluation and timeout performed Spinal Block Patient position: sitting Prep: DuraPrep Patient monitoring: heart rate, cardiac monitor, continuous pulse ox and blood pressure Approach: right paramedian Location: L3-4 Injection technique: single-shot Needle Needle type: Pencan  Needle gauge: 24 G Needle length: 9 cm Needle insertion depth: 5 cm

## 2019-10-08 NOTE — Transfer of Care (Signed)
Immediate Anesthesia Transfer of Care Note  Patient: Traci Hudson  Procedure(s) Performed: TOTAL HIP ARTHROPLASTY ANTERIOR APPROACH (Left Hip)  Patient Location: PACU  Anesthesia Type:Spinal  Level of Consciousness: awake, alert  and oriented  Airway & Oxygen Therapy: Patient Spontanous Breathing and Patient connected to face mask oxygen  Post-op Assessment: Report given to RN and Post -op Vital signs reviewed and stable  Post vital signs: Reviewed and stable  Last Vitals:  Vitals Value Taken Time  BP    Temp    Pulse 89 10/08/19 1029  Resp 12 10/08/19 1029  SpO2 100 % 10/08/19 1029  Vitals shown include unvalidated device data.  Last Pain:  Vitals:   10/08/19 0610  TempSrc: Oral         Complications: No apparent anesthesia complications

## 2019-10-09 ENCOUNTER — Encounter: Payer: Self-pay | Admitting: *Deleted

## 2019-10-09 DIAGNOSIS — M1612 Unilateral primary osteoarthritis, left hip: Secondary | ICD-10-CM | POA: Diagnosis not present

## 2019-10-09 DIAGNOSIS — E663 Overweight: Secondary | ICD-10-CM | POA: Diagnosis present

## 2019-10-09 LAB — BASIC METABOLIC PANEL
Anion gap: 6 (ref 5–15)
BUN: 8 mg/dL (ref 8–23)
CO2: 26 mmol/L (ref 22–32)
Calcium: 8.4 mg/dL — ABNORMAL LOW (ref 8.9–10.3)
Chloride: 99 mmol/L (ref 98–111)
Creatinine, Ser: 0.79 mg/dL (ref 0.44–1.00)
GFR calc Af Amer: >60 mL/min (ref >60)
GFR calc non Af Amer: >60 mL/min (ref >60)
Glucose, Bld: 141 mg/dL — ABNORMAL HIGH (ref 70–99)
Potassium: 4 mmol/L (ref 3.5–5.1)
Sodium: 131 mmol/L — ABNORMAL LOW (ref 135–145)

## 2019-10-09 LAB — CBC
HCT: 32.9 % — ABNORMAL LOW (ref 36.0–46.0)
Hemoglobin: 10.9 g/dL — ABNORMAL LOW (ref 12.0–15.0)
MCH: 27.7 pg (ref 26.0–34.0)
MCHC: 33.1 g/dL (ref 30.0–36.0)
MCV: 83.5 fL (ref 80.0–100.0)
Platelets: 205 10*3/uL (ref 150–400)
RBC: 3.94 MIL/uL (ref 3.87–5.11)
RDW: 12.8 % (ref 11.5–15.5)
WBC: 11.6 10*3/uL — ABNORMAL HIGH (ref 4.0–10.5)
nRBC: 0 % (ref 0.0–0.2)

## 2019-10-09 NOTE — Care Management Obs Status (Signed)
[facility] NOTIFICATION   Patient Details  Name: Traci Hudson MRN: [id number] Date of Birth: 08/30/1955   Medicare Observation Status Notification Given:  Yes    Leeroy Cha, RN 10/09/2019, 2:11 PM

## 2019-10-09 NOTE — Progress Notes (Signed)
     Subjective: 1 Day Post-Op Procedure(s) (LRB): TOTAL HIP ARTHROPLASTY ANTERIOR APPROACH (Left)   Patient reports pain as mild, pain controlled medication.  States the pain was intense at times last night, but better this morning.  No other reported events throughout the night.  Discussed the procedure, findings and expectations moving forward.  Answered the patient and caregivers questions.  Patient will be ready to be discharged home today, she does well therapy.   Objective:   VITALS:   Vitals:   10/09/19 0134 10/09/19 0555  BP: 130/80 123/80  Pulse: 78 86  Resp: 18 18  Temp: 99.3 F (37.4 C) 98.6 F (37 C)  SpO2: 98% 95%    Dorsiflexion/Plantar flexion intact Incision: dressing C/D/I No cellulitis present Compartment soft  LABS Recent Labs    10/09/19 0417  HGB 10.9*  HCT 32.9*  WBC 11.6*  PLT 205    Recent Labs    10/09/19 0417  NA 131*  K 4.0  BUN 8  CREATININE 0.79  GLUCOSE 141*     Assessment/Plan: 1 Day Post-Op Procedure(s) (LRB): TOTAL HIP ARTHROPLASTY ANTERIOR APPROACH (Left) Foley cath d/c'ed Advance diet Up with therapy D/C IV fluids Discharge home Follow up in 2 weeks at [facility] Follow up with OLIN,Nicoya Friel D in 2 weeks.  Contact information:  [facility] [address] [id number]    Overweight (BMI 25-29.9) Estimated body mass index is 26.94 kg/m as calculated from the following:   Height as of this encounter: 5\' 4"  (1.626 m).   Weight as of this encounter: 71.2 kg. Patient also counseled that weight may inhibit the healing process Patient counseled that losing weight will help with future health issues       Danae Orleans PA-C  [facility]  [place] [address], [place], [place] [address] Phone: [phone] [email or web address] Facebook  [employer]

## 2019-10-09 NOTE — Progress Notes (Signed)
Physical Therapy Treatment Patient Details Name: Traci Hudson MRN: [id number] DOB: [birth year] Today's Date: 10/09/2019    History of Present Illness L DA-THA    PT Comments    Pt ambulated 28' with RW, no loss of balance, distance limited by pain/fatigue. Instructed pt and CG in THA HEP. Will plan to do stair training this afternoon, then expect pt will be ready to DC home.   Follow Up Recommendations  Follow surgeon's recommendation for DC plan and follow-up therapies     Equipment Recommendations  Rolling walker with 5" wheels;3in1 (PT)    Recommendations for Other Services       Precautions / Restrictions Precautions Precautions: Fall Restrictions Weight Bearing Restrictions: No    Mobility  Bed Mobility Overal bed mobility: Needs Assistance Bed Mobility: Supine to Sit     Supine to sit: HOB elevated;Min assist     General bed mobility comments: assist to raise trunk and advance LLE  Transfers Overall transfer level: Needs assistance Equipment used: Rolling walker (2 wheeled) Transfers: Sit to/from Stand Sit to Stand: From elevated surface;Min guard         General transfer comment: VCs hand placement, increased time  Ambulation/Gait Ambulation/Gait assistance: Min guard Gait Distance (Feet): 60 Feet Assistive device: Rolling walker (2 wheeled) Gait Pattern/deviations: Step-to pattern;Decreased stride length Gait velocity: decr   General Gait Details: VCs sequencing, no loss of balance, distance limited by fatigue/pain   Stairs             Wheelchair Mobility    Modified Rankin (Stroke Patients Only)       Balance Overall balance assessment: Needs assistance   Sitting balance-Leahy Scale: Good     Standing balance support: Bilateral upper extremity supported Standing balance-Leahy Scale: Poor Standing balance comment: relies on BUE support                            Cognition Arousal/Alertness:  Awake/alert Behavior During Therapy: WFL for tasks assessed/performed Overall Cognitive Status: Within Functional Limits for tasks assessed                                        Exercises Total Joint Exercises Ankle Circles/Pumps: AROM;Both;10 reps;Supine Quad Sets: AROM;Left;5 reps;Supine Short Arc Quad: AROM;Left;10 reps;Supine Heel Slides: AAROM;Left;10 reps;Supine Hip ABduction/ADduction: AAROM;Left;10 reps;Supine Long Arc Quad: AROM;Left;5 reps;Seated    General Comments        Pertinent Vitals/Pain Pain Score: 5  Pain Location: L hip Pain Descriptors / Indicators: Sore;Grimacing Pain Intervention(s): Limited activity within patient's tolerance;Monitored during session;Premedicated before session;Ice applied;Repositioned    Home Living                      Prior Function            PT Goals (current goals can now be found in the care plan section) Acute Rehab PT Goals Patient Stated Goal: to be able to go up a flight of stairs PT Goal Formulation: With patient Time For Goal Achievement: 10/22/19 Potential to Achieve Goals: Good Progress towards PT goals: Progressing toward goals    Frequency    7X/week      PT Plan Current plan remains appropriate    Co-evaluation              AM-PAC PT "6 Clicks" Mobility   Outcome  Measure  Help needed turning from your back to your side while in a flat bed without using bedrails?: A Little Help needed moving from lying on your back to sitting on the side of a flat bed without using bedrails?: A Little Help needed moving to and from a bed to a chair (including a wheelchair)?: A Little Help needed standing up from a chair using your arms (e.g., wheelchair or bedside chair)?: A Little Help needed to walk in hospital room?: A Little Help needed climbing 3-5 steps with a railing? : A Little 6 Click Score: 18    End of Session Equipment Utilized During Treatment: Gait belt Activity  Tolerance: Patient tolerated treatment well Patient left: in chair;with call bell/phone within reach;with family/visitor present Nurse Communication: Mobility status PT Visit Diagnosis: Difficulty in walking, not elsewhere classified (R26.2);Muscle weakness (generalized) (M62.81);Pain Pain - Right/Left: Left Pain - part of body: Hip     Time: [id number] PT Time Calculation (min) (ACUTE ONLY): 52 min  Charges:  $Gait Training: 8-22 mins $Therapeutic Exercise: 8-22 mins $Therapeutic Activity: 8-22 mins            Blondell Reveal Kistler PT 10/09/2019  Acute Rehabilitation Services Pager [phone] Office [phone]

## 2019-10-09 NOTE — Progress Notes (Signed)
Physical Therapy Treatment Patient Details Name: Traci Hudson MRN: [id number] DOB: [birth year] Today's Date: 10/09/2019    History of Present Illness L DA-THA    PT Comments    Stair training completed, pt is ready to DC home from PT standpoint.   Follow Up Recommendations  Follow surgeon's recommendation for DC plan and follow-up therapies     Equipment Recommendations  Rolling walker with 5" wheels;3in1 (PT)    Recommendations for Other Services       Precautions / Restrictions Precautions Precautions: Fall Restrictions Weight Bearing Restrictions: No    Mobility  Bed Mobility Overal bed mobility: Needs Assistance Bed Mobility: Supine to Sit     Supine to sit: HOB elevated;Min guard     General bed mobility comments: VCs to self assist LLE with RLE  Transfers Overall transfer level: Needs assistance Equipment used: Rolling walker (2 wheeled) Transfers: Sit to/from Stand Sit to Stand: From elevated surface;Supervision         General transfer comment: VCs hand placement  Ambulation/Gait Ambulation/Gait assistance: Min guard Gait Distance (Feet): 60 Feet Assistive device: Rolling walker (2 wheeled) Gait Pattern/deviations: Step-to pattern;Decreased stride length Gait velocity: decr   General Gait Details: VCs sequencing, no loss of balance, distance limited by fatigue/pain   Stairs Stairs: Yes Stairs assistance: Min assist Stair Management: No rails;Backwards;Step to pattern Number of Stairs: 3 General stair comments: VCs sequencing, min A to manage/steady RW   Wheelchair Mobility    Modified Rankin (Stroke Patients Only)       Balance Overall balance assessment: Needs assistance   Sitting balance-Leahy Scale: Good     Standing balance support: Bilateral upper extremity supported Standing balance-Leahy Scale: Poor Standing balance comment: relies on BUE support                            Cognition Arousal/Alertness:  Awake/alert Behavior During Therapy: WFL for tasks assessed/performed Overall Cognitive Status: Within Functional Limits for tasks assessed                                           General Comments        Pertinent Vitals/Pain Pain Score: 4  Pain Location: L hip Pain Descriptors / Indicators: Sore Pain Intervention(s): Limited activity within patient's tolerance;Monitored during session;Premedicated before session;Ice applied    Home Living                      Prior Function            PT Goals (current goals can now be found in the care plan section) Acute Rehab PT Goals Patient Stated Goal: to be able to go up a flight of stairs PT Goal Formulation: With patient Time For Goal Achievement: 10/22/19 Potential to Achieve Goals: Good Progress towards PT goals: Progressing toward goals    Frequency    7X/week      PT Plan Current plan remains appropriate    Co-evaluation              AM-PAC PT "6 Clicks" Mobility   Outcome Measure  Help needed turning from your back to your side while in a flat bed without using bedrails?: A Little Help needed moving from lying on your back to sitting on the side of a flat bed without using bedrails?: A  Little Help needed moving to and from a bed to a chair (including a wheelchair)?: A Little Help needed standing up from a chair using your arms (e.g., wheelchair or bedside chair)?: A Little Help needed to walk in hospital room?: A Little Help needed climbing 3-5 steps with a railing? : A Little 6 Click Score: 18    End of Session Equipment Utilized During Treatment: Gait belt Activity Tolerance: Patient tolerated treatment well Patient left: in bed;with bed alarm set Nurse Communication: Mobility status PT Visit Diagnosis: Difficulty in walking, not elsewhere classified (R26.2);Muscle weakness (generalized) (M62.81);Pain Pain - Right/Left: Left Pain - part of body: Hip     Time:  1247-1310 PT Time Calculation (min) (ACUTE ONLY): 23 min  Charges:  $Gait Training: 8-22 mins  $Therapeutic Activity: 8-22 mins                     Blondell Reveal Kistler PT 10/09/2019  Acute Rehabilitation Services Pager [phone] Office [phone]

## 2019-10-15 NOTE — Discharge Summary (Signed)
Physician Discharge Summary  Patient ID: Traci Hudson MRN: [id number] DOB/AGE: 65-Nov-1957 65 y.o.  Admit date: 10/08/2019 Discharge date: 10/09/2019   Procedures:  Procedure(s) (LRB): TOTAL HIP ARTHROPLASTY ANTERIOR APPROACH (Left)  Attending Physician:  Dr. Paralee Cancel   Admission Diagnoses:   Left hip primary OA / pain  Discharge Diagnoses:  Principal Problem:   S/P left THA, AA Active Problems:   Overweight (BMI 25.0-29.9)  Past Medical History:  Diagnosis Date  . Asthma   . Diverticulitis   . Hypercholesteremia   . Hypertension   . Insomnia   . Pre-diabetes   . Skin cancer     HPI:    Traci Hudson, 64 y.o. female, has a history of pain and functional disability in the left hip(s) due to arthritis and patient has failed non-surgical conservative treatments for greater than 12 weeks to include NSAID's and/or analgesics, corticosteriod injections and activity modification.  Onset of symptoms was gradual starting 2-3 years ago with gradually worsening course since that time.The patient noted no past surgery on the left hip(s).  Patient currently rates pain in the left hip at 9 out of 10 with activity. Patient has night pain, worsening of pain with activity and weight bearing, trendelenberg gait, pain that interfers with activities of daily living and pain with passive range of motion. Patient has evidence of periarticular osteophytes and joint space narrowing by imaging studies. This condition presents safety issues increasing the risk of falls. There is no current active infection.  Risks, benefits and expectations were discussed with the patient.  Risks including but not limited to the risk of anesthesia, blood clots, nerve damage, blood vessel damage, failure of the prosthesis, infection and up to and including death.  Patient understand the risks, benefits and expectations and wishes to proceed with surgery.   PCP: London Pepper, MD   Discharged Condition: good   Hospital Course:  Patient underwent the above stated procedure on 10/08/2019. Patient tolerated the procedure well and brought to the recovery room in good condition and subsequently to the floor.  POD #1 BP: 123/80 ; Pulse: 86 ; Temp: 98.6 F (37 C) ; Resp: 18 Patient reports pain as mild, pain controlled medication.  States the pain was intense at times last night, but better this morning.  No other reported events throughout the night.  Discussed the procedure, findings and expectations moving forward.  Answered the patient and caregivers questions.  Patient will be ready to be discharged home. Dorsiflexion/plantar flexion intact, incision: dressing C/D/I, no cellulitis present and compartment soft.   LABS  Basename    HGB     10.9  HCT     32.9    Discharge Exam: General appearance: alert, cooperative and no distress Extremities: Homans sign is negative, no sign of DVT, no edema, redness or tenderness in the calves or thighs and no ulcers, gangrene or trophic changes  Disposition: Home with follow up in 2 weeks   Follow-up Information    Paralee Cancel, MD. Schedule an appointment as soon as possible for a visit in 2 weeks.   Specialty: Orthopedic Surgery Contact information: [address] [place] [address] [id number]           Discharge Instructions    Call MD / Call 911   Complete by: As directed    If you experience chest pain or shortness of breath, CALL 911 and be transported to the hospital emergency room.  If you develope a fever  above 101 F, pus (white drainage) or increased drainage or redness at the wound, or calf pain, call your surgeon's office.   Change dressing   Complete by: As directed    Maintain surgical dressing until follow up in the clinic. If the edges start to pull up, may reinforce with tape. If the dressing is no longer working, may remove and cover with gauze and tape, but must keep the area dry and clean.  Call with any  questions or concerns.   Constipation Prevention   Complete by: As directed    Drink plenty of fluids.  Prune juice may be helpful.  You may use a stool softener, such as Colace (over the counter) 100 mg twice a day.  Use MiraLax (over the counter) for constipation as needed.   Diet - low sodium heart healthy   Complete by: As directed    Discharge instructions   Complete by: As directed    Maintain surgical dressing until follow up in the clinic. If the edges start to pull up, may reinforce with tape. If the dressing is no longer working, may remove and cover with gauze and tape, but must keep the area dry and clean.  Follow up in 2 weeks at [facility]. Call with any questions or concerns.   Increase activity slowly as tolerated   Complete by: As directed    Weight bearing as tolerated with assist device (walker, cane, etc) as directed, use it as long as suggested by your surgeon or therapist, typically at least 4-6 weeks.   TED hose   Complete by: As directed    Use stockings (TED hose) for 2 weeks on both leg(s).  You may remove them at night for sleeping.      Allergies as of 10/09/2019      Reactions   Codeine Itching   Sulfa Antibiotics    GI upset    Tape Itching, Rash      Medication List    STOP taking these medications   acetaminophen 325 MG tablet Commonly known as: TYLENOL   diphenhydramine-acetaminophen 25-500 MG Tabs tablet Commonly known as: TYLENOL PM     TAKE these medications   albuterol 108 (90 Base) MCG/ACT inhaler Commonly known as: VENTOLIN HFA Inhale 1-2 puffs into the lungs every 6 (six) hours as needed for wheezing or shortness of breath.   aspirin 81 MG chewable tablet Commonly known as: Aspirin Childrens Chew 1 tablet (81 mg total) by mouth 2 (two) times daily. Take for 4 weeks, then resume regular dose.   Bepreve 1.5 % Soln Generic drug: Bepotastine Besilate Place 1 drop into both eyes daily as needed (itchy eyes).   docusate sodium 100 MG  capsule Commonly known as: Colace Take 1 capsule (100 mg total) by mouth 2 (two) times daily.   EpiPen 2-Pak 0.3 mg/0.3 mL Soaj injection Generic drug: EPINEPHrine Inject 0.3 mg into the muscle as needed for anaphylaxis.   ferrous sulfate 325 (65 FE) MG tablet Commonly known as: FerrouSul Take 1 tablet (325 mg total) by mouth 3 (three) times daily with meals for 14 days.   fluticasone 50 MCG/ACT nasal spray Commonly known as: FLONASE Place 2 sprays into both nostrils daily.   Glucos-Chondroit-MSM Complex Tabs Take 3 tablets by mouth daily.   HYDROcodone-acetaminophen 7.5-325 MG tablet Commonly known as: Norco Take 1-2 tablets by mouth every 4 (four) hours as needed for moderate pain.   loratadine 10 MG tablet Commonly known as: CLARITIN Take 10 mg  by mouth daily as needed for allergies.   methocarbamol 500 MG tablet Commonly known as: Robaxin Take 1 tablet (500 mg total) by mouth every 6 (six) hours as needed for muscle spasms.   montelukast 10 MG tablet Commonly known as: SINGULAIR Take 10 mg by mouth daily as needed (allergies).   Olopatadine HCl 0.6 % Soln Place 2 sprays into both nostrils daily.   omeprazole 20 MG capsule Commonly known as: PRILOSEC Take 20 mg by mouth daily as needed (acid reflux).   polyethylene glycol 17 g packet Commonly known as: MIRALAX / GLYCOLAX Take 17 g by mouth 2 (two) times daily.   sertraline 100 MG tablet Commonly known as: ZOLOFT Take 150 mg by mouth daily.   valACYclovir 1000 MG tablet Commonly known as: VALTREX Take 2,000 mg by mouth daily as needed (cold sores).   zolpidem 5 MG tablet Commonly known as: AMBIEN Take 2.5 mg by mouth at bedtime as needed for sleep.            Discharge Care Instructions  (From admission, onward)         Start     Ordered   10/08/19 0000  Change dressing    Comments: Maintain surgical dressing until follow up in the clinic. If the edges start to pull up, may reinforce with tape.  If the dressing is no longer working, may remove and cover with gauze and tape, but must keep the area dry and clean.  Call with any questions or concerns.   10/08/19 0818           Signed: West Pugh. Elston Aldape   PA-C  10/15/2019, 8:35 AM

## 2020-08-10 DIAGNOSIS — R1032 Left lower quadrant pain: Secondary | ICD-10-CM | POA: Diagnosis not present

## 2020-08-11 ENCOUNTER — Other Ambulatory Visit: Payer: Self-pay | Admitting: Family Medicine

## 2020-08-11 ENCOUNTER — Other Ambulatory Visit: Payer: Self-pay

## 2020-08-11 ENCOUNTER — Encounter (HOSPITAL_COMMUNITY): Payer: Self-pay | Admitting: Emergency Medicine

## 2020-08-11 DIAGNOSIS — J4520 Mild intermittent asthma, uncomplicated: Secondary | ICD-10-CM | POA: Insufficient documentation

## 2020-08-11 DIAGNOSIS — K5792 Diverticulitis of intestine, part unspecified, without perforation or abscess without bleeding: Secondary | ICD-10-CM | POA: Insufficient documentation

## 2020-08-11 DIAGNOSIS — I10 Essential (primary) hypertension: Secondary | ICD-10-CM | POA: Diagnosis not present

## 2020-08-11 DIAGNOSIS — Z9049 Acquired absence of other specified parts of digestive tract: Secondary | ICD-10-CM | POA: Diagnosis not present

## 2020-08-11 DIAGNOSIS — Z85828 Personal history of other malignant neoplasm of skin: Secondary | ICD-10-CM | POA: Diagnosis not present

## 2020-08-11 DIAGNOSIS — K5790 Diverticulosis of intestine, part unspecified, without perforation or abscess without bleeding: Secondary | ICD-10-CM | POA: Diagnosis present

## 2020-08-11 DIAGNOSIS — R1032 Left lower quadrant pain: Secondary | ICD-10-CM

## 2020-08-11 DIAGNOSIS — Z96642 Presence of left artificial hip joint: Secondary | ICD-10-CM | POA: Diagnosis not present

## 2020-08-11 DIAGNOSIS — K5732 Diverticulitis of large intestine without perforation or abscess without bleeding: Secondary | ICD-10-CM | POA: Diagnosis not present

## 2020-08-11 LAB — COMPREHENSIVE METABOLIC PANEL
ALT: 20 U/L (ref 0–44)
AST: 21 U/L (ref 15–41)
Albumin: 4.4 g/dL (ref 3.5–5.0)
Alkaline Phosphatase: 68 U/L (ref 38–126)
Anion gap: 11 (ref 5–15)
BUN: 10 mg/dL (ref 8–23)
CO2: 25 mmol/L (ref 22–32)
Calcium: 9.9 mg/dL (ref 8.9–10.3)
Chloride: 99 mmol/L (ref 98–111)
Creatinine, Ser: 0.66 mg/dL (ref 0.44–1.00)
GFR, Estimated: >60 mL/min (ref >60)
Glucose, Bld: 109 mg/dL — ABNORMAL HIGH (ref 70–99)
Potassium: 4 mmol/L (ref 3.5–5.1)
Sodium: 135 mmol/L (ref 135–145)
Total Bilirubin: 0.6 mg/dL (ref 0.3–1.2)
Total Protein: 8 g/dL (ref 6.5–8.1)

## 2020-08-11 LAB — CBC
HCT: 41.1 % (ref 36.0–46.0)
Hemoglobin: 14 g/dL (ref 12.0–15.0)
MCH: 28 pg (ref 26.0–34.0)
MCHC: 34.1 g/dL (ref 30.0–36.0)
MCV: 82.2 fL (ref 80.0–100.0)
Platelets: 270 10*3/uL (ref 150–400)
RBC: 5 MIL/uL (ref 3.87–5.11)
RDW: 13.2 % (ref 11.5–15.5)
WBC: 10.8 10*3/uL — ABNORMAL HIGH (ref 4.0–10.5)
nRBC: 0 % (ref 0.0–0.2)

## 2020-08-11 LAB — URINALYSIS, ROUTINE W REFLEX MICROSCOPIC
Bilirubin Urine: NEGATIVE
Glucose, UA: NEGATIVE mg/dL
Hgb urine dipstick: NEGATIVE
Ketones, ur: 5 mg/dL — AB
Leukocytes,Ua: NEGATIVE
Nitrite: NEGATIVE
Protein, ur: NEGATIVE mg/dL
Specific Gravity, Urine: 1.011 (ref 1.005–1.030)
pH: 7 (ref 5.0–8.0)

## 2020-08-11 LAB — LIPASE, BLOOD: Lipase: 30 U/L (ref 11–51)

## 2020-08-11 MED ORDER — IBUPROFEN 200 MG PO TABS
400.0000 mg | ORAL_TABLET | Freq: Once | ORAL | Status: AC | PRN
  Administered 2020-08-11: 400 mg via ORAL
  Filled 2020-08-11: qty 2

## 2020-08-11 NOTE — ED Triage Notes (Signed)
Per pt, states history of diverticulitis-states LLQ pain-PCP prescribed Augmentin, took first dose today-states PCP told her to go to ED if she started having fevers-states she had one today 100.6

## 2020-08-12 ENCOUNTER — Encounter (HOSPITAL_COMMUNITY): Payer: Self-pay

## 2020-08-12 ENCOUNTER — Emergency Department (HOSPITAL_COMMUNITY)
Admission: EM | Admit: 2020-08-12 | Discharge: 2020-08-12 | Disposition: A | Discharge status: Home / Self Care | Payer: Medicare Other | Attending: Emergency Medicine | Admitting: Emergency Medicine

## 2020-08-12 ENCOUNTER — Emergency Department (HOSPITAL_COMMUNITY): Payer: Medicare Other

## 2020-08-12 DIAGNOSIS — Z9049 Acquired absence of other specified parts of digestive tract: Secondary | ICD-10-CM | POA: Diagnosis not present

## 2020-08-12 DIAGNOSIS — Z96642 Presence of left artificial hip joint: Secondary | ICD-10-CM | POA: Diagnosis not present

## 2020-08-12 DIAGNOSIS — K5732 Diverticulitis of large intestine without perforation or abscess without bleeding: Secondary | ICD-10-CM | POA: Diagnosis not present

## 2020-08-12 DIAGNOSIS — K5792 Diverticulitis of intestine, part unspecified, without perforation or abscess without bleeding: Secondary | ICD-10-CM

## 2020-08-12 MED ORDER — ONDANSETRON HCL 4 MG/2ML IJ SOLN
4.0000 mg | Freq: Once | INTRAMUSCULAR | Status: AC
  Administered 2020-08-12: 4 mg via INTRAVENOUS
  Filled 2020-08-12: qty 2

## 2020-08-12 MED ORDER — LACTATED RINGERS IV BOLUS
1000.0000 mL | Freq: Once | INTRAVENOUS | Status: AC
  Administered 2020-08-12: 1000 mL via INTRAVENOUS

## 2020-08-12 MED ORDER — FENTANYL CITRATE (PF) 100 MCG/2ML IJ SOLN
50.0000 ug | Freq: Once | INTRAMUSCULAR | Status: AC
  Administered 2020-08-12: 50 ug via INTRAVENOUS
  Filled 2020-08-12: qty 2

## 2020-08-12 MED ORDER — OXYCODONE-ACETAMINOPHEN 5-325 MG PO TABS: 2.0000 | ORAL_TABLET | ORAL | 0 refills | Status: DC | PRN

## 2020-08-12 MED ORDER — ONDANSETRON HCL 4 MG PO TABS: 4.0000 mg | ORAL_TABLET | Freq: Three times a day (TID) | ORAL | 0 refills | Status: DC | PRN

## 2020-08-12 MED ORDER — IOHEXOL 300 MG/ML  SOLN
100.0000 mL | Freq: Once | INTRAMUSCULAR | Status: AC | PRN
  Administered 2020-08-12: 100 mL via INTRAVENOUS

## 2020-08-12 NOTE — ED Provider Notes (Signed)
[facility] DEPT Provider Note   CSN: [id number] Arrival date & time: 08/11/20  1625     History Chief Complaint  Patient presents with  . Abdominal Pain    Traci Hudson is a 65 y.o. female.  Has had diverticula lightest multiple times in the past.  Patient states that she had another flareup started on Christmas.  Initially was intermittent became more persistent so she called her doctor on the phone who prescribed her Augmentin over the phone without evaluating her.  Stated that he would order CT scan.  He stated if she had a fever she need to go the emergency room immediately for further evaluation.  She states that she had a fever today so she presents here.  She is afebrile now.  She feels okay besides a little bit of pain.  She had some intermittent nausea but not anything significant.   Abdominal Pain      Past Medical History:  Diagnosis Date  . Asthma   . Diverticulitis   . Hypercholesteremia   . Hypertension   . Insomnia   . Pre-diabetes   . Skin cancer     Patient Active Problem List   Diagnosis Date Noted  . Overweight (BMI 25.0-29.9) 10/09/2019  . 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 SBO (small bowel obstruction) ([place]) 01/13/2016  . Upper airway cough syndrome vs cough variant asthma 01/31/2015  . Solitary pulmonary nodule 01/31/2015  . Intermittent asthma, well controlled 01/31/2015    Past Surgical History:  Procedure Laterality Date  . ABDOMINAL HYSTERECTOMY    . APPENDECTOMY  1998  . BRAIN SURGERY  1969  . BREAST REDUCTION SURGERY  2000  . CHOLECYSTECTOMY    . TOTAL HIP ARTHROPLASTY Left 10/08/2019   Procedure: TOTAL HIP ARTHROPLASTY ANTERIOR APPROACH;  Surgeon: Paralee Cancel, MD;  Location: [facility];  Service: Orthopedics;  Laterality: Left;  70 mins  . VESICOVAGINAL FISTULA CLOSURE W/ TAH       OB History   No obstetric history on file.     Family History  Problem Relation Age of Onset  . Emphysema Mother         smoked  . Lung cancer Mother        smoked    Social History   Tobacco Use  . Smoking status: Never Smoker  . Smokeless tobacco: Never Used  Substance Use Topics  . Alcohol use: Yes    Alcohol/week: 2.0 standard drinks    Types: 2 Glasses of wine per week    Comment: occasionally  . Drug use: No    Home Medications Prior to Admission medications   Medication Sig Start Date End Date Taking? Authorizing Provider  ondansetron (ZOFRAN) 4 MG tablet Take 1 tablet (4 mg total) by mouth every 8 (eight) hours as needed for nausea or vomiting. 08/12/20  Yes Johnell Bas, Corene Cornea, MD  oxyCODONE-acetaminophen (PERCOCET) 5-325 MG tablet Take 2 tablets by mouth every 4 (four) hours as needed. 08/12/20  Yes Clyde Upshaw, Corene Cornea, MD  albuterol (PROVENTIL HFA;VENTOLIN HFA) 108 (90 Base) MCG/ACT inhaler Inhale 1-2 puffs into the lungs every 6 (six) hours as needed for wheezing or shortness of breath.     [provider]  Bepotastine Besilate (BEPREVE) 1.5 % SOLN Place 1 drop into both eyes daily as needed (itchy eyes).    [provider]  docusate sodium (COLACE) 100 MG capsule Take 1 capsule (100 mg total) by mouth 2 (two) times daily. 10/08/19   Babish,  Molli Hazard, PA-C  EPINEPHrine (EPIPEN 2-PAK) 0.3 mg/0.3 mL IJ SOAJ injection Inject 0.3 mg into the muscle as needed for anaphylaxis.    [provider]  ferrous sulfate (FERROUSUL) 325 (65 FE) MG tablet Take 1 tablet (325 mg total) by mouth 3 (three) times daily with meals for 14 days. 10/08/19 10/22/19  Lanney Gins, PA-C  fluticasone (FLONASE) 50 MCG/ACT nasal spray Place 2 sprays into both nostrils daily.  10/22/15   [provider]  HYDROcodone-acetaminophen (NORCO) 7.5-325 MG tablet Take 1-2 tablets by mouth every 4 (four) hours as needed for moderate pain. 10/08/19   Lanney Gins, PA-C  loratadine (CLARITIN) 10 MG tablet Take 10 mg by mouth daily as needed for allergies.     [provider]  methocarbamol (ROBAXIN) 500 MG  tablet Take 1 tablet (500 mg total) by mouth every 6 (six) hours as needed for muscle spasms. 10/08/19   Lanney Gins, PA-C  Misc Natural Products (GLUCOS-CHONDROIT-MSM COMPLEX) TABS Take 3 tablets by mouth daily.    [provider]  montelukast (SINGULAIR) 10 MG tablet Take 10 mg by mouth daily as needed (allergies).     [provider]  Olopatadine HCl 0.6 % SOLN Place 2 sprays into both nostrils daily.  06/13/18   [provider]  omeprazole (PRILOSEC) 20 MG capsule Take 20 mg by mouth daily as needed (acid reflux).    [provider]  polyethylene glycol (MIRALAX / GLYCOLAX) 17 g packet Take 17 g by mouth 2 (two) times daily. 10/08/19   Lanney Gins, PA-C  sertraline (ZOLOFT) 100 MG tablet Take 150 mg by mouth daily.  11/09/15   [provider]  valACYclovir (VALTREX) 1000 MG tablet Take 2,000 mg by mouth daily as needed (cold sores).    [provider]  zolpidem (AMBIEN) 5 MG tablet Take 2.5 mg by mouth at bedtime as needed for sleep.     [provider]    Allergies    Codeine, Sulfa antibiotics, and Tape  Review of Systems   Review of Systems  Gastrointestinal: Positive for abdominal pain.  All other systems reviewed and are negative.   Physical Exam Updated Vital Signs BP 125/77 (BP Location: Right Arm)   Pulse 70   Temp (!) 97.4 F (36.3 C) (Oral)   Resp 18   SpO2 95%   Physical Exam Vitals and nursing note reviewed.  Constitutional:      Appearance: She is well-developed and well-nourished.  HENT:     Head: Normocephalic and atraumatic.     Mouth/Throat:     Mouth: Mucous membranes are moist.     Pharynx: Oropharynx is clear.  Eyes:     Pupils: Pupils are equal, round, and reactive to light.  Cardiovascular:     Rate and Rhythm: Normal rate and regular rhythm.  Pulmonary:     Effort: No respiratory distress.     Breath sounds: No stridor.  Abdominal:     General: Abdomen is flat. There is no  distension.  Musculoskeletal:        General: No swelling or tenderness. Normal range of motion.     Cervical back: Normal range of motion.  Skin:    General: Skin is warm and dry.  Neurological:     General: No focal deficit present.     Mental Status: She is alert.     ED Results / Procedures / Treatments   Labs (all labs ordered are listed, but only abnormal results are displayed)  Labs Reviewed  COMPREHENSIVE METABOLIC PANEL - Abnormal; Notable for the following components:      Result Value   Glucose, Bld 109 (*)    All other components within normal limits  CBC - Abnormal; Notable for the following components:   WBC 10.8 (*)    All other components within normal limits  URINALYSIS, ROUTINE W REFLEX MICROSCOPIC - Abnormal; Notable for the following components:   Ketones, ur 5 (*)    All other components within normal limits  LIPASE, BLOOD    EKG None  Radiology CT ABDOMEN PELVIS W CONTRAST  Result Date: 08/12/2020 CLINICAL Hudson:  Initial evaluation for acute left lower quadrant pain. EXAM: CT ABDOMEN AND PELVIS WITH CONTRAST TECHNIQUE: Multidetector CT imaging of the abdomen and pelvis was performed using the standard protocol following bolus administration of intravenous contrast. CONTRAST:  160mL OMNIPAQUE IOHEXOL 300 MG/ML  SOLN COMPARISON:  Prior CT from 08/25/2018. FINDINGS: Lower chest: Minimal subsegmental atelectatic changes seen within the visualized lung bases. Visualized lungs are otherwise clear. Hepatobiliary: Liver demonstrates a normal contrast enhanced appearance. Gallbladder surgically absent. No biliary dilatation. Pancreas: Pancreas within normal limits. Spleen: Spleen within normal limits. Adrenals/Urinary Tract: Adrenal glands are normal. Kidneys equal in size without nephrolithiasis, hydronephrosis, or focal enhancing renal mass. No hydroureter. Partially distended bladder grossly within normal limits, although evaluation limited by streak artifact from a  left hip arthroplasty. Stomach/Bowel: Stomach within normal limits. No evidence for bowel obstruction. Appendix is absent. Extensive diverticulosis seen involving the descending and sigmoid colon. There is associated wall thickening with inflammatory stranding about the sigmoid colon, consistent with acute diverticulitis. No evidence for perforation or other complication at this time. Vascular/Lymphatic: Normal intravascular enhancement seen throughout the intra-abdominal aorta. No aneurysm. Mesenteric vessels patent proximally. No adenopathy. Reproductive: Uterus appears to be absent.  No adnexal mass. Other: No free air or fluid. Musculoskeletal: Left total hip arthroplasty in place. No acute osseous finding. No discrete or worrisome osseous lesions. IMPRESSION: 1. Findings consistent with acute sigmoid diverticulitis. No evidence for perforation or other complication at this time. 2. No other acute intra-abdominal or pelvic process. 3. Status post cholecystectomy, appendectomy, and hysterectomy. Electronically Signed   By: Jeannine Boga M.D.   On: 08/12/2020 03:56    Procedures Procedures (including critical care time)  Medications Ordered in ED Medications  ibuprofen (ADVIL) tablet 400 mg (400 mg Oral Given 08/11/20 2225)  ondansetron (ZOFRAN) injection 4 mg (4 mg Intravenous Given 08/12/20 0227)  lactated ringers bolus 1,000 mL (0 mLs Intravenous Stopped 08/12/20 0350)  fentaNYL (SUBLIMAZE) injection 50 mcg (50 mcg Intravenous Given 08/12/20 0351)  iohexol (OMNIPAQUE) 300 MG/ML solution 100 mL (100 mLs Intravenous Contrast Given 08/12/20 0308)    ED Course  I have reviewed the triage vital signs and the nursing notes.  Pertinent labs & imaging results that were available during my care of the patient were reviewed by me and considered in my medical decision making (see chart for details).    MDM Rules/Calculators/A&P                         eval for e/o complicated diverticulitis.  Symptomatic treatment in mean time.   Uncomplicated diverticulitis. Symptoms controlled. Can follow up with pcp.  Final Clinical Impression(s) / ED Diagnoses Final diagnoses:  Diverticulitis    Rx / DC Orders ED Discharge Orders         Ordered    oxyCODONE-acetaminophen (PERCOCET) 5-325 MG tablet  Every 4 hours PRN        08/12/20 0416    ondansetron (ZOFRAN) 4 MG tablet  Every 8 hours PRN        08/12/20 0416           Davie Sagona, Corene Cornea, MD 08/12/20 [phone]

## 2020-08-31 DIAGNOSIS — J3089 Other allergic rhinitis: Secondary | ICD-10-CM | POA: Diagnosis not present

## 2020-08-31 DIAGNOSIS — J301 Allergic rhinitis due to pollen: Secondary | ICD-10-CM | POA: Diagnosis not present

## 2020-09-03 DIAGNOSIS — J301 Allergic rhinitis due to pollen: Secondary | ICD-10-CM | POA: Diagnosis not present

## 2020-09-03 DIAGNOSIS — J3089 Other allergic rhinitis: Secondary | ICD-10-CM | POA: Diagnosis not present

## 2020-09-07 DIAGNOSIS — J301 Allergic rhinitis due to pollen: Secondary | ICD-10-CM | POA: Diagnosis not present

## 2020-09-07 DIAGNOSIS — J3089 Other allergic rhinitis: Secondary | ICD-10-CM | POA: Diagnosis not present

## 2020-09-17 DIAGNOSIS — J301 Allergic rhinitis due to pollen: Secondary | ICD-10-CM | POA: Diagnosis not present

## 2020-09-17 DIAGNOSIS — J3089 Other allergic rhinitis: Secondary | ICD-10-CM | POA: Diagnosis not present

## 2020-09-28 DIAGNOSIS — J301 Allergic rhinitis due to pollen: Secondary | ICD-10-CM | POA: Diagnosis not present

## 2020-09-28 DIAGNOSIS — J3089 Other allergic rhinitis: Secondary | ICD-10-CM | POA: Diagnosis not present

## 2020-10-06 DIAGNOSIS — J3089 Other allergic rhinitis: Secondary | ICD-10-CM | POA: Diagnosis not present

## 2020-10-06 DIAGNOSIS — J301 Allergic rhinitis due to pollen: Secondary | ICD-10-CM | POA: Diagnosis not present

## 2020-10-12 DIAGNOSIS — L814 Other melanin hyperpigmentation: Secondary | ICD-10-CM | POA: Diagnosis not present

## 2020-10-12 DIAGNOSIS — L578 Other skin changes due to chronic exposure to nonionizing radiation: Secondary | ICD-10-CM | POA: Diagnosis not present

## 2020-10-12 DIAGNOSIS — Z85820 Personal history of malignant melanoma of skin: Secondary | ICD-10-CM | POA: Diagnosis not present

## 2020-10-12 DIAGNOSIS — L820 Inflamed seborrheic keratosis: Secondary | ICD-10-CM | POA: Diagnosis not present

## 2020-10-12 DIAGNOSIS — Z85828 Personal history of other malignant neoplasm of skin: Secondary | ICD-10-CM | POA: Diagnosis not present

## 2020-10-16 DIAGNOSIS — J3089 Other allergic rhinitis: Secondary | ICD-10-CM | POA: Diagnosis not present

## 2020-10-16 DIAGNOSIS — J301 Allergic rhinitis due to pollen: Secondary | ICD-10-CM | POA: Diagnosis not present

## 2020-10-23 DIAGNOSIS — J3089 Other allergic rhinitis: Secondary | ICD-10-CM | POA: Diagnosis not present

## 2020-10-23 DIAGNOSIS — J301 Allergic rhinitis due to pollen: Secondary | ICD-10-CM | POA: Diagnosis not present

## 2020-10-30 DIAGNOSIS — J3089 Other allergic rhinitis: Secondary | ICD-10-CM | POA: Diagnosis not present

## 2020-10-30 DIAGNOSIS — J301 Allergic rhinitis due to pollen: Secondary | ICD-10-CM | POA: Diagnosis not present

## 2020-11-06 DIAGNOSIS — J301 Allergic rhinitis due to pollen: Secondary | ICD-10-CM | POA: Diagnosis not present

## 2020-11-06 DIAGNOSIS — J3089 Other allergic rhinitis: Secondary | ICD-10-CM | POA: Diagnosis not present

## 2020-11-15 DIAGNOSIS — J3089 Other allergic rhinitis: Secondary | ICD-10-CM | POA: Diagnosis not present

## 2020-11-15 DIAGNOSIS — J301 Allergic rhinitis due to pollen: Secondary | ICD-10-CM | POA: Diagnosis not present

## 2020-12-04 DIAGNOSIS — R21 Rash and other nonspecific skin eruption: Secondary | ICD-10-CM | POA: Diagnosis not present

## 2020-12-08 DIAGNOSIS — J301 Allergic rhinitis due to pollen: Secondary | ICD-10-CM | POA: Diagnosis not present

## 2020-12-08 DIAGNOSIS — J3089 Other allergic rhinitis: Secondary | ICD-10-CM | POA: Diagnosis not present

## 2020-12-18 DIAGNOSIS — J3089 Other allergic rhinitis: Secondary | ICD-10-CM | POA: Diagnosis not present

## 2020-12-18 DIAGNOSIS — J301 Allergic rhinitis due to pollen: Secondary | ICD-10-CM | POA: Diagnosis not present

## 2020-12-24 DIAGNOSIS — J301 Allergic rhinitis due to pollen: Secondary | ICD-10-CM | POA: Diagnosis not present

## 2020-12-24 DIAGNOSIS — J3089 Other allergic rhinitis: Secondary | ICD-10-CM | POA: Diagnosis not present

## 2021-01-05 DIAGNOSIS — J301 Allergic rhinitis due to pollen: Secondary | ICD-10-CM | POA: Diagnosis not present

## 2021-01-05 DIAGNOSIS — J3089 Other allergic rhinitis: Secondary | ICD-10-CM | POA: Diagnosis not present

## 2021-01-12 DIAGNOSIS — J3089 Other allergic rhinitis: Secondary | ICD-10-CM | POA: Diagnosis not present

## 2021-01-12 DIAGNOSIS — J301 Allergic rhinitis due to pollen: Secondary | ICD-10-CM | POA: Diagnosis not present

## 2021-01-22 DIAGNOSIS — J3089 Other allergic rhinitis: Secondary | ICD-10-CM | POA: Diagnosis not present

## 2021-01-22 DIAGNOSIS — J301 Allergic rhinitis due to pollen: Secondary | ICD-10-CM | POA: Diagnosis not present

## 2021-01-28 DIAGNOSIS — J301 Allergic rhinitis due to pollen: Secondary | ICD-10-CM | POA: Diagnosis not present

## 2021-01-28 DIAGNOSIS — H1045 Other chronic allergic conjunctivitis: Secondary | ICD-10-CM | POA: Diagnosis not present

## 2021-01-28 DIAGNOSIS — J3089 Other allergic rhinitis: Secondary | ICD-10-CM | POA: Diagnosis not present

## 2021-01-28 DIAGNOSIS — J4530 Mild persistent asthma, uncomplicated: Secondary | ICD-10-CM | POA: Diagnosis not present

## 2021-02-05 DIAGNOSIS — J301 Allergic rhinitis due to pollen: Secondary | ICD-10-CM | POA: Diagnosis not present

## 2021-02-05 DIAGNOSIS — J3089 Other allergic rhinitis: Secondary | ICD-10-CM | POA: Diagnosis not present

## 2021-02-09 DIAGNOSIS — J301 Allergic rhinitis due to pollen: Secondary | ICD-10-CM | POA: Diagnosis not present

## 2021-02-09 DIAGNOSIS — J3089 Other allergic rhinitis: Secondary | ICD-10-CM | POA: Diagnosis not present

## 2021-02-11 DIAGNOSIS — J3089 Other allergic rhinitis: Secondary | ICD-10-CM | POA: Diagnosis not present

## 2021-02-11 DIAGNOSIS — J301 Allergic rhinitis due to pollen: Secondary | ICD-10-CM | POA: Diagnosis not present

## 2021-02-16 DIAGNOSIS — J301 Allergic rhinitis due to pollen: Secondary | ICD-10-CM | POA: Diagnosis not present

## 2021-02-16 DIAGNOSIS — J3089 Other allergic rhinitis: Secondary | ICD-10-CM | POA: Diagnosis not present

## 2021-02-23 DIAGNOSIS — Z23 Encounter for immunization: Secondary | ICD-10-CM | POA: Diagnosis not present

## 2021-02-23 DIAGNOSIS — Z0000 Encounter for general adult medical examination without abnormal findings: Secondary | ICD-10-CM | POA: Diagnosis not present

## 2021-02-23 DIAGNOSIS — M8580 Other specified disorders of bone density and structure, unspecified site: Secondary | ICD-10-CM | POA: Diagnosis not present

## 2021-02-23 DIAGNOSIS — G2581 Restless legs syndrome: Secondary | ICD-10-CM | POA: Diagnosis not present

## 2021-02-23 DIAGNOSIS — E1169 Type 2 diabetes mellitus with other specified complication: Secondary | ICD-10-CM | POA: Diagnosis not present

## 2021-02-23 DIAGNOSIS — E7800 Pure hypercholesterolemia, unspecified: Secondary | ICD-10-CM | POA: Diagnosis not present

## 2021-02-23 DIAGNOSIS — G4700 Insomnia, unspecified: Secondary | ICD-10-CM | POA: Diagnosis not present

## 2021-02-24 DIAGNOSIS — J3089 Other allergic rhinitis: Secondary | ICD-10-CM | POA: Diagnosis not present

## 2021-02-24 DIAGNOSIS — J301 Allergic rhinitis due to pollen: Secondary | ICD-10-CM | POA: Diagnosis not present

## 2021-03-01 DIAGNOSIS — J22 Unspecified acute lower respiratory infection: Secondary | ICD-10-CM | POA: Diagnosis not present

## 2021-03-12 DIAGNOSIS — J3089 Other allergic rhinitis: Secondary | ICD-10-CM | POA: Diagnosis not present

## 2021-03-12 DIAGNOSIS — J301 Allergic rhinitis due to pollen: Secondary | ICD-10-CM | POA: Diagnosis not present

## 2021-03-19 DIAGNOSIS — J3089 Other allergic rhinitis: Secondary | ICD-10-CM | POA: Diagnosis not present

## 2021-03-19 DIAGNOSIS — J301 Allergic rhinitis due to pollen: Secondary | ICD-10-CM | POA: Diagnosis not present

## 2021-03-23 DIAGNOSIS — J301 Allergic rhinitis due to pollen: Secondary | ICD-10-CM | POA: Diagnosis not present

## 2021-03-23 DIAGNOSIS — J3089 Other allergic rhinitis: Secondary | ICD-10-CM | POA: Diagnosis not present

## 2021-04-06 DIAGNOSIS — J301 Allergic rhinitis due to pollen: Secondary | ICD-10-CM | POA: Diagnosis not present

## 2021-04-06 DIAGNOSIS — J3089 Other allergic rhinitis: Secondary | ICD-10-CM | POA: Diagnosis not present

## 2021-04-06 DIAGNOSIS — Z1231 Encounter for screening mammogram for malignant neoplasm of breast: Secondary | ICD-10-CM | POA: Diagnosis not present

## 2021-04-16 DIAGNOSIS — J301 Allergic rhinitis due to pollen: Secondary | ICD-10-CM | POA: Diagnosis not present

## 2021-04-16 DIAGNOSIS — J3089 Other allergic rhinitis: Secondary | ICD-10-CM | POA: Diagnosis not present

## 2021-04-30 DIAGNOSIS — J3089 Other allergic rhinitis: Secondary | ICD-10-CM | POA: Diagnosis not present

## 2021-04-30 DIAGNOSIS — J301 Allergic rhinitis due to pollen: Secondary | ICD-10-CM | POA: Diagnosis not present

## 2021-05-04 DIAGNOSIS — J301 Allergic rhinitis due to pollen: Secondary | ICD-10-CM | POA: Diagnosis not present

## 2021-05-04 DIAGNOSIS — Z23 Encounter for immunization: Secondary | ICD-10-CM | POA: Diagnosis not present

## 2021-05-04 DIAGNOSIS — J3089 Other allergic rhinitis: Secondary | ICD-10-CM | POA: Diagnosis not present

## 2021-05-17 DIAGNOSIS — M1611 Unilateral primary osteoarthritis, right hip: Secondary | ICD-10-CM | POA: Diagnosis not present

## 2021-05-21 DIAGNOSIS — J3089 Other allergic rhinitis: Secondary | ICD-10-CM | POA: Diagnosis not present

## 2021-05-21 DIAGNOSIS — J301 Allergic rhinitis due to pollen: Secondary | ICD-10-CM | POA: Diagnosis not present

## 2021-06-01 DIAGNOSIS — J3089 Other allergic rhinitis: Secondary | ICD-10-CM | POA: Diagnosis not present

## 2021-06-01 DIAGNOSIS — J301 Allergic rhinitis due to pollen: Secondary | ICD-10-CM | POA: Diagnosis not present

## 2021-06-25 DIAGNOSIS — J301 Allergic rhinitis due to pollen: Secondary | ICD-10-CM | POA: Diagnosis not present

## 2021-06-25 DIAGNOSIS — J3089 Other allergic rhinitis: Secondary | ICD-10-CM | POA: Diagnosis not present

## 2021-07-22 NOTE — Patient Instructions (Addendum)
DUE TO COVID-19 ONLY ONE VISITOR IS ALLOWED TO COME WITH YOU AND STAY IN THE WAITING ROOM ONLY DURING PRE OP AND PROCEDURE.   **NO VISITORS ARE ALLOWED IN THE SHORT STAY AREA OR RECOVERY ROOM!!**  IF YOU WILL BE ADMITTED INTO THE HOSPITAL YOU ARE ALLOWED ONLY TWO SUPPORT PEOPLE DURING VISITATION HOURS ONLY (7 AM -8PM)    Up to two visitors ages 83+ are allowed at one time in a patient's room.  The visitors may rotate out with other people throughout the day.  Additionally, up to two children between the ages of 40 and 65 are allowed and do not count toward the number of allowed visitors.  Children within this age range must be accompanied by an adult visitor.  One adult visitor may remain with the patient overnight and must be in the room by 8 PM.  COVID SWAB TESTING MUST BE COMPLETED ON:  Friday 07-30-21 Between the hours of 8 and 3  **MUST PRESENT COMPLETED FORM AT TESTING SITE**    [facility] [place] (backside of the building)  You are not required to quarantine, however you are required to wear a well-fitted mask when you are out and around people not in your household.  Hand Hygiene often Do NOT share personal items Notify your provider if you are in close contact with someone who has COVID or you develop fever 100.4 or greater, new onset of sneezing, cough, sore throat, shortness of breath or body aches.        Your procedure is scheduled on: Tuesday, 08-03-21   Report to [facility] Main  Entrance     Report to admitting at 5:50 AM   Call this number if you have problems the morning of surgery [phone]   Do not eat food :After Midnight.   May have liquids until 5:30 AM day of surgery  CLEAR LIQUID DIET  Foods Allowed                                                                     Foods Excluded  Water, Black Coffee (no milk/no creamer) and tea, regular and decaf                              liquids that you cannot  Plain Jell-O in any  flavor  (No red)                         see through such as: Fruit ices (not with fruit pulp)                                 milk, soups, orange juice  Iced Popsicles (No red)                                    All solid food                             Apple juices Sports  drinks like Gatorade (No red) Lightly seasoned clear broth or consume(fat free) Sugar    Complete one G2 drink the morning of surgery at 5:30 AM the day of surgery.     The day of surgery:  Drink ONE (1) Pre-Surgery G2 the morning of surgery. Drink in one sitting. Do not sip.  This drink was given to you during your hospital  pre-op appointment visit. Nothing else to drink after completing the Pre-Surgery G2.          If you have questions, please contact your surgeons office.     Oral Hygiene is also important to reduce your risk of infection.                                    Remember - BRUSH YOUR TEETH THE MORNING OF SURGERY WITH YOUR REGULAR TOOTHPASTE   Do NOT smoke after Midnight  Take these medicines the morning of surgery with A SIP OF WATER:  Tylenol, Xyzal, Singulair, Omeprazole, Sertraline.  Okay to use inhalers, nasal sprays and eyedrops  DO NOT TAKE ANY ORAL DIABETIC MEDICATIONS DAY OF YOUR SURGERY                    Stop all vitamins and herbal supplements a week before surgery              You may not have any metal on your body including hair pins, jewelry, and body piercing             Do not wear make-up, lotions, powders, perfumes or deodorant  Do not wear nail polish including gel and S&S, artificial/acrylic nails, or any other type of covering on natural nails including finger and toenails. If you have artificial nails, gel coating, etc. that needs to be removed by a nail salon please have this removed prior to surgery or surgery may need to be canceled/ delayed if the surgeon/ anesthesia feels like they are unable to be safely monitored.   Do not shave  48 hours prior to surgery.    Do not bring valuables to the hospital. [place].   Contacts, dentures or bridgework may not be worn into surgery.   Bring small overnight bag day of surgery.   Please read over the following fact sheets you were given: IF YOU HAVE QUESTIONS ABOUT YOUR PRE OP INSTRUCTIONS PLEASE CALL [facility] - Preparing for Surgery Before surgery, you can play an important role.  Because skin is not sterile, your skin needs to be as free of germs as possible.  You can reduce the number of germs on your skin by washing with CHG (chlorahexidine gluconate) soap before surgery.  CHG is an antiseptic cleaner which kills germs and bonds with the skin to continue killing germs even after washing. Please DO NOT use if you have an allergy to CHG or antibacterial soaps.  If your skin becomes reddened/irritated stop using the CHG and inform your nurse when you arrive at Short Stay. Do not shave (including legs and underarms) for at least 48 hours prior to the first CHG shower.  You may shave your face/neck.  Please follow these instructions carefully:  1.  Shower with CHG Soap the night before surgery and the  morning of surgery.  2.  If you choose to wash your hair, wash your hair first  as usual with your normal  shampoo.  3.  After you shampoo, rinse your hair and body thoroughly to remove the shampoo.                             4.  Use CHG as you would any other liquid soap.  You can apply chg directly to the skin and wash.  Gently with a scrungie or clean washcloth.  5.  Apply the CHG Soap to your body ONLY FROM THE NECK DOWN.   Do   not use on face/ open                           Wound or open sores. Avoid contact with eyes, ears mouth and   genitals (private parts).                       Wash face,  Genitals (private parts) with your normal soap.             6.  Wash thoroughly, paying special attention to the area where your    surgery  will be  performed.  7.  Thoroughly rinse your body with warm water from the neck down.  8.  DO NOT shower/wash with your normal soap after using and rinsing off the CHG Soap.                9.  Pat yourself dry with a clean towel.            10.  Wear clean pajamas.            11.  Place clean sheets on your bed the night of your first shower and do not  sleep with pets. Day of Surgery : Do not apply any lotions/deodorants the morning of surgery.  Please wear clean clothes to the hospital/surgery center.  FAILURE TO FOLLOW THESE INSTRUCTIONS MAY RESULT IN THE CANCELLATION OF YOUR SURGERY  PATIENT SIGNATURE_________________________________  NURSE SIGNATURE__________________________________  ________________________________________________________________________   Adam Phenix  An incentive spirometer is a tool that can help keep your lungs clear and active. This tool measures how well you are filling your lungs with each breath. Taking long deep breaths may help reverse or decrease the chance of developing breathing (pulmonary) problems (especially infection) following: A long period of time when you are unable to move or be active. BEFORE THE PROCEDURE  If the spirometer includes an indicator to show your best effort, your nurse or respiratory therapist will set it to a desired goal. If possible, sit up straight or lean slightly forward. Try not to slouch. Hold the incentive spirometer in an upright position. INSTRUCTIONS FOR USE  Sit on the edge of your bed if possible, or sit up as far as you can in bed or on a chair. Hold the incentive spirometer in an upright position. Breathe out normally. Place the mouthpiece in your mouth and seal your lips tightly around it. Breathe in slowly and as deeply as possible, raising the piston or the ball toward the top of the column. Hold your breath for 3-5 seconds or for as long as possible. Allow the piston or ball to fall to the bottom of the  column. Remove the mouthpiece from your mouth and breathe out normally. Rest for a few seconds and repeat Steps 1 through 7 at least 10 times every 1-2 hours  when you are awake. Take your time and take a few normal breaths between deep breaths. The spirometer may include an indicator to show your best effort. Use the indicator as a goal to work toward during each repetition. After each set of 10 deep breaths, practice coughing to be sure your lungs are clear. If you have an incision (the cut made at the time of surgery), support your incision when coughing by placing a pillow or rolled up towels firmly against it. Once you are able to get out of bed, walk around indoors and cough well. You may stop using the incentive spirometer when instructed by your caregiver.  RISKS AND COMPLICATIONS Take your time so you do not get dizzy or light-headed. If you are in pain, you may need to take or ask for pain medication before doing incentive spirometry. It is harder to take a deep breath if you are having pain. AFTER USE Rest and breathe slowly and easily. It can be helpful to keep track of a log of your progress. Your caregiver can provide you with a simple table to help with this. If you are using the spirometer at home, follow these instructions: [place] IF:  You are having difficultly using the spirometer. You have trouble using the spirometer as often as instructed. Your pain medication is not giving enough relief while using the spirometer. You develop fever of 100.5 F (38.1 C) or higher. SEEK IMMEDIATE MEDICAL CARE IF:  You cough up bloody sputum that had not been present before. You develop fever of 102 F (38.9 C) or greater. You develop worsening pain at or near the incision site. MAKE SURE YOU:  Understand these instructions. Will watch your condition. Will get help right away if you are not doing well or get worse. Document Released: 12/05/2006 Document Revised: 10/17/2011  Document Reviewed: 02/05/2007 Exit[employer] Patient Information 2014 ExitCare, [place].   ________________________________________________________________________  WHAT IS A BLOOD TRANSFUSION? Blood Transfusion Information  A transfusion is the replacement of blood or some of its parts. Blood is made up of multiple cells which provide different functions. Red blood cells carry oxygen and are used for blood loss replacement. White blood cells fight against infection. Platelets control bleeding. Plasma helps clot blood. Other blood products are available for specialized needs, such as hemophilia or other clotting disorders. BEFORE THE TRANSFUSION  Who gives blood for transfusions?  Healthy volunteers who are fully evaluated to make sure their blood is safe. This is blood bank blood. Transfusion therapy is the safest it has ever been in the practice of medicine. Before blood is taken from a donor, a complete history is taken to make sure that person has no history of diseases nor engages in risky social behavior (examples are intravenous drug use or sexual activity with multiple partners). The donor's travel history is screened to minimize risk of transmitting infections, such as malaria. The donated blood is tested for signs of infectious diseases, such as HIV and hepatitis. The blood is then tested to be sure it is compatible with you in order to minimize the chance of a transfusion reaction. If you or a relative donates blood, this is often done in anticipation of surgery and is not appropriate for emergency situations. It takes many days to process the donated blood. RISKS AND COMPLICATIONS Although transfusion therapy is very safe and saves many lives, the main dangers of transfusion include:  Getting an infectious disease. Developing a transfusion reaction. This is an allergic reaction to  something in the blood you were given. Every precaution is taken to prevent this. The decision to have a blood  transfusion has been considered carefully by your caregiver before blood is given. Blood is not given unless the benefits outweigh the risks. AFTER THE TRANSFUSION Right after receiving a blood transfusion, you will usually feel much better and more energetic. This is especially true if your red blood cells have gotten low (anemic). The transfusion raises the level of the red blood cells which carry oxygen, and this usually causes an energy increase. The nurse administering the transfusion will monitor you carefully for complications. HOME CARE INSTRUCTIONS  No special instructions are needed after a transfusion. You may find your energy is better. Speak with your caregiver about any limitations on activity for underlying diseases you may have. SEEK MEDICAL CARE IF:  Your condition is not improving after your transfusion. You develop redness or irritation at the intravenous (IV) site. SEEK IMMEDIATE MEDICAL CARE IF:  Any of the following symptoms occur over the next 12 hours: Shaking chills. You have a temperature by mouth above 102 F (38.9 C), not controlled by medicine. Chest, back, or muscle pain. People around you feel you are not acting correctly or are confused. Shortness of breath or difficulty breathing. Dizziness and fainting. You get a rash or develop hives. You have a decrease in urine output. Your urine turns a dark color or changes to pink, red, or brown. Any of the following symptoms occur over the next 10 days: You have a temperature by mouth above 102 F (38.9 C), not controlled by medicine. Shortness of breath. Weakness after normal activity. The white part of the eye turns yellow (jaundice). You have a decrease in the amount of urine or are urinating less often. Your urine turns a dark color or changes to pink, red, or brown. Document Released: 07/22/2000 Document Revised: 10/17/2011 Document Reviewed: 03/10/2008 [facility] Patient Information 2014 [place],  [place].  _______________________________________________________________________

## 2021-07-22 NOTE — Progress Notes (Addendum)
COVID swab appointment: 07-30-21  COVID Vaccine Completed:  Yes x2 Date COVID Vaccine completed: Has received booster: COVID vaccine manufacturer: Pfizer      Date of COVID positive in last 90 days: No  PCP - London Pepper, MD Cardiologist - N/A  Chest x-ray - N/A EKG - 07-26-21 Epic Stress Test -  N/A ECHO -  N/A Cardiac Cath -  N/A Pacemaker/ICD device last checked: Spinal Cord Stimulator:  Sleep Study - N/A CPAP -   Fasting Blood Sugar - Prediabetes Checks Blood Sugar - No  Blood Thinner Instructions: N/A Aspirin Instructions: Last Dose:  Activity level:  Can go up a flight of stairs and perform activities of daily living without stopping and without symptoms of chest pain or shortness of breath.  Able to exercise without symptoms  Anesthesia review:  N/A  Patient denies shortness of breath, fever, cough and chest pain at PAT appointment   Patient verbalized understanding of instructions that were given to them at the PAT appointment. Patient was also instructed that they will need to review over the PAT instructions again at home before surgery.

## 2021-07-26 ENCOUNTER — Encounter (HOSPITAL_COMMUNITY): Payer: Self-pay

## 2021-07-26 ENCOUNTER — Other Ambulatory Visit: Payer: Self-pay

## 2021-07-26 ENCOUNTER — Encounter (HOSPITAL_COMMUNITY)
Admission: RE | Admit: 2021-07-26 | Discharge: 2021-07-26 | Disposition: A | Discharge status: Home / Self Care | Payer: Medicare Other | Source: Ambulatory Visit | Attending: Orthopedic Surgery | Admitting: Orthopedic Surgery

## 2021-07-26 VITALS — BP 132/94 | HR 78 | Temp 98.7°F | Resp 18 | Ht 65.0 in | Wt 156.0 lb

## 2021-07-26 DIAGNOSIS — Z01818 Encounter for other preprocedural examination: Secondary | ICD-10-CM | POA: Diagnosis not present

## 2021-07-26 DIAGNOSIS — R7303 Prediabetes: Secondary | ICD-10-CM | POA: Diagnosis not present

## 2021-07-26 DIAGNOSIS — M1611 Unilateral primary osteoarthritis, right hip: Secondary | ICD-10-CM | POA: Insufficient documentation

## 2021-07-26 HISTORY — DX: Depression, unspecified: F32.A

## 2021-07-26 HISTORY — DX: Gastro-esophageal reflux disease without esophagitis: K21.9

## 2021-07-26 HISTORY — DX: Dyspnea, unspecified: R06.00

## 2021-07-26 HISTORY — DX: Unspecified osteoarthritis, unspecified site: M19.90

## 2021-07-26 LAB — CBC
HCT: 43.6 % (ref 36.0–46.0)
Hemoglobin: 14.5 g/dL (ref 12.0–15.0)
MCH: 28 pg (ref 26.0–34.0)
MCHC: 33.3 g/dL (ref 30.0–36.0)
MCV: 84.2 fL (ref 80.0–100.0)
Platelets: 250 10*3/uL (ref 150–400)
RBC: 5.18 MIL/uL — ABNORMAL HIGH (ref 3.87–5.11)
RDW: 12.9 % (ref 11.5–15.5)
WBC: 5.5 10*3/uL (ref 4.0–10.5)
nRBC: 0 % (ref 0.0–0.2)

## 2021-07-26 LAB — COMPREHENSIVE METABOLIC PANEL
ALT: 28 U/L (ref 0–44)
AST: 29 U/L (ref 15–41)
Albumin: 4.6 g/dL (ref 3.5–5.0)
Alkaline Phosphatase: 63 U/L (ref 38–126)
Anion gap: 6 (ref 5–15)
BUN: 13 mg/dL (ref 8–23)
CO2: 24 mmol/L (ref 22–32)
Calcium: 9.2 mg/dL (ref 8.9–10.3)
Chloride: 105 mmol/L (ref 98–111)
Creatinine, Ser: 0.74 mg/dL (ref 0.44–1.00)
GFR, Estimated: >60 mL/min (ref >60)
Glucose, Bld: 105 mg/dL — ABNORMAL HIGH (ref 70–99)
Potassium: 4.3 mmol/L (ref 3.5–5.1)
Sodium: 135 mmol/L (ref 135–145)
Total Bilirubin: 0.6 mg/dL (ref 0.3–1.2)
Total Protein: 8 g/dL (ref 6.5–8.1)

## 2021-07-26 LAB — SURGICAL PCR SCREEN
MRSA, PCR: NEGATIVE
Staphylococcus aureus: NEGATIVE

## 2021-07-26 LAB — GLUCOSE, CAPILLARY: Glucose-Capillary: 99 mg/dL (ref 70–99)

## 2021-07-26 LAB — HEMOGLOBIN A1C
Hgb A1c MFr Bld: 6 % — ABNORMAL HIGH (ref 4.8–5.6)
Mean Plasma Glucose: 125.5 mg/dL

## 2021-07-29 DIAGNOSIS — R7303 Prediabetes: Secondary | ICD-10-CM | POA: Diagnosis not present

## 2021-07-29 DIAGNOSIS — Z01818 Encounter for other preprocedural examination: Secondary | ICD-10-CM | POA: Diagnosis not present

## 2021-07-29 DIAGNOSIS — M25559 Pain in unspecified hip: Secondary | ICD-10-CM | POA: Diagnosis not present

## 2021-07-29 NOTE — H&P (Signed)
TOTAL HIP ADMISSION H&P  Patient is admitted for right total hip arthroplasty.  Subjective:  Chief Complaint: right hip pain  HPI: Traci Hudson, 65 y.o. female, has a history of pain and functional disability in the right hip(s) due to arthritis and patient has failed non-surgical conservative treatments for greater than 12 weeks to include NSAID's and/or analgesics and activity modification.  Onset of symptoms was gradual starting 2 years ago with gradually worsening course since that time.The patient noted no past surgery on the right hip(s).  Patient currently rates pain in the right hip at 8 out of 10 with activity. Patient has worsening of pain with activity and weight bearing, pain that interfers with activities of daily living, and pain with passive range of motion. Patient has evidence of joint space narrowing by imaging studies. This condition presents safety issues increasing the risk of falls. There is no current active infection.  Patient Active Problem List   Diagnosis Date Noted   Overweight (BMI 25.0-29.9) 10/09/2019   S/P left THA, AA 10/08/2019   SBO (small bowel obstruction) ([place]) 01/13/2016   Upper airway cough syndrome vs cough variant asthma 01/31/2015   Solitary pulmonary nodule 01/31/2015   Intermittent asthma, well controlled 01/31/2015   Past Medical History:  Diagnosis Date   Arthritis    Asthma    Depression    Diverticulitis    Dyspnea    With asthma flares   GERD (gastroesophageal reflux disease)    Hypercholesteremia    Hypertension    Insomnia    Pre-diabetes    Skin cancer     Past Surgical History:  Procedure Laterality Date   ABDOMINAL HYSTERECTOMY     [place]   CHOLECYSTECTOMY     TOTAL HIP ARTHROPLASTY Left 10/08/2019   Procedure: TOTAL HIP ARTHROPLASTY ANTERIOR APPROACH;  Surgeon: Paralee Cancel, MD;  Location: [facility];  Service: Orthopedics;  Laterality: Left;  70 mins    VESICOVAGINAL FISTULA CLOSURE W/ TAH      No current facility-administered medications for this encounter.   Current Outpatient Medications  Medication Sig Dispense Refill Last Dose   acetaminophen (TYLENOL) 650 MG CR tablet Take 1,300 mg by mouth every 8 (eight) hours as needed for pain.      albuterol (PROVENTIL HFA;VENTOLIN HFA) 108 (90 Base) MCG/ACT inhaler Inhale 2 puffs into the lungs every 6 (six) hours as needed for wheezing or shortness of breath.      amoxicillin (AMOXIL) 500 MG capsule Take 2,000 mg by mouth See admin instructions. Take 2000 mg 1 hour prior to dental work      atorvastatin (LIPITOR) 10 MG tablet Take 10 mg by mouth daily in the afternoon.      Calcium Carb-Cholecalciferol (CALCIUM 600 + D PO) Take 2 tablets by mouth daily.      EPINEPHrine 0.3 mg/0.3 mL IJ SOAJ injection Inject 0.3 mg into the muscle as needed for anaphylaxis.      ferrous sulfate (FERROUSUL) 325 (65 FE) MG tablet Take 1 tablet (325 mg total) by mouth 3 (three) times daily with meals for 14 days. (Patient taking differently: Take 325 mg by mouth daily.) 42 tablet 0    fluticasone (FLONASE) 50 MCG/ACT nasal spray Place 2 sprays into both nostrils 3 (three) times daily.  4    ipratropium (ATROVENT) 0.06 % nasal spray Place 2 sprays into both nostrils 3 (three) times daily.  ketotifen (ZADITOR) 0.025 % ophthalmic solution Place 1 drop into both eyes daily as needed (allergies).      levocetirizine (XYZAL) 5 MG tablet Take 5 mg by mouth every other day. Alternating with Montelukast      montelukast (SINGULAIR) 10 MG tablet Take 10 mg by mouth every other day. Alternating with Xyzal      omeprazole (PRILOSEC) 20 MG capsule Take 20 mg by mouth daily as needed (acid reflux).      Polyethyl Glycol-Propyl Glycol (SYSTANE OP) Place 1 drop into both eyes daily as needed (dry eyes).      sertraline (ZOLOFT) 100 MG tablet Take 150 mg by mouth daily.   3    valACYclovir (VALTREX) 1000 MG tablet Take 2,000 mg by  mouth every 12 (twelve) hours as needed (cold sores).      zolpidem (AMBIEN) 5 MG tablet Take 2.5 mg by mouth at bedtime as needed for sleep. May take a second 2.5 mg dose if the first doesn't work      Geologist, engineering (GLUCOS-CHONDROIT-MSM COMPLEX) TABS Take 3 tablets by mouth daily.      Allergies  Allergen Reactions   Codeine Itching   Sulfa Antibiotics     GI upset    Tape Itching and Rash    Social History   Tobacco Use   Smoking status: Never   Smokeless tobacco: Never  Substance Use Topics   Alcohol use: Yes    Alcohol/week: 2.0 standard drinks    Types: 2 Glasses of wine per week    Comment: occasionally    Family History  Problem Relation Age of Onset   Emphysema Mother        smoked   Lung cancer Mother        smoked     Review of Systems  Constitutional:  Negative for chills and fever.  Respiratory:  Negative for cough and shortness of breath.   Cardiovascular:  Negative for chest pain.  Gastrointestinal:  Negative for nausea and vomiting.  Musculoskeletal:  Positive for arthralgias.    Objective:  Physical Exam Well nourished and well developed. General: Alert and oriented x3, cooperative and pleasant, no acute distress. Head: normocephalic, atraumatic, neck supple. Eyes: EOMI.  Musculoskeletal: Right Hip: No tenderness to palpation about the right greater trochanteric bursa. Pain with passive and active motion of the right hip. ROM 0-110 degrees flexion, 10 degrees internal rotation, 20 degrees external rotation, and 20 degrees abduction.  Calves soft and nontender. Motor function intact in LE. Strength 5/5 LE bilaterally. Neuro: Distal pulses 2+. Sensation to light touch intact in LE.  Vital signs in last 24 hours:    Labs:   Estimated body mass index is 25.96 kg/m as calculated from the following:   Height as of 07/26/21: 5\' 5"  (1.651 m).   Weight as of 07/26/21: 70.8 kg.   Imaging Review Plain radiographs demonstrate severe  degenerative joint disease of the right hip(s). The bone quality appears to be adequate for age and reported activity level.      Assessment/Plan:  End stage arthritis, right hip(s)  The patient history, physical examination, clinical judgement of the provider and imaging studies are consistent with end stage degenerative joint disease of the right hip(s) and total hip arthroplasty is deemed medically necessary. The treatment options including medical management, injection therapy, arthroscopy and arthroplasty were discussed at length. The risks and benefits of total hip arthroplasty were presented and reviewed. The risks due to aseptic loosening, infection,  stiffness, dislocation/subluxation,  thromboembolic complications and other imponderables were discussed.  The patient acknowledged the explanation, agreed to proceed with the plan and consent was signed. Patient is being admitted for inpatient treatment for surgery, pain control, PT, OT, prophylactic antibiotics, VTE prophylaxis, progressive ambulation and ADL's and discharge planning.The patient is planning to be discharged  home.  Therapy Plans: HEP Disposition: Home with friend Planned DVT Prophylaxis: aspirin 81mg  BID DME needed: none PCP: Dr. Orland Mustard, appointment on 07/19/21 TXA: IV Allergies: tape - rash, codeine - itching, sulfa - GI upset Anesthesia Concerns: none BMI: 26.5 Last HgbA1c: Not diabetic  Other: - Wants something for anxiety morning of surgery - Tramadol/Norco (send norco #20), celebrex, robaxin, tylenol (send ahead)    Costella Hatcher, PA-C Orthopedic Surgery [facility] [place] [phone]

## 2021-07-30 ENCOUNTER — Other Ambulatory Visit: Payer: Self-pay | Admitting: Orthopedic Surgery

## 2021-07-30 LAB — SARS CORONAVIRUS 2 (TAT 6-24 HRS): SARS Coronavirus 2: NEGATIVE

## 2021-08-02 NOTE — Anesthesia Preprocedure Evaluation (Addendum)
Anesthesia Evaluation  Patient identified by MRN, date of birth, ID band Patient awake    Reviewed: Allergy & Precautions, NPO status , Patient's Chart, lab work & pertinent test results  History of Anesthesia Complications Negative for: history of anesthetic complications  Airway Mallampati: II  TM Distance: >3 FB Neck ROM: Full    Dental no notable dental hx. (+) Partial Lower, Teeth Intact, Dental Advisory Given   Pulmonary asthma (s/p Covid 04/2020) ,    Pulmonary exam normal breath sounds clear to auscultation       Cardiovascular hypertension, Normal cardiovascular exam Rhythm:Regular Rate:Normal     Neuro/Psych PSYCHIATRIC DISORDERS Depression negative neurological ROS     GI/Hepatic Neg liver ROS, GERD  Medicated and Controlled,  Endo/Other  negative endocrine ROS  Renal/GU negative Renal ROS     Musculoskeletal  (+) Arthritis ,   Abdominal   Peds  Hematology negative hematology ROS (+)   Anesthesia Other Findings   Reproductive/Obstetrics                            Anesthesia Physical Anesthesia Plan  ASA: 2  Anesthesia Plan: Spinal   Post-op Pain Management: Tylenol PO (pre-op)   Induction:   PONV Risk Score and Plan: 2 and Treatment may vary due to age or medical condition and Propofol infusion  Airway Management Planned: Natural Airway and Simple Face Mask  Additional Equipment: None  Intra-op Plan:   Post-operative Plan:   Informed Consent: I have reviewed the patients History and Physical, chart, labs and discussed the procedure including the risks, benefits and alternatives for the proposed anesthesia with the patient or authorized representative who has indicated his/her understanding and acceptance.       Plan Discussed with: CRNA and Anesthesiologist  Anesthesia Plan Comments: (Labs reviewed, platelets acceptable. Discussed risks and benefits of spinal,  including spinal/epidural hematoma, infection, failed block, and PDPH. Patient expressed understanding and wished to proceed. )       Anesthesia Quick Evaluation

## 2021-08-03 ENCOUNTER — Ambulatory Visit (HOSPITAL_COMMUNITY): Payer: Medicare Other | Admitting: Anesthesiology

## 2021-08-03 ENCOUNTER — Observation Stay (HOSPITAL_COMMUNITY): Payer: Medicare Other

## 2021-08-03 ENCOUNTER — Encounter (HOSPITAL_COMMUNITY): Payer: Self-pay | Admitting: Orthopedic Surgery

## 2021-08-03 ENCOUNTER — Observation Stay (HOSPITAL_COMMUNITY)
Admission: RE | Admit: 2021-08-03 | Discharge: 2021-08-04 | Disposition: A | Discharge status: Home / Self Care | Payer: Medicare Other | Source: Ambulatory Visit | Attending: Orthopedic Surgery | Admitting: Orthopedic Surgery

## 2021-08-03 ENCOUNTER — Other Ambulatory Visit: Payer: Self-pay

## 2021-08-03 ENCOUNTER — Ambulatory Visit (HOSPITAL_COMMUNITY): Payer: Medicare Other

## 2021-08-03 ENCOUNTER — Encounter (HOSPITAL_COMMUNITY)
Admission: RE | Disposition: A | Discharge status: Home / Self Care | Payer: Self-pay | Source: Ambulatory Visit | Attending: Orthopedic Surgery

## 2021-08-03 DIAGNOSIS — Z96649 Presence of unspecified artificial hip joint: Secondary | ICD-10-CM

## 2021-08-03 DIAGNOSIS — J45909 Unspecified asthma, uncomplicated: Secondary | ICD-10-CM | POA: Diagnosis not present

## 2021-08-03 DIAGNOSIS — Z85828 Personal history of other malignant neoplasm of skin: Secondary | ICD-10-CM | POA: Diagnosis not present

## 2021-08-03 DIAGNOSIS — Z79899 Other long term (current) drug therapy: Secondary | ICD-10-CM | POA: Insufficient documentation

## 2021-08-03 DIAGNOSIS — Z96641 Presence of right artificial hip joint: Secondary | ICD-10-CM | POA: Diagnosis not present

## 2021-08-03 DIAGNOSIS — M1611 Unilateral primary osteoarthritis, right hip: Principal | ICD-10-CM | POA: Insufficient documentation

## 2021-08-03 DIAGNOSIS — Z01818 Encounter for other preprocedural examination: Secondary | ICD-10-CM

## 2021-08-03 DIAGNOSIS — I10 Essential (primary) hypertension: Secondary | ICD-10-CM | POA: Insufficient documentation

## 2021-08-03 DIAGNOSIS — Z471 Aftercare following joint replacement surgery: Secondary | ICD-10-CM | POA: Diagnosis not present

## 2021-08-03 DIAGNOSIS — Z419 Encounter for procedure for purposes other than remedying health state, unspecified: Secondary | ICD-10-CM

## 2021-08-03 DIAGNOSIS — Z96642 Presence of left artificial hip joint: Secondary | ICD-10-CM | POA: Diagnosis not present

## 2021-08-03 HISTORY — PX: TOTAL HIP ARTHROPLASTY: SHX124

## 2021-08-03 LAB — GLUCOSE, CAPILLARY
Glucose-Capillary: 145 mg/dL — ABNORMAL HIGH (ref 70–99)
Glucose-Capillary: 96 mg/dL (ref 70–99)

## 2021-08-03 LAB — TYPE AND SCREEN
ABO/RH(D): A POS
Antibody Screen: NEGATIVE

## 2021-08-03 SURGERY — ARTHROPLASTY, HIP, TOTAL, ANTERIOR APPROACH
Anesthesia: Spinal | Site: Hip | Laterality: Right

## 2021-08-03 MED ORDER — MORPHINE SULFATE (PF) 2 MG/ML IV SOLN
0.5000 mg | INTRAVENOUS | Status: DC | PRN
  Administered 2021-08-03: 13:00:00 1 mg via INTRAVENOUS
  Filled 2021-08-03: qty 1

## 2021-08-03 MED ORDER — FENTANYL CITRATE PF 50 MCG/ML IJ SOSY
PREFILLED_SYRINGE | INTRAMUSCULAR | Status: AC
  Filled 2021-08-03: qty 3

## 2021-08-03 MED ORDER — VALACYCLOVIR HCL 500 MG PO TABS
2000.0000 mg | ORAL_TABLET | Freq: Two times a day (BID) | ORAL | Status: DC | PRN
  Filled 2021-08-03: qty 4

## 2021-08-03 MED ORDER — POVIDONE-IODINE 10 % EX SWAB
2.0000 "application " | Freq: Once | CUTANEOUS | Status: AC
  Administered 2021-08-03: 2 via TOPICAL

## 2021-08-03 MED ORDER — POLYETHYLENE GLYCOL 3350 17 G PO PACK: 17.0000 g | PACK | Freq: Every day | ORAL | Status: DC | PRN

## 2021-08-03 MED ORDER — ORAL CARE MOUTH RINSE: 15.0000 mL | Freq: Once | OROMUCOSAL | Status: AC

## 2021-08-03 MED ORDER — SERTRALINE HCL 50 MG PO TABS
150.0000 mg | ORAL_TABLET | Freq: Every day | ORAL | Status: DC
  Administered 2021-08-04: 10:00:00 150 mg via ORAL
  Filled 2021-08-03: qty 1

## 2021-08-03 MED ORDER — HYDROCODONE-ACETAMINOPHEN 5-325 MG PO TABS
1.0000 | ORAL_TABLET | ORAL | Status: DC | PRN
  Administered 2021-08-03 (×2): 2 via ORAL
  Administered 2021-08-04 (×2): 1 via ORAL
  Filled 2021-08-03: qty 1
  Filled 2021-08-03 (×2): qty 2
  Filled 2021-08-03 (×2): qty 1

## 2021-08-03 MED ORDER — ATORVASTATIN CALCIUM 10 MG PO TABS
10.0000 mg | ORAL_TABLET | Freq: Every day | ORAL | Status: DC
  Administered 2021-08-03: 14:00:00 10 mg via ORAL
  Filled 2021-08-03: qty 1

## 2021-08-03 MED ORDER — FENTANYL CITRATE (PF) 100 MCG/2ML IJ SOLN
INTRAMUSCULAR | Status: AC
  Filled 2021-08-03: qty 2

## 2021-08-03 MED ORDER — KETOTIFEN FUMARATE 0.025 % OP SOLN
1.0000 [drp] | Freq: Every day | OPHTHALMIC | Status: DC | PRN
  Filled 2021-08-03: qty 5

## 2021-08-03 MED ORDER — DEXAMETHASONE SODIUM PHOSPHATE 10 MG/ML IJ SOLN: 8.0000 mg | Freq: Once | INTRAMUSCULAR | Status: DC

## 2021-08-03 MED ORDER — MIDAZOLAM HCL 2 MG/2ML IJ SOLN
INTRAMUSCULAR | Status: AC
  Filled 2021-08-03: qty 2

## 2021-08-03 MED ORDER — DEXAMETHASONE SODIUM PHOSPHATE 10 MG/ML IJ SOLN
10.0000 mg | Freq: Once | INTRAMUSCULAR | Status: AC
  Administered 2021-08-04: 10:00:00 10 mg via INTRAVENOUS
  Filled 2021-08-03: qty 1

## 2021-08-03 MED ORDER — LACTATED RINGERS IV SOLN: INTRAVENOUS | Status: DC

## 2021-08-03 MED ORDER — PANTOPRAZOLE SODIUM 40 MG PO TBEC
40.0000 mg | DELAYED_RELEASE_TABLET | Freq: Every day | ORAL | Status: DC
  Administered 2021-08-03 – 2021-08-04 (×2): 40 mg via ORAL
  Filled 2021-08-03 (×2): qty 1

## 2021-08-03 MED ORDER — LIDOCAINE HCL (CARDIAC) PF 100 MG/5ML IV SOSY
PREFILLED_SYRINGE | INTRAVENOUS | Status: DC | PRN
  Administered 2021-08-03: 40 mg via INTRAVENOUS

## 2021-08-03 MED ORDER — MIDAZOLAM HCL 2 MG/2ML IJ SOLN
INTRAMUSCULAR | Status: DC | PRN
  Administered 2021-08-03: 2 mg via INTRAVENOUS

## 2021-08-03 MED ORDER — POLYETHYL GLYCOL-PROPYL GLYCOL 0.4-0.3 % OP GEL
Freq: Every day | OPHTHALMIC | Status: DC | PRN
  Filled 2021-08-03: qty 10

## 2021-08-03 MED ORDER — METOCLOPRAMIDE HCL 5 MG PO TABS
5.0000 mg | ORAL_TABLET | Freq: Three times a day (TID) | ORAL | Status: DC | PRN
  Filled 2021-08-03: qty 2

## 2021-08-03 MED ORDER — SODIUM CHLORIDE 0.9 % IV SOLN: INTRAVENOUS | Status: DC

## 2021-08-03 MED ORDER — DOCUSATE SODIUM 100 MG PO CAPS
100.0000 mg | ORAL_CAPSULE | Freq: Two times a day (BID) | ORAL | Status: DC
  Administered 2021-08-03 – 2021-08-04 (×2): 100 mg via ORAL
  Filled 2021-08-03 (×2): qty 1

## 2021-08-03 MED ORDER — STERILE WATER FOR IRRIGATION IR SOLN
Status: DC | PRN
  Administered 2021-08-03: 2000 mL

## 2021-08-03 MED ORDER — ZOLPIDEM TARTRATE 5 MG PO TABS: 2.5000 mg | ORAL_TABLET | Freq: Every evening | ORAL | Status: DC | PRN

## 2021-08-03 MED ORDER — OXYCODONE HCL 5 MG PO TABS: 5.0000 mg | ORAL_TABLET | Freq: Once | ORAL | Status: DC | PRN

## 2021-08-03 MED ORDER — PHENYLEPHRINE HCL-NACL 20-0.9 MG/250ML-% IV SOLN
INTRAVENOUS | Status: AC
  Filled 2021-08-03: qty 250

## 2021-08-03 MED ORDER — METHOCARBAMOL 500 MG IVPB - SIMPLE MED
INTRAVENOUS | Status: AC
  Filled 2021-08-03: qty 50

## 2021-08-03 MED ORDER — ONDANSETRON HCL 4 MG/2ML IJ SOLN: 4.0000 mg | Freq: Once | INTRAMUSCULAR | Status: DC | PRN

## 2021-08-03 MED ORDER — FLUTICASONE PROPIONATE 50 MCG/ACT NA SUSP
2.0000 | Freq: Two times a day (BID) | NASAL | Status: DC
  Administered 2021-08-04: 10:00:00 2 via NASAL
  Filled 2021-08-03: qty 16

## 2021-08-03 MED ORDER — MONTELUKAST SODIUM 10 MG PO TABS
10.0000 mg | ORAL_TABLET | ORAL | Status: DC
  Administered 2021-08-03: 17:00:00 10 mg via ORAL
  Filled 2021-08-03: qty 1

## 2021-08-03 MED ORDER — FERROUS SULFATE 325 (65 FE) MG PO TABS
325.0000 mg | ORAL_TABLET | Freq: Three times a day (TID) | ORAL | Status: DC
  Administered 2021-08-04 (×2): 325 mg via ORAL
  Filled 2021-08-03 (×2): qty 1

## 2021-08-03 MED ORDER — ONDANSETRON HCL 4 MG/2ML IJ SOLN
4.0000 mg | Freq: Four times a day (QID) | INTRAMUSCULAR | Status: DC | PRN
  Administered 2021-08-03: 13:00:00 4 mg via INTRAVENOUS
  Filled 2021-08-03: qty 2

## 2021-08-03 MED ORDER — ONDANSETRON HCL 4 MG/2ML IJ SOLN
INTRAMUSCULAR | Status: DC | PRN
  Administered 2021-08-03: 4 mg via INTRAVENOUS

## 2021-08-03 MED ORDER — PROPOFOL 500 MG/50ML IV EMUL
INTRAVENOUS | Status: DC | PRN
  Administered 2021-08-03: 80 ug/kg/min via INTRAVENOUS

## 2021-08-03 MED ORDER — PHENYLEPHRINE HCL (PRESSORS) 10 MG/ML IV SOLN
INTRAVENOUS | Status: AC
  Filled 2021-08-03: qty 1

## 2021-08-03 MED ORDER — CHLORHEXIDINE GLUCONATE 0.12 % MT SOLN
15.0000 mL | Freq: Once | OROMUCOSAL | Status: AC
  Administered 2021-08-03: 07:00:00 15 mL via OROMUCOSAL

## 2021-08-03 MED ORDER — MELOXICAM 15 MG PO TABS
15.0000 mg | ORAL_TABLET | Freq: Every day | ORAL | Status: DC
  Administered 2021-08-03 – 2021-08-04 (×2): 15 mg via ORAL
  Filled 2021-08-03 (×2): qty 1

## 2021-08-03 MED ORDER — METOCLOPRAMIDE HCL 5 MG/ML IJ SOLN
5.0000 mg | Freq: Three times a day (TID) | INTRAMUSCULAR | Status: DC | PRN
  Administered 2021-08-03: 14:00:00 10 mg via INTRAVENOUS
  Filled 2021-08-03: qty 2

## 2021-08-03 MED ORDER — HYDROMORPHONE HCL 1 MG/ML IJ SOLN
0.5000 mg | INTRAMUSCULAR | Status: DC | PRN
Start: 2021-08-03 — End: 2021-08-04
  Administered 2021-08-03: 17:00:00 0.5 mg via INTRAVENOUS
  Filled 2021-08-03: qty 1

## 2021-08-03 MED ORDER — PHENYLEPHRINE 40 MCG/ML (10ML) SYRINGE FOR IV PUSH (FOR BLOOD PRESSURE SUPPORT)
PREFILLED_SYRINGE | INTRAVENOUS | Status: DC | PRN
  Administered 2021-08-03 (×2): 80 ug via INTRAVENOUS

## 2021-08-03 MED ORDER — ACETAMINOPHEN 325 MG PO TABS
325.0000 mg | ORAL_TABLET | Freq: Four times a day (QID) | ORAL | Status: DC | PRN
  Administered 2021-08-04: 10:00:00 650 mg via ORAL
  Filled 2021-08-03: qty 2

## 2021-08-03 MED ORDER — MENTHOL 3 MG MT LOZG: 1.0000 | LOZENGE | OROMUCOSAL | Status: DC | PRN

## 2021-08-03 MED ORDER — ACETAMINOPHEN 500 MG PO TABS
1000.0000 mg | ORAL_TABLET | Freq: Once | ORAL | Status: AC
  Administered 2021-08-03: 07:00:00 1000 mg via ORAL
  Filled 2021-08-03: qty 2

## 2021-08-03 MED ORDER — FENTANYL CITRATE PF 50 MCG/ML IJ SOSY
25.0000 ug | PREFILLED_SYRINGE | INTRAMUSCULAR | Status: DC | PRN
  Administered 2021-08-03 (×2): 50 ug via INTRAVENOUS

## 2021-08-03 MED ORDER — PHENOL 1.4 % MT LIQD: 1.0000 | OROMUCOSAL | Status: DC | PRN

## 2021-08-03 MED ORDER — METHOCARBAMOL 500 MG IVPB - SIMPLE MED
500.0000 mg | Freq: Four times a day (QID) | INTRAVENOUS | Status: DC | PRN
  Administered 2021-08-03: 12:00:00 500 mg via INTRAVENOUS
  Filled 2021-08-03: qty 50

## 2021-08-03 MED ORDER — IPRATROPIUM BROMIDE 0.06 % NA SOLN
2.0000 | Freq: Three times a day (TID) | NASAL | Status: DC
  Administered 2021-08-03 – 2021-08-04 (×3): 2 via NASAL
  Filled 2021-08-03: qty 15

## 2021-08-03 MED ORDER — DIPHENHYDRAMINE HCL 12.5 MG/5ML PO ELIX: 12.5000 mg | ORAL_SOLUTION | ORAL | Status: DC | PRN

## 2021-08-03 MED ORDER — ASPIRIN 81 MG PO CHEW
81.0000 mg | CHEWABLE_TABLET | Freq: Two times a day (BID) | ORAL | Status: DC
  Administered 2021-08-03 – 2021-08-04 (×2): 81 mg via ORAL
  Filled 2021-08-03 (×2): qty 1

## 2021-08-03 MED ORDER — TRANEXAMIC ACID-NACL 1000-0.7 MG/100ML-% IV SOLN
1000.0000 mg | INTRAVENOUS | Status: AC
  Administered 2021-08-03: 10:00:00 1000 mg via INTRAVENOUS
  Filled 2021-08-03: qty 100

## 2021-08-03 MED ORDER — ONDANSETRON HCL 4 MG PO TABS
4.0000 mg | ORAL_TABLET | Freq: Four times a day (QID) | ORAL | Status: DC | PRN
  Filled 2021-08-03: qty 1

## 2021-08-03 MED ORDER — PHENYLEPHRINE HCL-NACL 20-0.9 MG/250ML-% IV SOLN
INTRAVENOUS | Status: DC | PRN
  Administered 2021-08-03: 25 ug/min via INTRAVENOUS

## 2021-08-03 MED ORDER — FENTANYL CITRATE (PF) 100 MCG/2ML IJ SOLN
INTRAMUSCULAR | Status: DC | PRN
  Administered 2021-08-03: 25 ug via INTRAVENOUS

## 2021-08-03 MED ORDER — 0.9 % SODIUM CHLORIDE (POUR BTL) OPTIME
TOPICAL | Status: DC | PRN
  Administered 2021-08-03: 10:00:00 1000 mL

## 2021-08-03 MED ORDER — EPINEPHRINE 0.3 MG/0.3ML IJ SOAJ
0.3000 mg | INTRAMUSCULAR | Status: DC | PRN
  Filled 2021-08-03: qty 0.6

## 2021-08-03 MED ORDER — CEFAZOLIN SODIUM-DEXTROSE 2-4 GM/100ML-% IV SOLN
2.0000 g | Freq: Four times a day (QID) | INTRAVENOUS | Status: AC
  Administered 2021-08-03 (×2): 2 g via INTRAVENOUS
  Filled 2021-08-03 (×2): qty 100

## 2021-08-03 MED ORDER — CETIRIZINE HCL 10 MG PO TABS
10.0000 mg | ORAL_TABLET | ORAL | Status: DC
  Administered 2021-08-04: 10:00:00 10 mg via ORAL
  Filled 2021-08-03: qty 1

## 2021-08-03 MED ORDER — PHENYLEPHRINE 40 MCG/ML (10ML) SYRINGE FOR IV PUSH (FOR BLOOD PRESSURE SUPPORT)
PREFILLED_SYRINGE | INTRAVENOUS | Status: AC
  Filled 2021-08-03: qty 10

## 2021-08-03 MED ORDER — OXYCODONE HCL 5 MG/5ML PO SOLN: 5.0000 mg | Freq: Once | ORAL | Status: DC | PRN

## 2021-08-03 MED ORDER — CEFAZOLIN SODIUM-DEXTROSE 2-4 GM/100ML-% IV SOLN
2.0000 g | INTRAVENOUS | Status: AC
  Administered 2021-08-03: 10:00:00 2 g via INTRAVENOUS
  Filled 2021-08-03: qty 100

## 2021-08-03 MED ORDER — METHOCARBAMOL 500 MG PO TABS
500.0000 mg | ORAL_TABLET | Freq: Four times a day (QID) | ORAL | Status: DC | PRN
  Administered 2021-08-04: 10:00:00 500 mg via ORAL
  Filled 2021-08-03: qty 1

## 2021-08-03 MED ORDER — BUPIVACAINE IN DEXTROSE 0.75-8.25 % IT SOLN
INTRATHECAL | Status: DC | PRN
  Administered 2021-08-03: 1.6 mL via INTRATHECAL

## 2021-08-03 MED ORDER — PROPOFOL 10 MG/ML IV BOLUS
INTRAVENOUS | Status: DC | PRN
  Administered 2021-08-03 (×4): 10 mg via INTRAVENOUS
  Administered 2021-08-03 (×2): 20 mg via INTRAVENOUS

## 2021-08-03 MED ORDER — TRANEXAMIC ACID-NACL 1000-0.7 MG/100ML-% IV SOLN
1000.0000 mg | Freq: Once | INTRAVENOUS | Status: AC
  Administered 2021-08-03: 14:00:00 1000 mg via INTRAVENOUS
  Filled 2021-08-03: qty 100

## 2021-08-03 MED ORDER — BISACODYL 10 MG RE SUPP: 10.0000 mg | Freq: Every day | RECTAL | Status: DC | PRN

## 2021-08-03 MED ORDER — ALBUTEROL SULFATE HFA 108 (90 BASE) MCG/ACT IN AERS: 2.0000 | INHALATION_SPRAY | Freq: Four times a day (QID) | RESPIRATORY_TRACT | Status: DC | PRN

## 2021-08-03 SURGICAL SUPPLY — 43 items
ADH SKN CLS APL DERMABOND .7 (GAUZE/BANDAGES/DRESSINGS) ×1
BAG COUNTER SPONGE SURGICOUNT (BAG) ×1 IMPLANT
BAG DECANTER FOR FLEXI CONT (MISCELLANEOUS) IMPLANT
BAG SPEC THK2 15X12 ZIP CLS (MISCELLANEOUS)
BAG SPNG CNTER NS LX DISP (BAG) ×1
BAG SURGICOUNT SPONGE COUNTING (BAG) ×1
BAG ZIPLOCK 12X15 (MISCELLANEOUS) IMPLANT
BLADE SAG 18X100X1.27 (BLADE) ×4 IMPLANT
COVER PERINEAL POST (MISCELLANEOUS) ×4 IMPLANT
COVER SURGICAL LIGHT HANDLE (MISCELLANEOUS) ×4 IMPLANT
CUP ACETBLR 54 OD PINNACLE (Hips) ×2 IMPLANT
DERMABOND ADVANCED (GAUZE/BANDAGES/DRESSINGS) ×2
DERMABOND ADVANCED .7 DNX12 (GAUZE/BANDAGES/DRESSINGS) ×2 IMPLANT
DRAPE FOOT SWITCH (DRAPES) ×4 IMPLANT
DRAPE STERI IOBAN 125X83 (DRAPES) ×4 IMPLANT
DRAPE U-SHAPE 47X51 STRL (DRAPES) ×8 IMPLANT
DRESSING AQUACEL AG SP 3.5X10 (GAUZE/BANDAGES/DRESSINGS) ×2 IMPLANT
DRSG AQUACEL AG SP 3.5X10 (GAUZE/BANDAGES/DRESSINGS) ×3
DURAPREP 26ML APPLICATOR (WOUND CARE) ×4 IMPLANT
ELECT REM PT RETURN 15FT ADLT (MISCELLANEOUS) ×4 IMPLANT
ELIMINATOR HOLE APEX DEPUY (Hips) ×2 IMPLANT
GLOVE SURG ENC MOIS LTX SZ6 (GLOVE) ×4 IMPLANT
GLOVE SURG ENC MOIS LTX SZ7 (GLOVE) ×4 IMPLANT
GLOVE SURG UNDER LTX SZ6.5 (GLOVE) ×4 IMPLANT
GLOVE SURG UNDER POLY LF SZ7.5 (GLOVE) ×4 IMPLANT
GOWN STRL REUS W/TWL LRG LVL3 (GOWN DISPOSABLE) ×8 IMPLANT
HEAD CERAMIC DELTA 36 PLUS 1.5 (Hips) ×2 IMPLANT
HOLDER FOLEY CATH W/STRAP (MISCELLANEOUS) ×4 IMPLANT
KIT TURNOVER KIT A (KITS) ×2 IMPLANT
LINER NEUTRAL 54X36MM PLUS 4 (Hips) ×2 IMPLANT
PACK ANTERIOR HIP CUSTOM (KITS) ×4 IMPLANT
SCREW 6.5MMX30MM (Screw) ×2 IMPLANT
SPONGE T-LAP 18X18 ~~LOC~~+RFID (SPONGE) ×10 IMPLANT
STEM FEM ACTIS STD SZ4 (Stem) ×2 IMPLANT
SUT MNCRL AB 4-0 PS2 18 (SUTURE) ×4 IMPLANT
SUT STRATAFIX 0 PDS 27 VIOLET (SUTURE) ×3
SUT VIC AB 1 CT1 36 (SUTURE) ×12 IMPLANT
SUT VIC AB 2-0 CT1 27 (SUTURE) ×6
SUT VIC AB 2-0 CT1 TAPERPNT 27 (SUTURE) ×4 IMPLANT
SUTURE STRATFX 0 PDS 27 VIOLET (SUTURE) ×2 IMPLANT
TRAY FOLEY MTR SLVR 14FR STAT (SET/KITS/TRAYS/PACK) ×2 IMPLANT
TUBE SUCTION HIGH CAP CLEAR NV (SUCTIONS) ×4 IMPLANT
WATER STERILE IRR 1000ML POUR (IV SOLUTION) ×2 IMPLANT

## 2021-08-03 NOTE — TOC Transition Note (Signed)
Transition of Care [facility]) - CM/SW Discharge Note   Patient Details  Name: Traci Hudson MRN: [id number] Date of Birth: 1956-03-17  [facility] CM/SW Contact:  Lennart Pall, LCSW Phone Number: 08/03/2021, 2:04 PM   Clinical Narrative:    Met with pt today and confirming she has all needed DME at home.  Plan for HEP.  No TOC needs.   Final next level of care: Home/Self Care Barriers to Discharge: No Barriers Identified   Patient Goals and CMS Choice Patient states their goals for this hospitalization and ongoing recovery are:: return home      Discharge Placement                       Discharge Plan and Services                DME Arranged: N/A DME Agency: NA                  Social Determinants of Health (SDOH) Interventions     Readmission Risk Interventions No flowsheet data found.

## 2021-08-03 NOTE — Plan of Care (Signed)

## 2021-08-03 NOTE — Anesthesia Procedure Notes (Signed)
Spinal  Patient location during procedure: OR Start time: 08/03/2021 9:31 AM End time: 08/03/2021 9:34 AM Staffing Performed: resident/CRNA  Anesthesiologist: Audry Pili, MD Resident/CRNA: Raenette Rover, CRNA Preanesthetic Checklist Completed: patient identified, IV checked, site marked, risks and benefits discussed, surgical consent, monitors and equipment checked, pre-op evaluation and timeout performed Spinal Block Patient position: sitting Prep: DuraPrep Patient monitoring: heart rate, continuous pulse ox and blood pressure Approach: midline Location: L3-4 Injection technique: single-shot Needle Needle type: Pencan  Needle gauge: 24 G Assessment Sensory level: T6 Events: CSF return

## 2021-08-03 NOTE — Evaluation (Signed)
Physical Therapy Evaluation Patient Details Name: Traci Hudson MRN: [id number] DOB: 1955-09-28 Today's Date: 08/03/2021  History of Present Illness  65 yo female s/p R DA THA. PMH: L DA THA, HTN, SBO  Clinical Impression  Pt is s/p THA resulting in the deficits listed below (see PT Problem List).  Pt amb short distance in room, limited only by N/V.  Anticipate excellent progress once nausea resolved.   Pt will benefit from skilled PT to increase their independence and safety with mobility to allow discharge to the venue listed below.         Recommendations for follow up therapy are one component of a multi-disciplinary discharge planning process, led by the attending physician.  Recommendations may be updated based on patient status, additional functional criteria and insurance authorization.  Follow Up Recommendations Follow physician's recommendations for discharge plan and follow up therapies    Assistance Recommended at Discharge Intermittent Supervision/Assistance  Functional Status Assessment Patient has had a recent decline in their functional status and demonstrates the ability to make significant improvements in function in a reasonable and predictable amount of time.  Equipment Recommendations  None recommended by PT    Recommendations for Other Services       Precautions / Restrictions Precautions Precautions: Fall Restrictions Weight Bearing Restrictions: No RLE Weight Bearing: Weight bearing as tolerated      Mobility  Bed Mobility Overal bed mobility: Needs Assistance Bed Mobility: Supine to Sit     Supine to sit: Min guard     General bed mobility comments: to lower RLE, incr time    Transfers Overall transfer level: Needs assistance Equipment used: Rolling walker (2 wheels) Transfers: Sit to/from Stand Sit to Stand: Min assist;Min guard           General transfer comment: cues for hand placement    Ambulation/Gait Ambulation/Gait  assistance: Min guard;Min assist Gait Distance (Feet): 6 Feet Assistive device: Rolling walker (2 wheels) Gait Pattern/deviations: Step-to pattern       General Gait Details: cues for sequence. limited by nausea  Stairs            Wheelchair Mobility    Modified Rankin (Stroke Patients Only)       Balance                                             Pertinent Vitals/Pain Pain Assessment: 0-10 Pain Score: 5  Pain Location: right hip Pain Descriptors / Indicators: Sore Pain Intervention(s): Limited activity within patient's tolerance;Monitored during session;Premedicated before session;Repositioned    Home Living Family/patient expects to be discharged to:: Private residence Living Arrangements: Alone Available Help at Discharge: Family;Available 24 hours/day Type of Home: House Home Access: Level entry       Home Layout: Two level;Able to live on main level with bedroom/bathroom Home Equipment: Conservation officer, nature (2 wheels);BSC/3in1      Prior Function Prior Level of Function : Independent/Modified Independent                     Hand Dominance        Extremity/Trunk Assessment   Upper Extremity Assessment Upper Extremity Assessment: Defer to OT evaluation    Lower Extremity Assessment Lower Extremity Assessment: RLE deficits/detail RLE Deficits / Details: grossly 2+/5, AAROM WFL RLE: Unable to fully assess due to pain  Communication   Communication: No difficulties  Cognition Arousal/Alertness: Awake/alert Behavior During Therapy: WFL for tasks assessed/performed Overall Cognitive Status: Within Functional Limits for tasks assessed                                          General Comments      Exercises Total Joint Exercises Ankle Circles/Pumps: AROM;Both;10 reps   Assessment/Plan    PT Assessment Patient needs continued PT services  PT Problem List Decreased strength;Decreased  mobility;Decreased range of motion;Decreased activity tolerance;Pain;Decreased knowledge of use of DME       PT Treatment Interventions DME instruction;Therapeutic activities;Gait training;Functional mobility training;Therapeutic exercise;Patient/family education    PT Goals (Current goals can be found in the Care Plan section)  Acute Rehab PT Goals Patient Stated Goal: home PT Goal Formulation: With patient Time For Goal Achievement: 08/10/21 Potential to Achieve Goals: Good    Frequency 7X/week   Barriers to discharge        Co-evaluation               AM-PAC PT "6 Clicks" Mobility  Outcome Measure Help needed turning from your back to your side while in a flat bed without using bedrails?: A Little Help needed moving from lying on your back to sitting on the side of a flat bed without using bedrails?: A Little Help needed moving to and from a bed to a chair (including a wheelchair)?: A Little Help needed standing up from a chair using your arms (e.g., wheelchair or bedside chair)?: A Little Help needed to walk in hospital room?: A Little Help needed climbing 3-5 steps with a railing? : A Lot 6 Click Score: 17    End of Session Equipment Utilized During Treatment: Gait belt Activity Tolerance: Patient tolerated treatment well;Other (comment);Treatment limited secondary to medical complications (Comment) (nausea) Patient left: in chair;with chair alarm set;with call bell/phone within reach Nurse Communication: Mobility status PT Visit Diagnosis: Other abnormalities of gait and mobility (R26.89);Difficulty in walking, not elsewhere classified (R26.2)    Time: 1610-9604 PT Time Calculation (min) (ACUTE ONLY): 16 min   Charges:   PT Evaluation $PT Eval Low Complexity: [place], PT  Acute Rehab Dept ([place]) [phone] Pager [phone]  08/03/2021   [facility] 08/03/2021, 3:58 PM

## 2021-08-03 NOTE — Care Plan (Signed)
Ortho Bundle Case Management Note  Patient Details  Name: Traci Hudson MRN: [id number] Date of Birth: [birth year]  R THA on 08-03-21 DCP:  Home with friends.  DME:  No needs. PT:  HEP                   DME Arranged:  N/A DME Agency:  NA  HH Arranged:  NA HH Agency:  NA  Additional Comments: Please contact me with any questions of if this plan should need to change.  Marianne Sofia, RN,CCM [facility]  [phone] 08/03/2021, 4:00 PM

## 2021-08-03 NOTE — Op Note (Signed)
NAME:  RICHELL Hudson.: [id number]      MEDICAL RECORD NO.: [id number]      FACILITY:  [facility]      PHYSICIAN:  [name]  DATE OF BIRTH:  11/11/1955     DATE OF PROCEDURE:  08/03/2021                                 OPERATIVE REPORT         PREOPERATIVE DIAGNOSIS: Right  hip osteoarthritis.      POSTOPERATIVE DIAGNOSIS:  Right hip osteoarthritis.      PROCEDURE:  Right total hip replacement through an anterior approach   utilizing DePuy THR system, component size 54 mm pinnacle cup, a size 36+4 neutral   Altrex liner, a size 4 standard Actis stem with a 36+1.5  delta ceramic   ball.      SURGEON:  Pietro Cassis. Alvan Dame, M.D.      ASSISTANT:  Costella Hatcher, PA-C     ANESTHESIA:  Spinal.      SPECIMENS:  None.      COMPLICATIONS:  None.      BLOOD LOSS:  250 cc     DRAINS:  None.      INDICATION OF THE PROCEDURE:  Traci Hudson is a 65 y.o. female who had   presented to office for evaluation of right hip pain.  Radiographs revealed   progressive degenerative changes with bone-on-bone   articulation of the  hip joint, including subchondral cystic changes and osteophytes.  The patient had painful limited range of   motion significantly affecting their overall quality of life and function.  The patient was failing to    respond to conservative measures including medications and/or injections and activity modification and at this point was ready   to proceed with more definitive measures.  Consent was obtained for   benefit of pain relief.  Specific risks of infection, DVT, component   failure, dislocation, neurovascular injury, and need for revision surgery were reviewed in the office as well discussion of   the anterior versus posterior approach were reviewed.     PROCEDURE IN DETAIL:  The patient was brought to operative theater.   Once adequate anesthesia, preoperative antibiotics, 2 gm of Ancef, 1 gm of Tranexamic  Acid, and 10 mg of Decadron were administered, the patient was positioned supine on the [employer] table.  Once the patient was safely positioned with adequate padding of boney prominences we predraped out the hip, and used fluoroscopy to confirm orientation of the pelvis.      The right hip was then prepped and draped from proximal iliac crest to   mid thigh with a shower curtain technique.      Time-out was performed identifying the patient, planned procedure, and the appropriate extremity.     An incision was then made 2 cm lateral to the   anterior superior iliac spine extending over the orientation of the   tensor fascia lata muscle and sharp dissection was carried down to the   fascia of the muscle.      The fascia was then incised.  The muscle belly was identified and swept   laterally and retractor placed along the superior neck.  Following   cauterization of the circumflex vessels and removing  some pericapsular   fat, a second cobra retractor was placed on the inferior neck.  A T-capsulotomy was made along the line of the   superior neck to the trochanteric fossa, then extended proximally and   distally.  Tag sutures were placed and the retractors were then placed   intracapsular.  We then identified the trochanteric fossa and   orientation of my neck cut and then made a neck osteotomy with the femur on traction.  The femoral   head was removed without difficulty or complication.  Traction was let   off and retractors were placed posterior and anterior around the   acetabulum.      The labrum and foveal tissue were debrided.  I began reaming with a 45 mm   reamer and reamed up to 53 mm reamer with good bony bed preparation and a 54 mm  cup was chosen.  The final 54 mm Pinnacle cup was then impacted under fluoroscopy to confirm the depth of penetration and orientation with respect to   Abduction and forward flexion.  A screw was placed into the ilium followed by the hole eliminator.   The final   36+4 neutral Altrex liner was impacted with good visualized rim fit.  The cup was positioned anatomically within the acetabular portion of the pelvis.      At this point, the femur was rolled to 100 degrees.  Further capsule was   released off the inferior aspect of the femoral neck.  I then   released the superior capsule proximally.  With the leg in a neutral position the hook was placed laterally   along the femur under the vastus lateralis origin and elevated manually and then held in position using the hook attachment on the bed.  The leg was then extended and adducted with the leg rolled to 100   degrees of external rotation.  Retractors were placed along the medial calcar and posteriorly over the greater trochanter.  Once the proximal femur was fully   exposed, I used a box osteotome to set orientation.  I then began   broaching with the starting chili pepper broach and passed this by hand and then broached up to 4.  With the 4 broach in place I chose a standard offset neck and did several trial reductions.  The offset was appropriate, leg lengths   appeared to be equal best matched to her other previously replaced left hip with the +1.5 head ball trial confirmed radiographically.   Given these findings, I went ahead and dislocated the hip, repositioned all   retractors and positioned the right hip in the extended and abducted position.  The final 4 standard Actis stem was   chosen and it was impacted down to the level of neck cut.  Based on this   and the trial reductions, a final 36+1.5 delta ceramic ball was chosen and   impacted onto a clean and dry trunnion, and the hip was reduced.  The   hip had been irrigated throughout the case again at this point.  I did   reapproximate the superior capsular leaflet to the anterior leaflet   using #1 Vicryl.  The fascia of the   tensor fascia lata muscle was then reapproximated using #1 Vicryl and #0 Stratafix sutures.  The    remaining wound was closed with 2-0 Vicryl and running 4-0 Monocryl.   The hip was cleaned, dried, and dressed sterilely using Dermabond and   Aquacel dressing.  The patient was then brought   to recovery room in stable condition tolerating the procedure well.    Costella Hatcher, PA-C was present for the entirety of the case involved from   preoperative positioning, perioperative retractor management, general   facilitation of the case, as well as primary wound closure as assistant.            Pietro Cassis Alvan Dame, M.D.        08/03/2021 11:02 AM

## 2021-08-03 NOTE — Transfer of Care (Signed)
Immediate Anesthesia Transfer of Care Note  Patient: Traci Hudson  Procedure(s) Performed: TOTAL HIP ARTHROPLASTY ANTERIOR APPROACH (Right: Hip)  Patient Location: PACU  Anesthesia Type:Spinal  Level of Consciousness: awake, alert  and patient cooperative  Airway & Oxygen Therapy: Patient Spontanous Breathing and Patient connected to face mask oxygen  Post-op Assessment: Report given to RN and Post -op Vital signs reviewed and stable  Post vital signs: Reviewed and stable  Last Vitals:  Vitals Value Taken Time  BP 112/80 08/03/21 1110  Temp    Pulse 75 08/03/21 1111  Resp 14 08/03/21 1111  SpO2 100 % 08/03/21 1111  Vitals shown include unvalidated device data.  Last Pain:  Vitals:   08/03/21 0653  TempSrc: Oral  PainSc:       Patients Stated Pain Goal: 4 (32/02/33 4356)  Complications: No notable events documented.

## 2021-08-03 NOTE — Discharge Instructions (Signed)

## 2021-08-03 NOTE — Anesthesia Postprocedure Evaluation (Signed)
Anesthesia Post Note  Patient: Traci Hudson  Procedure(s) Performed: TOTAL HIP ARTHROPLASTY ANTERIOR APPROACH (Right: Hip)     Patient location during evaluation: PACU Anesthesia Type: Spinal Level of consciousness: awake and alert Pain management: pain level controlled Vital Signs Assessment: post-procedure vital signs reviewed and stable Respiratory status: spontaneous breathing and respiratory function stable Cardiovascular status: blood pressure returned to baseline and stable Postop Assessment: spinal receding and no apparent nausea or vomiting Anesthetic complications: no   No notable events documented.  Last Vitals:  Vitals:   08/03/21 1215 08/03/21 1237  BP: 108/80 105/74  Pulse: 70 65  Resp: 18 16  Temp: 36.7 C 36.5 C  SpO2: 95% 94%                   Audry Pili

## 2021-08-03 NOTE — Interval H&P Note (Signed)
History and Physical Interval Note:  08/03/2021 7:27 AM  Traci Hudson  has presented today for surgery, with the diagnosis of Right hip osteoarthritis.  The various methods of treatment have been discussed with the patient and family. After consideration of risks, benefits and other options for treatment, the patient has consented to  Procedure(s): TOTAL HIP ARTHROPLASTY ANTERIOR APPROACH (Right) as a surgical intervention.  The patient's history has been reviewed, patient examined, no change in status, stable for surgery.  I have reviewed the patient's chart and labs.  Questions were answered to the patient's satisfaction.     [name]

## 2021-08-04 ENCOUNTER — Encounter (HOSPITAL_COMMUNITY): Payer: Self-pay | Admitting: Orthopedic Surgery

## 2021-08-04 DIAGNOSIS — Z96642 Presence of left artificial hip joint: Secondary | ICD-10-CM | POA: Diagnosis not present

## 2021-08-04 DIAGNOSIS — I10 Essential (primary) hypertension: Secondary | ICD-10-CM | POA: Diagnosis not present

## 2021-08-04 DIAGNOSIS — M1611 Unilateral primary osteoarthritis, right hip: Secondary | ICD-10-CM | POA: Diagnosis not present

## 2021-08-04 DIAGNOSIS — J45909 Unspecified asthma, uncomplicated: Secondary | ICD-10-CM | POA: Diagnosis not present

## 2021-08-04 DIAGNOSIS — Z79899 Other long term (current) drug therapy: Secondary | ICD-10-CM | POA: Diagnosis not present

## 2021-08-04 DIAGNOSIS — Z85828 Personal history of other malignant neoplasm of skin: Secondary | ICD-10-CM | POA: Diagnosis not present

## 2021-08-04 LAB — BASIC METABOLIC PANEL
Anion gap: 6 (ref 5–15)
BUN: 8 mg/dL (ref 8–23)
CO2: 23 mmol/L (ref 22–32)
Calcium: 8.4 mg/dL — ABNORMAL LOW (ref 8.9–10.3)
Chloride: 102 mmol/L (ref 98–111)
Creatinine, Ser: 0.81 mg/dL (ref 0.44–1.00)
GFR, Estimated: >60 mL/min (ref >60)
Glucose, Bld: 145 mg/dL — ABNORMAL HIGH (ref 70–99)
Potassium: 3.8 mmol/L (ref 3.5–5.1)
Sodium: 131 mmol/L — ABNORMAL LOW (ref 135–145)

## 2021-08-04 LAB — CBC
HCT: 34.5 % — ABNORMAL LOW (ref 36.0–46.0)
Hemoglobin: 11.7 g/dL — ABNORMAL LOW (ref 12.0–15.0)
MCH: 28.4 pg (ref 26.0–34.0)
MCHC: 33.9 g/dL (ref 30.0–36.0)
MCV: 83.7 fL (ref 80.0–100.0)
Platelets: 169 10*3/uL (ref 150–400)
RBC: 4.12 MIL/uL (ref 3.87–5.11)
RDW: 12.9 % (ref 11.5–15.5)
WBC: 10.9 10*3/uL — ABNORMAL HIGH (ref 4.0–10.5)
nRBC: 0 % (ref 0.0–0.2)

## 2021-08-04 NOTE — Plan of Care (Signed)
  Problem: Activity: Goal: Ability to avoid complications of mobility impairment will improve Outcome: Progressing   Problem: Clinical Measurements: Goal: Postoperative complications will be avoided or minimized Outcome: Progressing   Problem: Pain Management: Goal: Pain level will decrease with appropriate interventions Outcome: Progressing   Problem: Skin Integrity: Goal: Will show signs of wound healing Outcome: Progressing   

## 2021-08-04 NOTE — Progress Notes (Signed)
° °  Subjective: 1 Day Post-Op Procedure(s) (LRB): TOTAL HIP ARTHROPLASTY ANTERIOR APPROACH (Right) Patient reports pain as mild.   Patient seen in rounds with Dr. Alvan Dame. Patient is resting in bed on exam this morning. Foley catheter removed. She reports she did have significant pain last night, which has since resolved. She was limited by PT yesterday due to N/V.  We will start therapy today.   Objective: Vital signs in last 24 hours: Temp:  [97.4 F (36.3 C)-98.6 F (37 C)] 98.3 F (36.8 C) (12/28 0553) Pulse Rate:  [54-90] 90 (12/28 0553) Resp:  [12-18] 16 (12/28 0553) BP: (105-123)/(65-82) 118/78 (12/28 0553) SpO2:  [93 %-100 %] 94 % (12/28 0553)  Intake/Output from previous day:  Intake/Output Summary (Last 24 hours) at 08/04/2021 0731 Last data filed at 08/04/2021 0600 Gross per 24 hour  Intake 3962.32 ml  Output 1930 ml  Net 2032.32 ml     Intake/Output this shift: No intake/output data recorded.  Labs: Recent Labs    08/04/21 0313  HGB 11.7*   Recent Labs    08/04/21 0313  WBC 10.9*  RBC 4.12  HCT 34.5*  PLT 169   Recent Labs    08/04/21 0313  NA 131*  K 3.8  CL 102  CO2 23  BUN 8  CREATININE 0.81  GLUCOSE 145*  CALCIUM 8.4*   No results for input(s): LABPT, INR in the last 72 hours.  Exam: General - Patient is Alert and Oriented Extremity - Neurologically intact Sensation intact distally Intact pulses distally Dorsiflexion/Plantar flexion intact Dressing - dressing C/D/I Motor Function - intact, moving foot and toes well on exam.   Past Medical History:  Diagnosis Date   Arthritis    Asthma    Depression    Diverticulitis    Dyspnea    With asthma flares   GERD (gastroesophageal reflux disease)    Hypercholesteremia    Hypertension    Insomnia    Pre-diabetes    Skin cancer     Assessment/Plan: 1 Day Post-Op Procedure(s) (LRB): TOTAL HIP ARTHROPLASTY ANTERIOR APPROACH (Right) Principal Problem:   S/P total right hip  arthroplasty  Estimated body mass index is 25.96 kg/m as calculated from the following:   Height as of this encounter: 5\' 5"  (1.651 m).   Weight as of this encounter: 70.8 kg. Advance diet Up with therapy D/C IV fluids  DVT Prophylaxis - Aspirin Weight bearing as tolerated.  Plan is to go Home after hospital stay. She has her post-op meds at home. Follow up in the office in 2 weeks.   Griffith Citron, PA-C Orthopedic Surgery [phone] 08/04/2021, 7:31 AM

## 2021-08-04 NOTE — Progress Notes (Signed)
Physical Therapy Treatment Patient Details Name: Traci Hudson MRN: [id number] DOB: 1956-08-02 Today's Date: 08/04/2021   History of Present Illness 65 yo female s/p R DA THA. PMH: L DA THA, HTN, SBO    PT Comments    Pt progressing well. Incr gait distance. Will see for a second session to review THA HEP/progression of activity and pt is hopeful to d/c early pm with friends assisting at home    Recommendations for follow up therapy are one component of a multi-disciplinary discharge planning process, led by the attending physician.  Recommendations may be updated based on patient status, additional functional criteria and insurance authorization.  Follow Up Recommendations  Follow physician's recommendations for discharge plan and follow up therapies     Assistance Recommended at Discharge Intermittent Supervision/Assistance  Equipment Recommendations  None recommended by PT    Recommendations for Other Services       Precautions / Restrictions Precautions Precautions: Fall Restrictions Weight Bearing Restrictions: No Other Position/Activity Restrictions: WBAT     Mobility  Bed Mobility Overal bed mobility: Needs Assistance Bed Mobility: Supine to Sit;Sit to Supine     Supine to sit: Min guard Sit to supine: Min guard;Min assist   General bed mobility comments: instructed in use of gait belt as leg lifter, min assist to min/guard to lift RLE completely on to bed.    Transfers Overall transfer level: Needs assistance Equipment used: Rolling walker (2 wheels) Transfers: Sit to/from Stand Sit to Stand: Min guard           General transfer comment: cues for hand placement    Ambulation/Gait Ambulation/Gait assistance: Min guard Gait Distance (Feet): 60 Feet Assistive device: Rolling walker (2 wheels) Gait Pattern/deviations: Step-to pattern       General Gait Details: cues for sequence and RW position. some mild dizziness which seems related to meds.  pt did not feels she was going to pass out/dizziness did not worsen with mobility   Stairs             Wheelchair Mobility    Modified Rankin (Stroke Patients Only)       Balance Overall balance assessment: Mild deficits observed, not formally tested                                          Cognition Arousal/Alertness: Awake/alert Behavior During Therapy: WFL for tasks assessed/performed Overall Cognitive Status: Within Functional Limits for tasks assessed                                          Exercises      General Comments        Pertinent Vitals/Pain Pain Assessment: 0-10 Pain Score: 4  Pain Location: right hip Pain Descriptors / Indicators: Sore Pain Intervention(s): Limited activity within patient's tolerance;Monitored during session;Premedicated before session;Ice applied    Home Living                          Prior Function            PT Goals (current goals can now be found in the care plan section) Acute Rehab PT Goals Patient Stated Goal: home PT Goal Formulation: With patient Time For Goal Achievement: 08/10/21 Potential to Achieve  Goals: Good Progress towards PT goals: Progressing toward goals    Frequency    7X/week      PT Plan Current plan remains appropriate    Co-evaluation              AM-PAC PT "6 Clicks" Mobility   Outcome Measure  Help needed turning from your back to your side while in a flat bed without using bedrails?: A Little Help needed moving from lying on your back to sitting on the side of a flat bed without using bedrails?: A Little Help needed moving to and from a bed to a chair (including a wheelchair)?: A Little Help needed standing up from a chair using your arms (e.g., wheelchair or bedside chair)?: A Little Help needed to walk in hospital room?: A Little Help needed climbing 3-5 steps with a railing? : A Lot 6 Click Score: 17    End of Session  Equipment Utilized During Treatment: Gait belt Activity Tolerance: Patient tolerated treatment well Patient left: in bed;with call bell/phone within reach;with bed alarm set Nurse Communication: Mobility status PT Visit Diagnosis: Other abnormalities of gait and mobility (R26.89);Difficulty in walking, not elsewhere classified (R26.2)     Time: 8588-5027 PT Time Calculation (min) (ACUTE ONLY): 19 min  Charges:  $Gait Training: 8-22 mins                     Baxter Flattery, PT  Acute Rehab Dept ([place]) [phone] Pager [phone]  08/04/2021    [facility] 08/04/2021, 10:36 AM

## 2021-08-04 NOTE — Progress Notes (Signed)
Provided discharge education/instructions, all questions and concern addressed. Pt not in acute distress, discharged home with belongings accompanied by friend.

## 2021-08-04 NOTE — Progress Notes (Signed)
°   08/04/21 1140  PT Visit Information  Last PT Received On 08/04/21  Assistance Needed +1  Pt progressing well. Reviewed THA HEP and progression. Pt ready for d/c from PT standpoint with family/friends assist as needed  History of Present Illness 65 yo female s/p R DA THA. PMH: L DA THA, HTN, SBO  Subjective Data  Patient Stated Goal home  Precautions  Precautions Fall  Restrictions  RLE Weight Bearing WBAT  Other Position/Activity Restrictions WBAT  Pain Assessment  Pain Location right hip  Pain Descriptors / Indicators Sore  Cognition  Arousal/Alertness Awake/alert  Behavior During Therapy WFL for tasks assessed/performed  Overall Cognitive Status Within Functional Limits for tasks assessed  Bed Mobility  Overal bed mobility Needs Assistance  Bed Mobility Sit to Supine  Sit to supine Min guard;Supervision  General bed mobility comments instructed in use of gait belt as leg lifter  Transfers  Overall transfer level Needs assistance  Equipment used Rolling walker (2 wheels)  Transfers Sit to/from Stand  Sit to Stand Min guard  General transfer comment cues for hand placement  Ambulation/Gait  Ambulation/Gait assistance Min guard;Supervision  Gait Distance (Feet) 10 Feet  Assistive device Rolling walker (2 wheels)  Gait Pattern/deviations Step-to pattern  General Gait Details cues for sequence and RW position. some mild dizziness which seems related to meds. pt did not feels she was going to pass out/dizziness did not worsen with mobility  Balance  Overall balance assessment Mild deficits observed, not formally tested  Total Joint Exercises  Ankle Circles/Pumps AROM;Both;10 reps  Quad Sets AROM;Both;10 reps  Gluteal Sets AROM;Both;10 reps  Short Arc Quad AROM;Right;10 reps  Heel Slides AAROM;Right;10 reps  Hip ABduction/ADduction AROM;Right;10 reps;AAROM  PT - End of Session  Equipment Utilized During Treatment Gait belt  Activity Tolerance Patient tolerated treatment  well  Patient left in bed;with call bell/phone within reach;with bed alarm set  Nurse Communication Mobility status   PT - Assessment/Plan  PT Plan Current plan remains appropriate  PT Visit Diagnosis Other abnormalities of gait and mobility (R26.89);Difficulty in walking, not elsewhere classified (R26.2)  PT Frequency (ACUTE ONLY) 7X/week  Follow Up Recommendations Follow physician's recommendations for discharge plan and follow up therapies  Assistance recommended at discharge Intermittent Supervision/Assistance  PT equipment None recommended by PT  AM-PAC PT "6 Clicks" Mobility Outcome Measure (Version 2)  Help needed turning from your back to your side while in a flat bed without using bedrails? 3  Help needed moving from lying on your back to sitting on the side of a flat bed without using bedrails? 3  Help needed moving to and from a bed to a chair (including a wheelchair)? 3  Help needed standing up from a chair using your arms (e.g., wheelchair or bedside chair)? 3  Help needed to walk in hospital room? 3  Help needed climbing 3-5 steps with a railing?  2  6 Click Score 17  Consider Recommendation of Discharge To: Home with [facility]  PT Goal Progression  Progress towards PT goals Progressing toward goals  Acute Rehab PT Goals  PT Goal Formulation With patient  Time For Goal Achievement 08/10/21  Potential to Achieve Goals Good  PT Time Calculation  PT Start Time (ACUTE ONLY) 1136  PT Stop Time (ACUTE ONLY) 1155  PT Time Calculation (min) (ACUTE ONLY) 19 min  PT General Charges  $$ ACUTE PT VISIT 1 Visit  PT Treatments  $Therapeutic Exercise 8-22 mins

## 2021-08-12 NOTE — Discharge Summary (Signed)
Patient ID: EDILIA GHUMAN MRN: [id number] DOB/AGE: [birth year] 66 y.o.  Admit date: 08/03/2021 Discharge date: 08/04/2021  Admission Diagnoses:  Right hip osteoarthritis   Discharge Diagnoses:  S/P right total hip arthroplasty   Past Medical History:  Diagnosis Date   Arthritis    Asthma    Depression    Diverticulitis    Dyspnea    With asthma flares   GERD (gastroesophageal reflux disease)    Hypercholesteremia    Hypertension    Insomnia    Pre-diabetes    Skin cancer     Surgeries: Procedure(s): TOTAL HIP ARTHROPLASTY ANTERIOR APPROACH on 08/03/2021   Consultants:   Discharged Condition: Improved  Hospital Course: FUMIYE LUBBEN is an 66 y.o. female who was admitted 08/03/2021 for operative treatment ofS/P total right hip arthroplasty. Patient has severe unremitting pain that affects sleep, daily activities, and work/hobbies. After pre-op clearance the patient was taken to the operating room on 08/03/2021 and underwent  Procedure(s): TOTAL HIP ARTHROPLASTY ANTERIOR APPROACH.    Patient was given perioperative antibiotics:  Anti-infectives (From admission, onward)    Start     Dose/Rate Route Frequency Ordered Stop   08/03/21 1600  ceFAZolin (ANCEF) IVPB 2g/100 mL premix        2 g 200 mL/hr over 30 Minutes Intravenous Every 6 hours 08/03/21 1104 08/03/21 2131   08/03/21 1230  valACYclovir (VALTREX) tablet 2,000 mg  Status:  Discontinued        2,000 mg Oral Every 12 hours PRN 08/03/21 1231 08/04/21 1813   08/03/21 0630  ceFAZolin (ANCEF) IVPB 2g/100 mL premix        2 g 200 mL/hr over 30 Minutes Intravenous On call to O.R. 08/03/21 3614 08/03/21 1009        Patient was given sequential compression devices, early ambulation, and chemoprophylaxis to prevent DVT. Patient worked with PT and was meeting their goals regarding safe ambulation and transfers.  Patient benefited maximally from hospital stay and there were no complications.    Recent vital  signs: No data found.   Recent laboratory studies: No results for input(s): WBC, HGB, HCT, PLT, NA, K, CL, CO2, BUN, CREATININE, GLUCOSE, INR, CALCIUM in the last 72 hours.  Invalid input(s): PT, 2   Discharge Medications:   Allergies as of 08/04/2021       Reactions   Codeine Itching   Sulfa Antibiotics    GI upset    Tape Itching, Rash        Medication List     STOP taking these medications    acetaminophen 650 MG CR tablet Commonly known as: TYLENOL       TAKE these medications    albuterol 108 (90 Base) MCG/ACT inhaler Commonly known as: VENTOLIN HFA Inhale 2 puffs into the lungs every 6 (six) hours as needed for wheezing or shortness of breath. Notes to patient: Resume home regimen   amoxicillin 500 MG capsule Commonly known as: AMOXIL Take 2,000 mg by mouth See admin instructions. Take 2000 mg 1 hour prior to dental work Notes to patient: Resume home regimen   atorvastatin 10 MG tablet Commonly known as: LIPITOR Take 10 mg by mouth daily in the afternoon.   CALCIUM 600 + D PO Take 2 tablets by mouth daily. Notes to patient: Resume home regimen   EPINEPHrine 0.3 mg/0.3 mL Soaj injection Commonly known as: EPI-PEN Inject 0.3 mg into the muscle as needed for anaphylaxis. Notes to patient: Resume home regimen   ferrous  sulfate 325 (65 FE) MG tablet Commonly known as: FerrouSul Take 1 tablet (325 mg total) by mouth 3 (three) times daily with meals for 14 days. What changed: when to take this   fluticasone 50 MCG/ACT nasal spray Commonly known as: FLONASE Place 2 sprays into both nostrils 3 (three) times daily.   Glucos-Chondroit-MSM Complex Tabs Take 3 tablets by mouth daily.   ipratropium 0.06 % nasal spray Commonly known as: ATROVENT Place 2 sprays into both nostrils 3 (three) times daily.   ketotifen 0.025 % ophthalmic solution Commonly known as: ZADITOR Place 1 drop into both eyes daily as needed (allergies). Notes to patient: Resume  home regimen   levocetirizine 5 MG tablet Commonly known as: XYZAL Take 5 mg by mouth every other day. Alternating with Montelukast Notes to patient: Resume home regimen   montelukast 10 MG tablet Commonly known as: SINGULAIR Take 10 mg by mouth every other day. Alternating with Xyzal Notes to patient: Last dose given 12/28 05:01pm   omeprazole 20 MG capsule Commonly known as: PRILOSEC Take 20 mg by mouth daily as needed (acid reflux).   sertraline 100 MG tablet Commonly known as: ZOLOFT Take 150 mg by mouth daily.   SYSTANE OP Place 1 drop into both eyes daily as needed (dry eyes). Notes to patient: Resume home regimen   valACYclovir 1000 MG tablet Commonly known as: VALTREX Take 2,000 mg by mouth every 12 (twelve) hours as needed (cold sores). Notes to patient: Resume home regimen   zolpidem 5 MG tablet Commonly known as: AMBIEN Take 2.5 mg by mouth at bedtime as needed for sleep. May take a second 2.5 mg dose if the first doesn't work Notes to patient: Resume home regimen               Discharge Care Instructions  (From admission, onward)           Start     Ordered   08/04/21 0000  Change dressing       Comments: Maintain surgical dressing until follow up in the clinic. If the edges start to pull up, may reinforce with tape. If the dressing is no longer working, may remove and cover with gauze and tape, but must keep the area dry and clean.  Call with any questions or concerns.   08/04/21 0734            Diagnostic Studies: DG Pelvis Portable  Result Date: 08/03/2021 CLINICAL DATA:  Status post total right hip arthroplasty EXAM: PORTABLE PELVIS 1-2 VIEWS COMPARISON:  Pelvic x-ray 10/08/2019. FINDINGS: Bilateral total hip arthroplasties visualized including recent arthroplasty on the right. No acute fracture or dislocation identified. Subcutaneous lucencies about the right hip consistent with recent surgery. IMPRESSION: No acute fracture or  dislocation identified. Electronically Signed   By: Ofilia Neas M.D.   On: 08/03/2021 11:47   DG C-Arm 1-60 Min-No Report  Result Date: 08/03/2021 Fluoroscopy was utilized by the requesting physician.  No radiographic interpretation.   DG C-Arm 1-60 Min-No Report  Result Date: 08/03/2021 Fluoroscopy was utilized by the requesting physician.  No radiographic interpretation.   DG HIP UNILAT WITH PELVIS 1V RIGHT  Result Date: 08/03/2021 CLINICAL DATA:  Hip replacement EXAM: DG HIP (WITH OR WITHOUT PELVIS) 1V RIGHT COMPARISON:  06/15/2017 radiography FINDINGS: Two anterior fluoroscopic images show intraprocedural right hip replacement. No evidence of fracture. Pre-existing left hip arthroplasty. IMPRESSION: Intraoperative fluoroscopy for right hip replacement. No unexpected finding. Electronically Signed   By: Jorje Guild  M.D.   On: 08/03/2021 11:31    Disposition: Discharge disposition: 01-Home or Self Care       Discharge Instructions     Call MD / Call 911   Complete by: As directed    If you experience chest pain or shortness of breath, CALL 911 and be transported to the hospital emergency room.  If you develope a fever above 101 F, pus (white drainage) or increased drainage or redness at the wound, or calf pain, call your surgeon's office.   Change dressing   Complete by: As directed    Maintain surgical dressing until follow up in the clinic. If the edges start to pull up, may reinforce with tape. If the dressing is no longer working, may remove and cover with gauze and tape, but must keep the area dry and clean.  Call with any questions or concerns.   Constipation Prevention   Complete by: As directed    Drink plenty of fluids.  Prune juice may be helpful.  You may use a stool softener, such as Colace (over the counter) 100 mg twice a day.  Use MiraLax (over the counter) for constipation as needed.   Diet - low sodium heart healthy   Complete by: As directed     Increase activity slowly as tolerated   Complete by: As directed    Weight bearing as tolerated with assist device (walker, cane, etc) as directed, use it as long as suggested by your surgeon or therapist, typically at least 4-6 weeks.   Post-operative opioid taper instructions:   Complete by: As directed    POST-OPERATIVE OPIOID TAPER INSTRUCTIONS: It is important to wean off of your opioid medication as soon as possible. If you do not need pain medication after your surgery it is ok to stop day one. Opioids include: Codeine, Hydrocodone(Norco, Vicodin), Oxycodone(Percocet, oxycontin) and hydromorphone amongst others.  Long term and even short term use of opiods can cause: Increased pain response Dependence Constipation Depression Respiratory depression And more.  Withdrawal symptoms can include Flu like symptoms Nausea, vomiting And more Techniques to manage these symptoms Hydrate well Eat regular healthy meals Stay active Use relaxation techniques(deep breathing, meditating, yoga) Do Not substitute Alcohol to help with tapering If you have been on opioids for less than two weeks and do not have pain than it is ok to stop all together.  Plan to wean off of opioids This plan should start within one week post op of your joint replacement. Maintain the same interval or time between taking each dose and first decrease the dose.  Cut the total daily intake of opioids by one tablet each day Next start to increase the time between doses. The last dose that should be eliminated is the evening dose.      TED hose   Complete by: As directed    Use stockings (TED hose) for 2 weeks on both leg(s).  You may remove them at night for sleeping.        Follow-up Information     Irving Copas, PA-C. Go on 08/18/2021.   Specialty: Orthopedic Surgery Why: You are scheduled for a follow up appointment on 08-18-21 at 10:15 am. Contact information: [address] [place] [address] [phone]                  Signed: Irving Copas 08/12/2021, 8:26 AM

## 2021-09-01 DIAGNOSIS — Z4789 Encounter for other orthopedic aftercare: Secondary | ICD-10-CM | POA: Diagnosis not present

## 2021-09-10 ENCOUNTER — Encounter (HOSPITAL_COMMUNITY): Payer: Self-pay

## 2021-09-10 ENCOUNTER — Other Ambulatory Visit: Payer: Self-pay

## 2021-09-10 ENCOUNTER — Encounter (HOSPITAL_COMMUNITY)
Admission: RE | Admit: 2021-09-10 | Discharge: 2021-09-10 | Disposition: A | Discharge status: Home / Self Care | Payer: Medicare Other | Source: Ambulatory Visit | Attending: Orthopedic Surgery | Admitting: Orthopedic Surgery

## 2021-09-10 VITALS — BP 142/90 | HR 77 | Temp 98.4°F | Resp 18 | Ht 65.0 in | Wt 152.0 lb

## 2021-09-10 DIAGNOSIS — Z01812 Encounter for preprocedural laboratory examination: Secondary | ICD-10-CM | POA: Insufficient documentation

## 2021-09-10 DIAGNOSIS — Z85828 Personal history of other malignant neoplasm of skin: Secondary | ICD-10-CM | POA: Diagnosis not present

## 2021-09-10 DIAGNOSIS — T84030A Mechanical loosening of internal right hip prosthetic joint, initial encounter: Secondary | ICD-10-CM | POA: Diagnosis not present

## 2021-09-10 DIAGNOSIS — Z131 Encounter for screening for diabetes mellitus: Secondary | ICD-10-CM | POA: Insufficient documentation

## 2021-09-10 DIAGNOSIS — Z01818 Encounter for other preprocedural examination: Secondary | ICD-10-CM

## 2021-09-10 DIAGNOSIS — J4520 Mild intermittent asthma, uncomplicated: Secondary | ICD-10-CM | POA: Diagnosis not present

## 2021-09-10 DIAGNOSIS — Z20822 Contact with and (suspected) exposure to covid-19: Secondary | ICD-10-CM | POA: Insufficient documentation

## 2021-09-10 DIAGNOSIS — M1990 Unspecified osteoarthritis, unspecified site: Secondary | ICD-10-CM | POA: Diagnosis not present

## 2021-09-10 DIAGNOSIS — R7303 Prediabetes: Secondary | ICD-10-CM | POA: Diagnosis not present

## 2021-09-10 DIAGNOSIS — Z471 Aftercare following joint replacement surgery: Secondary | ICD-10-CM | POA: Diagnosis not present

## 2021-09-10 DIAGNOSIS — Z9889 Other specified postprocedural states: Secondary | ICD-10-CM | POA: Diagnosis not present

## 2021-09-10 DIAGNOSIS — T84090A Other mechanical complication of internal right hip prosthesis, initial encounter: Secondary | ICD-10-CM | POA: Diagnosis not present

## 2021-09-10 DIAGNOSIS — D62 Acute posthemorrhagic anemia: Secondary | ICD-10-CM | POA: Diagnosis not present

## 2021-09-10 DIAGNOSIS — Z888 Allergy status to other drugs, medicaments and biological substances status: Secondary | ICD-10-CM | POA: Diagnosis not present

## 2021-09-10 DIAGNOSIS — M9701XA Periprosthetic fracture around internal prosthetic right hip joint, initial encounter: Secondary | ICD-10-CM | POA: Diagnosis not present

## 2021-09-10 DIAGNOSIS — E7800 Pure hypercholesterolemia, unspecified: Secondary | ICD-10-CM | POA: Diagnosis not present

## 2021-09-10 DIAGNOSIS — K219 Gastro-esophageal reflux disease without esophagitis: Secondary | ICD-10-CM | POA: Diagnosis not present

## 2021-09-10 DIAGNOSIS — Z885 Allergy status to narcotic agent status: Secondary | ICD-10-CM | POA: Diagnosis not present

## 2021-09-10 DIAGNOSIS — Z96643 Presence of artificial hip joint, bilateral: Secondary | ICD-10-CM | POA: Diagnosis not present

## 2021-09-10 DIAGNOSIS — I10 Essential (primary) hypertension: Secondary | ICD-10-CM | POA: Diagnosis not present

## 2021-09-10 DIAGNOSIS — Z882 Allergy status to sulfonamides status: Secondary | ICD-10-CM | POA: Diagnosis not present

## 2021-09-10 DIAGNOSIS — Z825 Family history of asthma and other chronic lower respiratory diseases: Secondary | ICD-10-CM | POA: Diagnosis not present

## 2021-09-10 DIAGNOSIS — Z801 Family history of malignant neoplasm of trachea, bronchus and lung: Secondary | ICD-10-CM | POA: Diagnosis not present

## 2021-09-10 DIAGNOSIS — Z96642 Presence of left artificial hip joint: Secondary | ICD-10-CM | POA: Diagnosis not present

## 2021-09-10 LAB — BASIC METABOLIC PANEL
Anion gap: 7 (ref 5–15)
BUN: 13 mg/dL (ref 8–23)
CO2: 25 mmol/L (ref 22–32)
Calcium: 9.4 mg/dL (ref 8.9–10.3)
Chloride: 100 mmol/L (ref 98–111)
Creatinine, Ser: 0.72 mg/dL (ref 0.44–1.00)
GFR, Estimated: >60 mL/min (ref >60)
Glucose, Bld: 94 mg/dL (ref 70–99)
Potassium: 4.2 mmol/L (ref 3.5–5.1)
Sodium: 132 mmol/L — ABNORMAL LOW (ref 135–145)

## 2021-09-10 LAB — GLUCOSE, CAPILLARY: Glucose-Capillary: 110 mg/dL — ABNORMAL HIGH (ref 70–99)

## 2021-09-10 LAB — SURGICAL PCR SCREEN
MRSA, PCR: NEGATIVE
Staphylococcus aureus: NEGATIVE

## 2021-09-10 NOTE — Progress Notes (Signed)
COVID test- 09/10/21 1:45 Bowel prep reminder:  PCP - Dr. Leia Alf Cardiologist - no  Chest x-ray - no EKG - 07/26/21-epic Stress Test - no ECHO - no Cardiac Cath - no Pacemaker/ICD device last checked:NA  Sleep Study - no CPAP -   Fasting Blood Sugar - Pre-diabetic. Pt doesn't test Checks Blood Sugar _____ times a day  Blood Thinner Instructions:NA Aspirin Instructions: Last Dose:  Anesthesia review: yes  Patient denies shortness of breath, fever, cough and chest pain at PAT appointment Pt does get SOB with 1 flight of stairs but no doing housework. Asthma is seasonal  Patient verbalized understanding of instructions that were given to them at the PAT appointment. Patient was also instructed that they will need to review over the PAT instructions again at home before surgery. yes

## 2021-09-10 NOTE — Patient Instructions (Addendum)
DUE TO COVID-19 ONLY ONE VISITOR IS ALLOWED TO COME WITH YOU AND STAY IN THE WAITING ROOM ONLY DURING PRE OP AND PROCEDURE DAY OF SURGERY IF YOU ARE GOING HOME AFTER SURGERY. IF YOU ARE SPENDING THE NIGHT 2 PEOPLE MAY VISIT WITH YOU IN YOUR PRIVATE ROOM AFTER SURGERY UNTIL VISITING  HOURS ARE OVER AT 800 PM AND 1  VISITOR  MAY  SPEND THE NIGHT.   YOU NEED TO HAVE A COVID 19 TEST ON_______ @_______ , THIS TEST MUST BE DONE BEFORE SURGERY,                 Traci Hudson     Your procedure is scheduled on: 09/13/21   Report to [facility] Main  Entrance   Report to admitting at   1:45 PM     Call this number if you have problems the morning of surgery [phone]    Remember: Do not eat food  :After Midnight the night before your surgery,   You may have clear liquids from midnight until ---.1:00 PM    CLEAR LIQUID DIET   Foods Allowed                                                                     Foods Excluded  Coffee and tea, regular and decaf                             liquids that you cannot  Plain Jell-O any favor except red or purple                                           see through such as: Fruit ices (not with fruit pulp)                                     milk, soups, orange juice  Iced Popsicles                                    All solid food Carbonated beverages, regular and diet                                    Cranberry, grape and apple juices Sports drinks like Gatorade Lightly seasoned clear broth or consume(fat free) Sugar     _____________________________________________________________________   BRUSH YOUR TEETH MORNING OF SURGERY AND RINSE YOUR MOUTH OUT, NO CHEWING GUM CANDY OR MINTS.     Take these medicines the morning of surgery with A SIP OF WATER: Zoloft, Omeprazole, use your Inhaler and bring it with you                                You may not have any metal on your body including hair pins and  piercings   Do not wear jewelry, make-up, lotions, powders or perfumes, deodorant             Do not wear nail polish on your fingernails.  Do not shave  48 hours prior to surgery.           Do not bring valuables to the hospital. [place].  Contacts, dentures or bridgework may not be worn into surgery.  Leave suitcase in the car. After surgery it may be brought to your room.                  Please read over the following fact sheets you were given: _____________________________________________________________________             [facility] - Preparing for Surgery Before surgery, you can play an important role.  Because skin is not sterile, your skin needs to be as free of germs as possible.  You can reduce the number of germs on your skin by washing with CHG (chlorahexidine gluconate) soap before surgery.  CHG is an antiseptic cleaner which kills germs and bonds with the skin to continue killing germs even after washing. Please DO NOT use if you have an allergy to CHG or antibacterial soaps.  If your skin becomes reddened/irritated stop using the CHG and inform your nurse when you arrive at Short Stay. Do not shave (including legs and underarms) for at least 48 hours prior to the first CHG shower.   Please follow these instructions carefully:  1.  Shower with CHG Soap the night before surgery and the  morning of Surgery.  2.  If you choose to wash your hair, wash your hair first as usual with your  normal  shampoo.  3.  After you shampoo, rinse your hair and body thoroughly to remove the  shampoo.                            4.  Use CHG as you would any other liquid soap.  You can apply chg directly  to the skin and wash                       Gently with a scrungie or clean washcloth.  5.  Apply the CHG Soap to your body ONLY FROM THE NECK DOWN.   Do not use on face/ open                           Wound or open sores. Avoid contact with eyes, ears  mouth and genitals (private parts).                       Wash face,  Genitals (private parts) with your normal soap.             6.  Wash thoroughly, paying special attention to the area where your surgery  will be performed.  7.  Thoroughly rinse your body with warm water from the neck down.  8.  DO NOT shower/wash with your normal soap after using and rinsing off  the CHG Soap.                9.  Pat yourself dry with a clean towel.  10.  Wear clean pajamas.            11.  Place clean sheets on your bed the night of your first shower and do not  sleep with pets. Day of Surgery : Do not apply any lotions/deodorants the morning of surgery.  Please wear clean clothes to the hospital/surgery center.  FAILURE TO FOLLOW THESE INSTRUCTIONS MAY RESULT IN THE CANCELLATION OF YOUR SURGERY PATIENT SIGNATURE_________________________________  NURSE SIGNATURE__________________________________  ________________________________________________________________________   Adam Phenix  An incentive spirometer is a tool that can help keep your lungs clear and active. This tool measures how well you are filling your lungs with each breath. Taking long deep breaths may help reverse or decrease the chance of developing breathing (pulmonary) problems (especially infection) following: A long period of time when you are unable to move or be active. BEFORE THE PROCEDURE  If the spirometer includes an indicator to show your best effort, your nurse or respiratory therapist will set it to a desired goal. If possible, sit up straight or lean slightly forward. Try not to slouch. Hold the incentive spirometer in an upright position. INSTRUCTIONS FOR USE  Sit on the edge of your bed if possible, or sit up as far as you can in bed or on a chair. Hold the incentive spirometer in an upright position. Breathe out normally. Place the mouthpiece in your mouth and seal your lips tightly around  it. Breathe in slowly and as deeply as possible, raising the piston or the ball toward the top of the column. Hold your breath for 3-5 seconds or for as long as possible. Allow the piston or ball to fall to the bottom of the column. Remove the mouthpiece from your mouth and breathe out normally. Rest for a few seconds and repeat Steps 1 through 7 at least 10 times every 1-2 hours when you are awake. Take your time and take a few normal breaths between deep breaths. The spirometer may include an indicator to show your best effort. Use the indicator as a goal to work toward during each repetition. After each set of 10 deep breaths, practice coughing to be sure your lungs are clear. If you have an incision (the cut made at the time of surgery), support your incision when coughing by placing a pillow or rolled up towels firmly against it. Once you are able to get out of bed, walk around indoors and cough well. You may stop using the incentive spirometer when instructed by your caregiver.  RISKS AND COMPLICATIONS Take your time so you do not get dizzy or light-headed. If you are in pain, you may need to take or ask for pain medication before doing incentive spirometry. It is harder to take a deep breath if you are having pain. AFTER USE Rest and breathe slowly and easily. It can be helpful to keep track of a log of your progress. Your caregiver can provide you with a simple table to help with this. If you are using the spirometer at home, follow these instructions: [place] IF:  You are having difficultly using the spirometer. You have trouble using the spirometer as often as instructed. Your pain medication is not giving enough relief while using the spirometer. You develop fever of 100.5 F (38.1 C) or higher. SEEK IMMEDIATE MEDICAL CARE IF:  You cough up bloody sputum that had not been present before. You develop fever of 102 F (38.9 C) or greater. You develop worsening pain at or  near the incision site. MAKE SURE YOU:  Understand these instructions. Will watch your condition. Will get help right away if you are not doing well or get worse. Document Released: 12/05/2006 Document Revised: 10/17/2011 Document Reviewed: 02/05/2007 [facility] Patient Information 2014 [place], [place].   ________________________________________________________________________

## 2021-09-11 LAB — SARS CORONAVIRUS 2 (TAT 6-24 HRS): SARS Coronavirus 2: NEGATIVE

## 2021-09-13 ENCOUNTER — Inpatient Hospital Stay (HOSPITAL_COMMUNITY): Payer: Medicare Other | Admitting: Certified Registered"

## 2021-09-13 ENCOUNTER — Encounter (HOSPITAL_COMMUNITY)
Admission: RE | Disposition: A | Discharge status: Home / Self Care | Payer: Self-pay | Source: Home / Self Care | Attending: Orthopedic Surgery

## 2021-09-13 ENCOUNTER — Other Ambulatory Visit: Payer: Self-pay

## 2021-09-13 ENCOUNTER — Inpatient Hospital Stay (HOSPITAL_COMMUNITY): Payer: Medicare Other

## 2021-09-13 ENCOUNTER — Encounter (HOSPITAL_COMMUNITY): Payer: Self-pay | Admitting: Orthopedic Surgery

## 2021-09-13 ENCOUNTER — Inpatient Hospital Stay (HOSPITAL_COMMUNITY)
Admission: RE | Admit: 2021-09-13 | Discharge: 2021-09-16 | DRG: 467 | Disposition: A | Discharge status: Home / Self Care | Payer: Medicare Other | Attending: Orthopedic Surgery | Admitting: Orthopedic Surgery

## 2021-09-13 DIAGNOSIS — Z825 Family history of asthma and other chronic lower respiratory diseases: Secondary | ICD-10-CM

## 2021-09-13 DIAGNOSIS — K219 Gastro-esophageal reflux disease without esophagitis: Secondary | ICD-10-CM | POA: Diagnosis present

## 2021-09-13 DIAGNOSIS — R7303 Prediabetes: Secondary | ICD-10-CM | POA: Diagnosis present

## 2021-09-13 DIAGNOSIS — Z882 Allergy status to sulfonamides status: Secondary | ICD-10-CM

## 2021-09-13 DIAGNOSIS — J4520 Mild intermittent asthma, uncomplicated: Secondary | ICD-10-CM | POA: Diagnosis present

## 2021-09-13 DIAGNOSIS — Z85828 Personal history of other malignant neoplasm of skin: Secondary | ICD-10-CM | POA: Diagnosis not present

## 2021-09-13 DIAGNOSIS — D62 Acute posthemorrhagic anemia: Secondary | ICD-10-CM | POA: Diagnosis not present

## 2021-09-13 DIAGNOSIS — Z20822 Contact with and (suspected) exposure to covid-19: Secondary | ICD-10-CM | POA: Diagnosis present

## 2021-09-13 DIAGNOSIS — Z96649 Presence of unspecified artificial hip joint: Principal | ICD-10-CM

## 2021-09-13 DIAGNOSIS — I10 Essential (primary) hypertension: Secondary | ICD-10-CM | POA: Diagnosis present

## 2021-09-13 DIAGNOSIS — Z96642 Presence of left artificial hip joint: Secondary | ICD-10-CM | POA: Diagnosis present

## 2021-09-13 DIAGNOSIS — M1990 Unspecified osteoarthritis, unspecified site: Secondary | ICD-10-CM | POA: Diagnosis present

## 2021-09-13 DIAGNOSIS — E7800 Pure hypercholesterolemia, unspecified: Secondary | ICD-10-CM | POA: Diagnosis present

## 2021-09-13 DIAGNOSIS — Z888 Allergy status to other drugs, medicaments and biological substances status: Secondary | ICD-10-CM

## 2021-09-13 DIAGNOSIS — M9701XA Periprosthetic fracture around internal prosthetic right hip joint, initial encounter: Principal | ICD-10-CM | POA: Diagnosis present

## 2021-09-13 DIAGNOSIS — Z885 Allergy status to narcotic agent status: Secondary | ICD-10-CM | POA: Diagnosis not present

## 2021-09-13 DIAGNOSIS — Z801 Family history of malignant neoplasm of trachea, bronchus and lung: Secondary | ICD-10-CM | POA: Diagnosis not present

## 2021-09-13 HISTORY — PX: FEMORAL REVISION: SHX5830

## 2021-09-13 LAB — TYPE AND SCREEN
ABO/RH(D): A POS
Antibody Screen: NEGATIVE

## 2021-09-13 SURGERY — FEMORAL REVISION
Anesthesia: Spinal | Laterality: Right

## 2021-09-13 MED ORDER — ARTIFICIAL TEARS OPHTHALMIC OINT
TOPICAL_OINTMENT | Freq: Every day | OPHTHALMIC | Status: DC | PRN
  Filled 2021-09-13: qty 3.5

## 2021-09-13 MED ORDER — 0.9 % SODIUM CHLORIDE (POUR BTL) OPTIME
TOPICAL | Status: DC | PRN
  Administered 2021-09-13: 1000 mL

## 2021-09-13 MED ORDER — KETOTIFEN FUMARATE 0.025 % OP SOLN
1.0000 [drp] | Freq: Every day | OPHTHALMIC | Status: DC | PRN
  Filled 2021-09-13: qty 5

## 2021-09-13 MED ORDER — PROPOFOL 500 MG/50ML IV EMUL
INTRAVENOUS | Status: DC | PRN
  Administered 2021-09-13: 50 ug/kg/min via INTRAVENOUS

## 2021-09-13 MED ORDER — ACETAMINOPHEN 160 MG/5ML PO SOLN: 325.0000 mg | Freq: Once | ORAL | Status: DC | PRN

## 2021-09-13 MED ORDER — IPRATROPIUM BROMIDE 0.06 % NA SOLN
2.0000 | Freq: Three times a day (TID) | NASAL | Status: DC | PRN
  Filled 2021-09-13: qty 15

## 2021-09-13 MED ORDER — ONDANSETRON HCL 4 MG PO TABS: 4.0000 mg | ORAL_TABLET | Freq: Four times a day (QID) | ORAL | Status: DC | PRN

## 2021-09-13 MED ORDER — ALBUMIN HUMAN 5 % IV SOLN
INTRAVENOUS | Status: AC
  Filled 2021-09-13: qty 250

## 2021-09-13 MED ORDER — CEFAZOLIN SODIUM-DEXTROSE 2-4 GM/100ML-% IV SOLN
2.0000 g | Freq: Four times a day (QID) | INTRAVENOUS | Status: AC
  Administered 2021-09-13 – 2021-09-14 (×2): 2 g via INTRAVENOUS
  Filled 2021-09-13 (×2): qty 100

## 2021-09-13 MED ORDER — CEFAZOLIN SODIUM-DEXTROSE 2-3 GM-%(50ML) IV SOLR
INTRAVENOUS | Status: DC | PRN
  Administered 2021-09-13: 2 g via INTRAVENOUS

## 2021-09-13 MED ORDER — DEXAMETHASONE SODIUM PHOSPHATE 10 MG/ML IJ SOLN
INTRAMUSCULAR | Status: DC | PRN
  Administered 2021-09-13: 10 mg via INTRAVENOUS

## 2021-09-13 MED ORDER — DOCUSATE SODIUM 100 MG PO CAPS
100.0000 mg | ORAL_CAPSULE | Freq: Two times a day (BID) | ORAL | Status: DC
  Administered 2021-09-13 – 2021-09-16 (×6): 100 mg via ORAL
  Filled 2021-09-13 (×6): qty 1

## 2021-09-13 MED ORDER — METHOCARBAMOL 500 MG IVPB - SIMPLE MED
INTRAVENOUS | Status: AC
  Filled 2021-09-13: qty 50

## 2021-09-13 MED ORDER — ONDANSETRON HCL 4 MG/2ML IJ SOLN
INTRAMUSCULAR | Status: AC
  Filled 2021-09-13: qty 2

## 2021-09-13 MED ORDER — ATORVASTATIN CALCIUM 10 MG PO TABS
10.0000 mg | ORAL_TABLET | Freq: Every day | ORAL | Status: DC
  Administered 2021-09-14 – 2021-09-16 (×3): 10 mg via ORAL
  Filled 2021-09-13 (×3): qty 1

## 2021-09-13 MED ORDER — MONTELUKAST SODIUM 10 MG PO TABS: 10.0000 mg | ORAL_TABLET | ORAL | Status: DC

## 2021-09-13 MED ORDER — PHENOL 1.4 % MT LIQD: 1.0000 | OROMUCOSAL | Status: DC | PRN

## 2021-09-13 MED ORDER — ALBUMIN HUMAN 5 % IV SOLN: INTRAVENOUS | Status: DC | PRN

## 2021-09-13 MED ORDER — HYDROCODONE-ACETAMINOPHEN 7.5-325 MG PO TABS
1.0000 | ORAL_TABLET | ORAL | Status: DC | PRN
  Administered 2021-09-13: 1 via ORAL
  Administered 2021-09-14 – 2021-09-15 (×4): 2 via ORAL
  Administered 2021-09-15: 1 via ORAL
  Filled 2021-09-13: qty 2
  Filled 2021-09-13: qty 1
  Filled 2021-09-13 (×2): qty 2
  Filled 2021-09-13: qty 1
  Filled 2021-09-13: qty 2

## 2021-09-13 MED ORDER — POLYETHYLENE GLYCOL 3350 17 G PO PACK: 17.0000 g | PACK | Freq: Every day | ORAL | Status: DC | PRN

## 2021-09-13 MED ORDER — ORAL CARE MOUTH RINSE: 15.0000 mL | Freq: Once | OROMUCOSAL | Status: AC

## 2021-09-13 MED ORDER — PHENYLEPHRINE 40 MCG/ML (10ML) SYRINGE FOR IV PUSH (FOR BLOOD PRESSURE SUPPORT)
PREFILLED_SYRINGE | INTRAVENOUS | Status: AC
  Filled 2021-09-13: qty 10

## 2021-09-13 MED ORDER — ONDANSETRON HCL 4 MG/2ML IJ SOLN
INTRAMUSCULAR | Status: DC | PRN
Start: 2021-09-13 — End: 2021-09-13
  Administered 2021-09-13: 4 mg via INTRAVENOUS

## 2021-09-13 MED ORDER — METHOCARBAMOL 500 MG IVPB - SIMPLE MED
500.0000 mg | Freq: Four times a day (QID) | INTRAVENOUS | Status: DC | PRN
  Administered 2021-09-13: 500 mg via INTRAVENOUS
  Filled 2021-09-13: qty 50

## 2021-09-13 MED ORDER — LACTATED RINGERS IV SOLN: INTRAVENOUS | Status: DC

## 2021-09-13 MED ORDER — ASPIRIN 81 MG PO CHEW
81.0000 mg | CHEWABLE_TABLET | Freq: Two times a day (BID) | ORAL | Status: DC
  Administered 2021-09-13 – 2021-09-16 (×6): 81 mg via ORAL
  Filled 2021-09-13 (×6): qty 1

## 2021-09-13 MED ORDER — SERTRALINE HCL 50 MG PO TABS
150.0000 mg | ORAL_TABLET | Freq: Every day | ORAL | Status: DC
  Administered 2021-09-14 – 2021-09-16 (×3): 150 mg via ORAL
  Filled 2021-09-13 (×3): qty 1

## 2021-09-13 MED ORDER — TRANEXAMIC ACID-NACL 1000-0.7 MG/100ML-% IV SOLN
1000.0000 mg | Freq: Once | INTRAVENOUS | Status: AC
  Administered 2021-09-13: 1000 mg via INTRAVENOUS
  Filled 2021-09-13: qty 100

## 2021-09-13 MED ORDER — MIDAZOLAM HCL 2 MG/2ML IJ SOLN
INTRAMUSCULAR | Status: DC | PRN
  Administered 2021-09-13 (×2): 1 mg via INTRAVENOUS

## 2021-09-13 MED ORDER — FERROUS SULFATE 325 (65 FE) MG PO TABS
325.0000 mg | ORAL_TABLET | Freq: Three times a day (TID) | ORAL | Status: DC
  Administered 2021-09-13 – 2021-09-16 (×9): 325 mg via ORAL
  Filled 2021-09-13 (×9): qty 1

## 2021-09-13 MED ORDER — SODIUM CHLORIDE 0.9 % IR SOLN
Status: DC | PRN
  Administered 2021-09-13: 1000 mL

## 2021-09-13 MED ORDER — PHENYLEPHRINE 40 MCG/ML (10ML) SYRINGE FOR IV PUSH (FOR BLOOD PRESSURE SUPPORT)
PREFILLED_SYRINGE | INTRAVENOUS | Status: DC | PRN
  Administered 2021-09-13 (×2): 120 ug via INTRAVENOUS

## 2021-09-13 MED ORDER — ZOLPIDEM TARTRATE 5 MG PO TABS
2.5000 mg | ORAL_TABLET | Freq: Every evening | ORAL | Status: DC | PRN
  Administered 2021-09-13 – 2021-09-15 (×4): 2.5 mg via ORAL
  Filled 2021-09-13 (×3): qty 1

## 2021-09-13 MED ORDER — BISACODYL 10 MG RE SUPP
10.0000 mg | Freq: Every day | RECTAL | Status: DC | PRN
  Administered 2021-09-16: 10 mg via RECTAL
  Filled 2021-09-13: qty 1

## 2021-09-13 MED ORDER — PROMETHAZINE HCL 25 MG/ML IJ SOLN: 6.2500 mg | INTRAMUSCULAR | Status: DC | PRN

## 2021-09-13 MED ORDER — ACETAMINOPHEN 325 MG PO TABS: 325.0000 mg | ORAL_TABLET | Freq: Once | ORAL | Status: DC | PRN

## 2021-09-13 MED ORDER — SODIUM CHLORIDE 0.9 % IV SOLN: INTRAVENOUS | Status: DC

## 2021-09-13 MED ORDER — TRANEXAMIC ACID-NACL 1000-0.7 MG/100ML-% IV SOLN
INTRAVENOUS | Status: AC
  Filled 2021-09-13: qty 100

## 2021-09-13 MED ORDER — HYDROMORPHONE HCL 1 MG/ML IJ SOLN
0.2500 mg | INTRAMUSCULAR | Status: DC | PRN
  Administered 2021-09-13 (×4): 0.5 mg via INTRAVENOUS

## 2021-09-13 MED ORDER — PHENYLEPHRINE HCL (PRESSORS) 10 MG/ML IV SOLN
INTRAVENOUS | Status: AC
  Filled 2021-09-13: qty 1

## 2021-09-13 MED ORDER — CEFAZOLIN SODIUM-DEXTROSE 2-4 GM/100ML-% IV SOLN
INTRAVENOUS | Status: AC
  Filled 2021-09-13: qty 100

## 2021-09-13 MED ORDER — HYDROMORPHONE HCL 1 MG/ML IJ SOLN
INTRAMUSCULAR | Status: AC
  Filled 2021-09-13: qty 1

## 2021-09-13 MED ORDER — STERILE WATER FOR IRRIGATION IR SOLN
Status: DC | PRN
  Administered 2021-09-13: 2000 mL

## 2021-09-13 MED ORDER — METHOCARBAMOL 500 MG PO TABS
500.0000 mg | ORAL_TABLET | Freq: Four times a day (QID) | ORAL | Status: DC | PRN
  Administered 2021-09-14 (×2): 500 mg via ORAL
  Filled 2021-09-13 (×4): qty 1

## 2021-09-13 MED ORDER — PROPOFOL 10 MG/ML IV BOLUS
INTRAVENOUS | Status: DC | PRN
  Administered 2021-09-13: 30 mg via INTRAVENOUS
  Administered 2021-09-13: 20 mg via INTRAVENOUS
  Administered 2021-09-13: 10 mg via INTRAVENOUS
  Administered 2021-09-13: 30 mg via INTRAVENOUS
  Administered 2021-09-13: 10 mg via INTRAVENOUS

## 2021-09-13 MED ORDER — PHENYLEPHRINE HCL-NACL 20-0.9 MG/250ML-% IV SOLN
INTRAVENOUS | Status: DC | PRN
  Administered 2021-09-13: 100 ug/min via INTRAVENOUS

## 2021-09-13 MED ORDER — LEVOCETIRIZINE DIHYDROCHLORIDE 5 MG PO TABS: 5.0000 mg | ORAL_TABLET | ORAL | Status: DC

## 2021-09-13 MED ORDER — TRANEXAMIC ACID-NACL 1000-0.7 MG/100ML-% IV SOLN
1000.0000 mg | INTRAVENOUS | Status: AC
  Administered 2021-09-13: 1000 mg via INTRAVENOUS

## 2021-09-13 MED ORDER — METOCLOPRAMIDE HCL 5 MG/ML IJ SOLN: 5.0000 mg | Freq: Three times a day (TID) | INTRAMUSCULAR | Status: DC | PRN

## 2021-09-13 MED ORDER — FLUTICASONE PROPIONATE 50 MCG/ACT NA SUSP
2.0000 | Freq: Two times a day (BID) | NASAL | Status: DC | PRN
  Filled 2021-09-13: qty 16

## 2021-09-13 MED ORDER — BUPIVACAINE IN DEXTROSE 0.75-8.25 % IT SOLN
INTRATHECAL | Status: DC | PRN
  Administered 2021-09-13: 2 mL via INTRATHECAL

## 2021-09-13 MED ORDER — ACETAMINOPHEN 10 MG/ML IV SOLN: 1000.0000 mg | Freq: Once | INTRAVENOUS | Status: DC | PRN

## 2021-09-13 MED ORDER — HYDROMORPHONE HCL 2 MG/ML IJ SOLN
INTRAMUSCULAR | Status: AC
  Filled 2021-09-13: qty 1

## 2021-09-13 MED ORDER — ONDANSETRON HCL 4 MG/2ML IJ SOLN
4.0000 mg | Freq: Four times a day (QID) | INTRAMUSCULAR | Status: DC | PRN
  Administered 2021-09-14: 4 mg via INTRAVENOUS
  Filled 2021-09-13: qty 2

## 2021-09-13 MED ORDER — MENTHOL 3 MG MT LOZG: 1.0000 | LOZENGE | OROMUCOSAL | Status: DC | PRN

## 2021-09-13 MED ORDER — METOCLOPRAMIDE HCL 5 MG PO TABS: 5.0000 mg | ORAL_TABLET | Freq: Three times a day (TID) | ORAL | Status: DC | PRN

## 2021-09-13 MED ORDER — CHLORHEXIDINE GLUCONATE 0.12 % MT SOLN
15.0000 mL | Freq: Once | OROMUCOSAL | Status: AC
  Administered 2021-09-13: 15 mL via OROMUCOSAL

## 2021-09-13 MED ORDER — DEXAMETHASONE SODIUM PHOSPHATE 10 MG/ML IJ SOLN
INTRAMUSCULAR | Status: AC
  Filled 2021-09-13: qty 1

## 2021-09-13 MED ORDER — MIDAZOLAM HCL 2 MG/2ML IJ SOLN
INTRAMUSCULAR | Status: AC
  Filled 2021-09-13: qty 2

## 2021-09-13 MED ORDER — HYDROMORPHONE HCL 1 MG/ML IJ SOLN
INTRAMUSCULAR | Status: DC | PRN
Start: 2021-09-13 — End: 2021-09-13
  Administered 2021-09-13: 1 mg via INTRAVENOUS

## 2021-09-13 MED ORDER — ALBUTEROL SULFATE (2.5 MG/3ML) 0.083% IN NEBU: 3.0000 mL | INHALATION_SOLUTION | Freq: Four times a day (QID) | RESPIRATORY_TRACT | Status: DC | PRN

## 2021-09-13 MED ORDER — AMISULPRIDE (ANTIEMETIC) 5 MG/2ML IV SOLN: 10.0000 mg | Freq: Once | INTRAVENOUS | Status: DC | PRN

## 2021-09-13 MED ORDER — ACETAMINOPHEN 325 MG PO TABS
325.0000 mg | ORAL_TABLET | Freq: Four times a day (QID) | ORAL | Status: DC | PRN
  Administered 2021-09-14: 650 mg via ORAL
  Filled 2021-09-13: qty 2

## 2021-09-13 MED ORDER — HYDROCODONE-ACETAMINOPHEN 5-325 MG PO TABS
1.0000 | ORAL_TABLET | ORAL | Status: DC | PRN
  Administered 2021-09-15 – 2021-09-16 (×3): 2 via ORAL
  Filled 2021-09-13 (×4): qty 2

## 2021-09-13 MED ORDER — MORPHINE SULFATE (PF) 2 MG/ML IV SOLN
0.5000 mg | INTRAVENOUS | Status: DC | PRN
  Administered 2021-09-13 – 2021-09-14 (×2): 1 mg via INTRAVENOUS
  Filled 2021-09-13 (×2): qty 1

## 2021-09-13 MED ORDER — PANTOPRAZOLE SODIUM 40 MG PO TBEC
40.0000 mg | DELAYED_RELEASE_TABLET | Freq: Every day | ORAL | Status: DC
  Administered 2021-09-14 – 2021-09-16 (×3): 40 mg via ORAL
  Filled 2021-09-13 (×3): qty 1

## 2021-09-13 MED ORDER — DEXAMETHASONE SODIUM PHOSPHATE 10 MG/ML IJ SOLN
10.0000 mg | Freq: Once | INTRAMUSCULAR | Status: AC
  Administered 2021-09-14: 10 mg via INTRAVENOUS
  Filled 2021-09-13: qty 1

## 2021-09-13 MED ORDER — POLYETHYL GLYCOL-PROPYL GLYCOL 0.4-0.3 % OP GEL: Freq: Every day | OPHTHALMIC | Status: DC | PRN

## 2021-09-13 MED ORDER — MEPERIDINE HCL 50 MG/ML IJ SOLN: 6.2500 mg | INTRAMUSCULAR | Status: DC | PRN

## 2021-09-13 MED ORDER — DIPHENHYDRAMINE HCL 12.5 MG/5ML PO ELIX: 12.5000 mg | ORAL_SOLUTION | ORAL | Status: DC | PRN

## 2021-09-13 SURGICAL SUPPLY — 62 items
ADH SKN CLS APL DERMABOND .7 (GAUZE/BANDAGES/DRESSINGS) ×1
BAG COUNTER SPONGE SURGICOUNT (BAG) ×2 IMPLANT
BAG DECANTER FOR FLEXI CONT (MISCELLANEOUS) ×2 IMPLANT
BAG SPEC THK2 15X12 ZIP CLS (MISCELLANEOUS) ×1
BAG SPNG CNTER NS LX DISP (BAG) ×2
BAG ZIPLOCK 12X15 (MISCELLANEOUS) ×3 IMPLANT
BLADE SAW SGTL 18X1.27X75 (BLADE) IMPLANT
BNDG CMPR 5X2 CHSV 1 LYR STRL (GAUZE/BANDAGES/DRESSINGS) ×1
BNDG COHESIVE 2X5 TAN ST LF (GAUZE/BANDAGES/DRESSINGS) ×1 IMPLANT
BOLT LOCKING 20X75 (Bolt) ×1 IMPLANT
BRUSH FEMORAL CANAL (MISCELLANEOUS) IMPLANT
CABLE CERLAGE W/CRIMP 1.8 (Cable) ×1 IMPLANT
COVER SURGICAL LIGHT HANDLE (MISCELLANEOUS) ×3 IMPLANT
DERMABOND ADVANCED (GAUZE/BANDAGES/DRESSINGS) ×1
DERMABOND ADVANCED .7 DNX12 (GAUZE/BANDAGES/DRESSINGS) ×2 IMPLANT
DRAPE INCISE IOBAN 85X60 (DRAPES) ×3 IMPLANT
DRAPE ORTHO SPLIT 77X108 STRL (DRAPES) ×4
DRAPE POUCH INSTRU U-SHP 10X18 (DRAPES) ×3 IMPLANT
DRAPE SURG 17X11 SM STRL (DRAPES) ×3 IMPLANT
DRAPE SURG ORHT 6 SPLT 77X108 (DRAPES) ×4 IMPLANT
DRAPE U-SHAPE 47X51 STRL (DRAPES) ×3 IMPLANT
DRSG AQUACEL AG ADV 3.5X10 (GAUZE/BANDAGES/DRESSINGS) IMPLANT
DRSG AQUACEL AG ADV 3.5X14 (GAUZE/BANDAGES/DRESSINGS) ×1 IMPLANT
DURAPREP 26ML APPLICATOR (WOUND CARE) ×3 IMPLANT
ELECT BLADE TIP CTD 4 INCH (ELECTRODE) ×3 IMPLANT
ELECT REM PT RETURN 15FT ADLT (MISCELLANEOUS) ×3 IMPLANT
FACESHIELD WRAPAROUND (MASK) ×8 IMPLANT
FACESHIELD WRAPAROUND OR TEAM (MASK) ×8 IMPLANT
GAUZE 4X4 16PLY ~~LOC~~+RFID DBL (SPONGE) IMPLANT
GLOVE SURG ENC MOIS LTX SZ6 (GLOVE) ×6 IMPLANT
GLOVE SURG UNDER LTX SZ6.5 (GLOVE) ×3 IMPLANT
GLOVE SURG UNDER POLY LF SZ7.5 (GLOVE) ×3 IMPLANT
GOWN STRL REUS W/TWL LRG LVL3 (GOWN DISPOSABLE) ×6 IMPLANT
HANDPIECE INTERPULSE COAX TIP (DISPOSABLE)
HEAD CERAMIC 36 PLUS5 (Hips) ×1 IMPLANT
HOLDER FOLEY CATH W/STRAP (MISCELLANEOUS) ×1 IMPLANT
KIT BASIN OR (CUSTOM PROCEDURE TRAY) ×3 IMPLANT
KIT TURNOVER KIT A (KITS) IMPLANT
MANIFOLD NEPTUNE II (INSTRUMENTS) ×3 IMPLANT
NDL SAFETY ECLIPSE 18X1.5 (NEEDLE) ×2 IMPLANT
NEEDLE HYPO 18GX1.5 SHARP (NEEDLE)
NS IRRIG 1000ML POUR BTL (IV SOLUTION) ×3 IMPLANT
PACK TOTAL JOINT (CUSTOM PROCEDURE TRAY) ×3 IMPLANT
PADDING CAST COTTON 6X4 STRL (CAST SUPPLIES) ×1 IMPLANT
PROTECTOR NERVE ULNAR (MISCELLANEOUS) ×3 IMPLANT
RECLAIM DISTAL TAPERED 14X140 (Hips) ×1 IMPLANT
SET HNDPC FAN SPRY TIP SCT (DISPOSABLE) ×2 IMPLANT
SOLUTION IRRIG SURGIPHOR (IV SOLUTION) IMPLANT
SPONGE T-LAP 18X18 ~~LOC~~+RFID (SPONGE) ×10 IMPLANT
STAPLER VISISTAT 35W (STAPLE) IMPLANT
SUCTION FRAZIER HANDLE 12FR (TUBING) ×2
SUCTION TUBE FRAZIER 12FR DISP (TUBING) ×2 IMPLANT
SUT MNCRL AB 4-0 PS2 18 (SUTURE) ×1 IMPLANT
SUT STRATAFIX PDS+ 0 24IN (SUTURE) ×3 IMPLANT
SUT VIC AB 1 CT1 36 (SUTURE) ×3 IMPLANT
SUT VIC AB 2-0 CT1 27 (SUTURE) ×4
SUT VIC AB 2-0 CT1 TAPERPNT 27 (SUTURE) ×4 IMPLANT
TOWEL OR 17X26 10 PK STRL BLUE (TOWEL DISPOSABLE) ×6 IMPLANT
TRAY FOLEY MTR SLVR 14FR STAT (SET/KITS/TRAYS/PACK) ×1 IMPLANT
TRAY FOLEY MTR SLVR 16FR STAT (SET/KITS/TRAYS/PACK) ×2 IMPLANT
TUBE SUCTION HIGH CAP CLEAR NV (SUCTIONS) ×3 IMPLANT
WATER STERILE IRR 1000ML POUR (IV SOLUTION) ×6 IMPLANT

## 2021-09-13 NOTE — Plan of Care (Signed)

## 2021-09-13 NOTE — Anesthesia Procedure Notes (Signed)
Spinal  Patient location during procedure: OR Start time: 09/13/2021 2:25 PM End time: 09/13/2021 2:29 PM Reason for block: surgical anesthesia Staffing Performed: resident/CRNA  Resident/CRNA: Sharlette Dense, CRNA Preanesthetic Checklist Completed: patient identified, IV checked, site marked, risks and benefits discussed, surgical consent, monitors and equipment checked, pre-op evaluation and timeout performed Spinal Block Patient position: sitting Prep: DuraPrep and site prepped and draped Patient monitoring: heart rate, continuous pulse ox and blood pressure Approach: midline Location: L3-4 Injection technique: single-shot Needle Needle type: Pencan  Needle gauge: 24 G Needle length: 9 cm Assessment Sensory level: T4 Events: CSF return Additional Notes Kit expiration 05/25/2023 and lot # [id number] Clear free flow of CSF, negative heme, negative paresthesia Tolerated well and returned to right lateral position for 2 minutes.  Then positioned supine.

## 2021-09-13 NOTE — Interval H&P Note (Signed)
History and Physical Interval Note:  09/13/2021 2:14 PM  Traci Hudson  has presented today for surgery, with the diagnosis of Right hip periprosthetic fracture.  The various methods of treatment have been discussed with the patient and family. After consideration of risks, benefits and other options for treatment, the patient has consented to  Procedure(s) with comments: Revision Right femoral component posterior approach (Right) - 90 mins Dr. Alvan Dame requests to start at 2pm!! as a surgical intervention.  The patient's history has been reviewed, patient examined, no change in status, stable for surgery.  I have reviewed the patient's chart and labs.  Questions were answered to the patient's satisfaction.     [name]

## 2021-09-13 NOTE — Discharge Instructions (Signed)

## 2021-09-13 NOTE — Anesthesia Preprocedure Evaluation (Addendum)
Anesthesia Evaluation  Patient identified by MRN, date of birth, ID band Patient awake    Reviewed: Allergy & Precautions, NPO status , Patient's Chart, lab work & pertinent test results  Airway Mallampati: II  TM Distance: >3 FB Neck ROM: Full    Dental  (+) Teeth Intact, Dental Advisory Given   Pulmonary asthma ,    breath sounds clear to auscultation       Cardiovascular hypertension,  Rhythm:Regular Rate:Normal     Neuro/Psych PSYCHIATRIC DISORDERS Depression negative neurological ROS     GI/Hepatic Neg liver ROS, GERD  ,  Endo/Other  negative endocrine ROS  Renal/GU negative Renal ROS     Musculoskeletal  (+) Arthritis ,   Abdominal Normal abdominal exam  (+)   Peds  Hematology negative hematology ROS (+)   Anesthesia Other Findings   Reproductive/Obstetrics                            Anesthesia Physical Anesthesia Plan  ASA: 2  Anesthesia Plan: Spinal   Post-op Pain Management:    Induction: Intravenous  PONV Risk Score and Plan: 3 and Ondansetron, Dexamethasone and Propofol infusion  Airway Management Planned: Natural Airway and Simple Face Mask  Additional Equipment: None  Intra-op Plan:   Post-operative Plan:   Informed Consent: I have reviewed the patients History and Physical, chart, labs and discussed the procedure including the risks, benefits and alternatives for the proposed anesthesia with the patient or authorized representative who has indicated his/her understanding and acceptance.       Plan Discussed with: CRNA  Anesthesia Plan Comments:        Anesthesia Quick Evaluation

## 2021-09-13 NOTE — Transfer of Care (Signed)
Immediate Anesthesia Transfer of Care Note  Patient: Traci Hudson  Procedure(s) Performed: Revision Right femoral component posterior approach (Right)  Patient Location: PACU  Anesthesia Type:Spinal  Level of Consciousness: awake, alert , oriented and patient cooperative  Airway & Oxygen Therapy: Patient Spontanous Breathing and Patient connected to face mask oxygen  Post-op Assessment: Report given to RN and Post -op Vital signs reviewed and stable  Post vital signs: Reviewed and stable  Last Vitals:  Vitals Value Taken Time  BP 116/76 09/13/21 1654  Temp    Pulse 80 09/13/21 1655  Resp 17 09/13/21 1655  SpO2 95 % 09/13/21 1655  Vitals shown include unvalidated device data.  Last Pain:  Vitals:   09/13/21 1313  TempSrc:   PainSc: 0-No pain         Complications: No notable events documented.

## 2021-09-13 NOTE — H&P (Signed)
TOTAL HIP REVISION ADMISSION H&P  Patient is admitted for right revision total hip arthroplasty.  Subjective:  Chief Complaint: right hip pain  HPI: Traci Hudson, 66 y.o. female, has a history of right total hip arthroplasty on 08/03/21. She was doing well in the early post-op period, but came in for her first post op visit and her x-rays revealed peri-prosthetic femur fracture. Dr. Alvan Dame had a long discussion with her about allowing this to heal non-operatively vs surgical treatment with revision total hip arthroplasty. She elects to proceed with revision surgery. Dr. Alvan Dame reviewed risks, benefits, and expectations of surgery.   There is no current active infection.  Patient Active Problem List   Diagnosis Date Noted   S/P total right hip arthroplasty 08/03/2021   Overweight (BMI 25.0-29.9) 10/09/2019   S/P left THA, AA 10/08/2019   SBO (small bowel obstruction) ([place]) 01/13/2016   Upper airway cough syndrome vs cough variant asthma 01/31/2015   Solitary pulmonary nodule 01/31/2015   Intermittent asthma, well controlled 01/31/2015   Past Medical History:  Diagnosis Date   Arthritis    Asthma    seasonal   Depression    Diverticulitis    Dyspnea    With asthma flares   GERD (gastroesophageal reflux disease)    Hypercholesteremia    Hypertension    Insomnia    Pre-diabetes    Skin cancer     Past Surgical History:  Procedure Laterality Date   ABDOMINAL HYSTERECTOMY  1985   APPENDECTOMY  08/08/1996   BRAIN SURGERY  08/09/1967   BREAST REDUCTION SURGERY  08/08/1998   CHOLECYSTECTOMY  1998   TOTAL HIP ARTHROPLASTY Left 10/08/2019   Procedure: TOTAL HIP ARTHROPLASTY ANTERIOR APPROACH;  Surgeon: Paralee Cancel, MD;  Location: [facility];  Service: Orthopedics;  Laterality: Left;  70 mins   TOTAL HIP ARTHROPLASTY Right 08/03/2021   Procedure: TOTAL HIP ARTHROPLASTY ANTERIOR APPROACH;  Surgeon: Paralee Cancel, MD;  Location: [facility];  Service: Orthopedics;  Laterality: Right;    VESICOVAGINAL FISTULA CLOSURE W/ TAH  2017    Current Facility-Administered Medications  Medication Dose Route Frequency Provider Last Rate Last Admin   lactated ringers infusion   Intravenous Continuous Pervis Hocking, DO 10 mL/hr at 09/13/21 1314 New Bag at 09/13/21 1314   Allergies  Allergen Reactions   Codeine Itching   Sulfa Antibiotics     GI upset    Tape Itching and Rash    Social History   Tobacco Use   Smoking status: Never   Smokeless tobacco: Never  Substance Use Topics   Alcohol use: Yes    Alcohol/week: 2.0 standard drinks    Types: 2 Glasses of wine per week    Comment: occasionally    Family History  Problem Relation Age of Onset   Emphysema Mother        smoked   Lung cancer Mother        smoked      Review of Systems  Constitutional:  Negative for chills and fever.  Respiratory:  Negative for cough and shortness of breath.   Cardiovascular:  Negative for chest pain.  Gastrointestinal:  Negative for nausea and vomiting.  Musculoskeletal:  Positive for arthralgias.    Objective:  Physical Exam Constitutional:      Appearance: Normal appearance. She is normal weight.  HENT:     Head: Normocephalic and atraumatic.  Eyes:     Pupils: Pupils are equal, round, and reactive to light.  Neurological:  Mental Status: She is alert.  Right Hip: Mild discomfort with gentle ROM. Anterior scar well healed.    Vital signs in last 24 hours: Temp:  [98.6 F (37 C)] 98.6 F (37 C) (02/06 1253) Pulse Rate:  [71] 71 (02/06 1253) Resp:  [18] 18 (02/06 1253) BP: (146)/(84) 146/84 (02/06 1253) SpO2:  [97 %] 97 % (02/06 1253) Weight:  [68.9 kg] 68.9 kg (02/06 1313)   Labs:   Estimated body mass index is 25.29 kg/m as calculated from the following:   Height as of 09/10/21: 5\' 5"  (1.651 m).   Weight as of this encounter: 68.9 kg.  Imaging Review:  Plain radiographs demonstrate right total hip arthroplasty components in place. There is mild  subsidence of the femoral stem with [place] B periprosthetic femur fracture noted on the right. Acetabulular components in good positioning.     Assessment/Plan:  Periprosthetic femur fracture, right hip(s) with failed previous arthroplasty.  The patient history, physical examination, clinical judgement of the provider and imaging studies are consistent with periprosthetic femur fracture of the right hip(s), previous total hip arthroplasty. Revision total hip arthroplasty is deemed medically necessary. The treatment options including medical management, injection therapy, arthroscopy and arthroplasty were discussed at length. The risks and benefits of total hip arthroplasty were presented and reviewed. The risks due to aseptic loosening, infection, stiffness, dislocation/subluxation,  thromboembolic complications and other imponderables were discussed.  The patient acknowledged the explanation, agreed to proceed with the plan and consent was signed. Patient is being admitted for inpatient treatment for surgery, pain control, PT, OT, prophylactic antibiotics, VTE prophylaxis, progressive ambulation and ADL's and discharge planning. The patient is planning to be discharged  home.  Costella Hatcher, PA-C Orthopedic Surgery [facility] [place] [phone]

## 2021-09-13 NOTE — Anesthesia Procedure Notes (Signed)
Date/Time: 09/13/2021 2:25 PM Performed by: Sharlette Dense, CRNA Oxygen Delivery Method: Simple face mask

## 2021-09-14 LAB — CBC
HCT: 30.4 % — ABNORMAL LOW (ref 36.0–46.0)
Hemoglobin: 10.2 g/dL — ABNORMAL LOW (ref 12.0–15.0)
MCH: 28.2 pg (ref 26.0–34.0)
MCHC: 33.6 g/dL (ref 30.0–36.0)
MCV: 84 fL (ref 80.0–100.0)
Platelets: 197 10*3/uL (ref 150–400)
RBC: 3.62 MIL/uL — ABNORMAL LOW (ref 3.87–5.11)
RDW: 13.2 % (ref 11.5–15.5)
WBC: 13.7 10*3/uL — ABNORMAL HIGH (ref 4.0–10.5)
nRBC: 0 % (ref 0.0–0.2)

## 2021-09-14 LAB — BASIC METABOLIC PANEL
Anion gap: 7 (ref 5–15)
BUN: 10 mg/dL (ref 8–23)
CO2: 26 mmol/L (ref 22–32)
Calcium: 8.7 mg/dL — ABNORMAL LOW (ref 8.9–10.3)
Chloride: 99 mmol/L (ref 98–111)
Creatinine, Ser: 0.6 mg/dL (ref 0.44–1.00)
GFR, Estimated: >60 mL/min (ref >60)
Glucose, Bld: 141 mg/dL — ABNORMAL HIGH (ref 70–99)
Potassium: 4.2 mmol/L (ref 3.5–5.1)
Sodium: 132 mmol/L — ABNORMAL LOW (ref 135–145)

## 2021-09-14 NOTE — Evaluation (Signed)
Physical Therapy Evaluation Patient Details Name: Traci Hudson MRN: [id number] DOB: 1956-01-11 Today's Date: 09/14/2021  History of Present Illness  Pt is a 66 year old female s/p ORIF right periprosthetic femur fracture and Revision right THR- femoral component and head ball on 09/13/21.  PMH: R DA THA, L DA THA, HTN, SBO  Clinical Impression  Patient is s/p above surgery resulting in functional limitations due to the deficits listed below (see PT Problem List). Patient will benefit from skilled PT to increase their independence and safety with mobility to allow discharge to the venue listed below.  Pt educated on posterior hip precautions and requiring cues during mobility to maintain.  Pt with nausea ambulating in room and had episode of emesis.  Pt plans to return home upon d/c with assist from friends.  Pt will need to practice stairs prior to d/c.     Recommendations for follow up therapy are one component of a multi-disciplinary discharge planning process, led by the attending physician.  Recommendations may be updated based on patient status, additional functional criteria and insurance authorization.  Follow Up Recommendations Follow physician's recommendations for discharge plan and follow up therapies    Assistance Recommended at Discharge Frequent or constant Supervision/Assistance  Patient can return home with the following  A little help with walking and/or transfers;A little help with bathing/dressing/bathroom;Help with stairs or ramp for entrance    Equipment Recommendations None recommended by PT  Recommendations for Other Services       Functional Status Assessment Patient has had a recent decline in their functional status and demonstrates the ability to make significant improvements in function in a reasonable and predictable amount of time.     Precautions / Restrictions Precautions Precautions: Fall;Posterior Hip Precaution Comments: pt educated on posterior hip  precautions Restrictions Weight Bearing Restrictions: No RLE Weight Bearing: Weight bearing as tolerated Other Position/Activity Restrictions: WBAT      Mobility  Bed Mobility Overal bed mobility: Needs Assistance Bed Mobility: Supine to Sit     Supine to sit: Min assist     General bed mobility comments: assist for R LE and maintaining precautions    Transfers Overall transfer level: Needs assistance Equipment used: Rolling walker (2 wheels) Transfers: Sit to/from Stand Sit to Stand: Min assist           General transfer comment: verbal and visual cues for UE and LE positioning, maintaining precautions    Ambulation/Gait Ambulation/Gait assistance: Min assist Gait Distance (Feet): 15 Feet Assistive device: Rolling walker (2 wheels) Gait Pattern/deviations: Step-to pattern, Antalgic, Decreased stance time - right       General Gait Details: verbal cues for sequence, RW positioning, maintaining posterior hip precautions; pt only able to tolerate ambulating to bathroom and then over to recliner due to nausea; emesis upon sitting in recliner; RN notified and brought meds  Stairs            Wheelchair Mobility    Modified Rankin (Stroke Patients Only)       Balance                                             Pertinent Vitals/Pain Pain Assessment Pain Assessment: 0-10 Pain Score: 9  Pain Location: right hip Pain Descriptors / Indicators: Sore, Aching Pain Intervention(s): Monitored during session, Premedicated before session, Repositioned    Home Living Family/patient  expects to be discharged to:: Private residence Living Arrangements: Alone Available Help at Discharge: Family;Available 24 hours/day Type of Home: House Home Access: Level entry     Alternate Level Stairs-Number of Steps: flight Home Layout: Two level;Able to live on main level with bedroom/bathroom Home Equipment: Rolling Walker (2  wheels);BSC/3in1 Additional Comments: plans to stay upstairs in bedroom    Prior Function Prior Level of Function : Independent/Modified Independent                     Hand Dominance        Extremity/Trunk Assessment        Lower Extremity Assessment Lower Extremity Assessment: RLE deficits/detail RLE Deficits / Details: pt requiring assist for bed mobility, anticipated post op hip weakness observed, maintained precautions, able to perform ankle pumps       Communication   Communication: No difficulties  Cognition Arousal/Alertness: Awake/alert Behavior During Therapy: WFL for tasks assessed/performed Overall Cognitive Status: Within Functional Limits for tasks assessed                                          General Comments      Exercises     Assessment/Plan    PT Assessment Patient needs continued PT services  PT Problem List Decreased strength;Decreased mobility;Decreased range of motion;Decreased activity tolerance;Pain;Decreased knowledge of use of DME;Decreased knowledge of precautions       PT Treatment Interventions DME instruction;Therapeutic activities;Gait training;Functional mobility training;Therapeutic exercise;Patient/family education;Stair training    PT Goals (Current goals can be found in the Care Plan section)  Acute Rehab PT Goals PT Goal Formulation: With patient Time For Goal Achievement: 09/21/21 Potential to Achieve Goals: Good    Frequency 7X/week     Co-evaluation               AM-PAC PT "6 Clicks" Mobility  Outcome Measure Help needed turning from your back to your side while in a flat bed without using bedrails?: A Little Help needed moving from lying on your back to sitting on the side of a flat bed without using bedrails?: A Little Help needed moving to and from a bed to a chair (including a wheelchair)?: A Little Help needed standing up from a chair using your arms (e.g., wheelchair or  bedside chair)?: A Little Help needed to walk in hospital room?: A Lot Help needed climbing 3-5 steps with a railing? : A Lot 6 Click Score: 16    End of Session Equipment Utilized During Treatment: Gait belt Activity Tolerance: Patient tolerated treatment well Patient left: in chair;with call bell/phone within reach;with chair alarm set Nurse Communication: Mobility status PT Visit Diagnosis: Other abnormalities of gait and mobility (R26.89);Difficulty in walking, not elsewhere classified (R26.2)    Time: 3532-9924 PT Time Calculation (min) (ACUTE ONLY): 30 min   Charges:   PT Evaluation $PT Eval Low Complexity: 1 Low PT Treatments $Gait Training: 8-22 mins      Jannette Spanner PT, DPT Acute Rehabilitation Services Pager: [phone] Office: [place] 09/14/2021, 12:11 PM

## 2021-09-14 NOTE — TOC Transition Note (Signed)
Transition of Care [facility]) - CM/SW Discharge Note   Patient Details  Name: Traci Hudson MRN: [id number] Date of Birth: 1956/03/01  [facility]) CM/SW Contact:  Lennart Pall, LCSW Phone Number: 09/14/2021, 12:47 PM   Clinical Narrative:    Met briefly with pt and confirming she has all needed DME at home.  Plan HEP.  No TOC needs.   Final next level of care: Home/Self Care Barriers to Discharge: No Barriers Identified   Patient Goals and CMS Choice Patient states their goals for this hospitalization and ongoing recovery are:: return home      Discharge Placement                       Discharge Plan and Services                DME Arranged: N/A DME Agency: NA                  Social Determinants of Health (SDOH) Interventions     Readmission Risk Interventions No flowsheet data found.

## 2021-09-14 NOTE — Plan of Care (Signed)

## 2021-09-14 NOTE — Anesthesia Postprocedure Evaluation (Signed)
Anesthesia Post Note  Patient: Traci Hudson  Procedure(s) Performed: Revision Right femoral component posterior approach (Right)     Patient location during evaluation: PACU Anesthesia Type: Spinal Level of consciousness: oriented and awake and alert Pain management: pain level controlled Vital Signs Assessment: post-procedure vital signs reviewed and stable Respiratory status: spontaneous breathing, respiratory function stable and patient connected to nasal cannula oxygen Cardiovascular status: blood pressure returned to baseline and stable Postop Assessment: no headache, no backache and no apparent nausea or vomiting Anesthetic complications: no   No notable events documented.               Effie Berkshire

## 2021-09-14 NOTE — Progress Notes (Signed)
Patient ID: Traci Hudson, female   DOB: [birth year], 66 y.o.   MRN: [id number] Subjective: 1 Day Post-Op Procedure(s) (LRB): Revision Right femoral component posterior approach (Right)    Patient reports pain as moderate.  Kind of a tough night but in great spirits this morning as is typical for her. No significant events  Objective:   VITALS:   Vitals:   09/14/21 0032 09/14/21 0430  BP: (!) 147/86 (!) 141/100  Pulse: 78 77  Resp: 15 12  Temp: 98 F (36.7 C) 97.9 F (36.6 C)  SpO2: 98% 97%    Neurovascular intact Incision: dressing C/D/I  LABS Recent Labs    09/14/21 0337  HGB 10.2*  HCT 30.4*  WBC 13.7*  PLT 197    Recent Labs    09/14/21 0337  NA 132*  K 4.2  BUN 10  CREATININE 0.60  GLUCOSE 141*    No results for input(s): LABPT, INR in the last 72 hours.   Assessment/Plan: 1 Day Post-Op Procedure(s) (LRB): Revision Right femoral component posterior approach (Right)   Up with therapy - WBAT RLE Work with stairs for safe home discharge I reviewed intra-operative findings Home likely tomorrow versus Thursday depending on therapy progress

## 2021-09-14 NOTE — Op Note (Signed)
NAMEBRAYLI, Traci Hudson MEDICAL RECORD NO: [id number] ACCOUNT NO: [id number] DATE OF BIRTH: 13-Aug-1955 FACILITY: [facility] PHYSICIAN: Pietro Cassis. Alvan Dame, MD  Operative Report   DATE OF PROCEDURE: 09/13/2021  PREOPERATIVE DIAGNOSIS:  Right periprosthetic femur fracture with loose femoral component.  POSTOPERATIVE DIAGNOSIS:  Right periprosthetic femur fracture with loose femoral component.  PROCEDURE: 1.  Open reduction internal fixation of right periprosthetic femur fracture. 2.  Revision right total hip replacement with femoral component and femoral head ball.  COMPONENTS USED: 1.  A single Zimmer cable utilized to fix the fracture site. 2.  Revision femoral component through DePuy including a Reclaim knee system with a 14 x 140 mm tapered stem with a 20 x 75 mm conical proximal body with a 36+5 delta ceramic ball.  SURGEON:  Pietro Cassis. Alvan Dame, MD  ASSISTANT:  Costella Hatcher, PA-C.  Note that Ms. Traci Hudson was present for the entirety of the case from preoperative positioning, perioperative management of the operative extremity, general facilitation of the case and primary wound closure.  ANESTHESIA:  Spinal.  BLOOD LOSS:  About 700 mL.  DRAINS:  None.  COMPLICATIONS:  None apparent.  INDICATION FOR PROCEDURE:  The patient is a very pleasant 66 year old female who underwent a right total hip arthroplasty approximately 2 months ago.  When she came back into the office for her 6-week radiographic followup, she was noted to have a  fracture involving the medial aspect of the proximal femur about 3 cm below the lesser trochanter.  With this, there was evidence of femoral component subsidence.  Based on these findings, we had a lengthy discussion how to proceed.  One option was to  limit weightbearing and allow the fracture to heal and allow the femoral component to remain stable.  The consequence of this would be resultant shortening to the right lower extremity.  The other  option was to proceed with a revision surgery and to fix  the fracture for more stability and confidence.  After the discussion and a short period of time for her to think about it over a weekend, she contacted the office and wished to proceed with surgery.  We had reviewed the risks and benefits of the  procedure as noted.  Consent was obtained for benefit of pain relief as well as management of her fracture.  PROCEDURE IN DETAIL:  The patient was brought to the operative theater.  Once adequate anesthesia, preoperative antibiotics, Ancef administered as well as tranexamic acid and Decadron, she was positioned into the left lateral decubitus position with the  right hip up.  I had reviewed with her that to do this procedure, I would do it through a posterior approach, so that I have better access and management of her femoral fracture, but also for preparing the femur for a longer stem.  The right hip was then  prepped and draped in sterile fashion.  A timeout was performed identifying the patient, planned procedure, and extremity.  An incision was made for posterior approach to the hip.  Soft tissue exposure was carried out exposing the iliotibial band and  gluteal fascia to allow for exposure of the proximal femur for cabling.  Initially, I exposed the posterior aspect of the hip as well as the insertion site of the gluteus maximus.  I then exposed a bit of the posterior lateral aspect of the hip beneath  the lesser trochanter.  I then used a cable passer and passed a single cable and tensioned this lightly around  the fracture site.  With the fracture now at least stable, I then further exposed the posterior aspect of the hip and dislocated the hip.  We  removed the femoral head.  I then used osteotomes and worked around the proximal femur and was able to remove the femoral component without complication or significant bone loss.  There was evidence of some injury to the bone related to the component   subsidence at the medial calcar, but no loss of bone with removal of the component.  At this point, I prepared the femur for a Reclaim revision construct.  I first reamed it distally with a 14 mm reamer, which was the smallest reamer.  I felt that I met  resistance before I could get the component in orientation for the shortest cone body.  I then reamed with the 13 mm starter reamer as well as a 14 mm 190 mm reamer in order to get the 14 mm reamer to an appropriate position per protocol.  Once this was  in its appropriate place, we did a trial reduction with a 75-mm body and the neck and head ball.  Based on evaluation of her radiographs as well as her positioning preoperatively, I had identified what I would hope to regain in terms of her leg length.   Upon evaluation, I found that with this construct, the +5 ball matched her leg length as well as provided stability without evidence of any impingement or subluxation.  Her combined anteversion was noted to be about 50 degrees without evidence of  impingement with extension and external rotation.  Given these findings, we removed all the trial components.  The final 14 x 140 mm tapered stem was opened.  It was impacted with the insertion jigs to the location where the reamer had sat.  We had  prepared the proximal femur with the appropriate reamer for the 20 mm cone.  This was then impacted down to the stem and then appropriately tensioned with the anteversion of the femoral component based off my trials.  We then retrialed and again selected  the +5 ball, which I felt gave [place] the best chance to match her leg lengths.  The final 36+5 ball was then opened and impacted on a clean and dry trunnion.  The hip had been irrigated throughout the case and again at this point.  There was no significant  hemostasis required at this point.  We then reapproximated the iliotibial band and gluteal fascia using #1 Vicryl and Stratafix sutures.  The remainder of the wound  was closed with 2-0 Vicryl and a running Monocryl stitch.  The hip wound was clean, dry  and dressed sterilely using surgical glue and Aquacel dressing.  The patient was brought to the recovery room in stable condition, tolerated the procedure well.  Findings reviewed with her friend.  POSTOPERATIVE PLAN:  She will be admitted to the hospital.  She will be weightbearing as tolerated.  We will work on physical therapy for stairs and would likely discharge on postop day #2-3 depending on her activity level.   NIK D: 09/14/2021 7:30:46 am T: 09/14/2021 9:08:00 am  JOB: [id number]/ [id number]

## 2021-09-14 NOTE — Progress Notes (Signed)
Physical Therapy Treatment Patient Details Name: Traci Hudson MRN: [id number] DOB: Feb 24, 1956 Today's Date: 09/14/2021   History of Present Illness Pt is a 66 year old female s/p ORIF right periprosthetic femur fracture and Revision right THR- femoral component and head ball on 09/13/21.  PMH: R DA THA, L DA THA, HTN, SBO    PT Comments    Pt assisted with ambulating to bathroom and then became lightheaded and dizzy upon ambulating in room so returned to bed.  BPs in mobility sections below.  Pt also performed a couple exercises upon return to supine.  Pt not yet ready for d/c.    Recommendations for follow up therapy are one component of a multi-disciplinary discharge planning process, led by the attending physician.  Recommendations may be updated based on patient status, additional functional criteria and insurance authorization.  Follow Up Recommendations  Follow physician's recommendations for discharge plan and follow up therapies Would benefit from [facility] Recommended at Discharge Frequent or constant Supervision/Assistance  Patient can return home with the following A little help with walking and/or transfers;A little help with bathing/dressing/bathroom;Help with stairs or ramp for entrance   Equipment Recommendations  None recommended by PT    Recommendations for Other Services       Precautions / Restrictions Precautions Precautions: Fall;Posterior Hip Precaution Comments: reviewed posterior hip precautions Restrictions Weight Bearing Restrictions: No Other Position/Activity Restrictions: WBAT     Mobility  Bed Mobility Overal bed mobility: Needs Assistance Bed Mobility: Supine to Sit, Sit to Supine     Supine to sit: Min guard, HOB elevated Sit to supine: Mod assist   General bed mobility comments: pt self assisted for R LE and maintaining precautions; more assist for return to bed with LEs due to dizziness/faintness    Transfers Overall  transfer level: Needs assistance Equipment used: Rolling walker (2 wheels) Transfers: Sit to/from Stand Sit to Stand: Min assist           General transfer comment: verbal and visual cues for UE and LE positioning, maintaining precautions    Ambulation/Gait Ambulation/Gait assistance: Min assist Gait Distance (Feet): 15 Feet Assistive device: Rolling walker (2 wheels) Gait Pattern/deviations: Step-to pattern, Antalgic, Decreased stance time - right       General Gait Details: verbal cues for sequence, RW positioning, maintaining posterior hip precautions; pt only able to tolerate ambulating to bathroom then became lightheaded and needed to sit down; BP 116/74 mmHg, HR 81 bpm upon return to supine; BP improved to 136/71 mmHg within a couple minutes of being supine (pt reports this BP is near her baseline) RN notified   Stairs             Wheelchair Mobility    Modified Rankin (Stroke Patients Only)       Balance                                            Cognition Arousal/Alertness: Awake/alert Behavior During Therapy: WFL for tasks assessed/performed Overall Cognitive Status: Within Functional Limits for tasks assessed                                          Exercises Total Joint Exercises Ankle Circles/Pumps: AROM, Both, 10 reps Quad Sets:  AROM, Both, 10 reps Gluteal Sets: AROM, Both, 10 reps    General Comments        Pertinent Vitals/Pain Pain Assessment Pain Assessment: 0-10 Pain Score: 6  Pain Location: right hip Pain Descriptors / Indicators: Sore, Aching Pain Intervention(s): Repositioned, Monitored during session    Home Living                          Prior Function            PT Goals (current goals can now be found in the care plan section) Acute Rehab PT Goals PT Goal Formulation: With patient Time For Goal Achievement: 09/21/21 Potential to Achieve Goals: Good Progress towards PT  goals: Progressing toward goals    Frequency    7X/week      PT Plan Current plan remains appropriate    Co-evaluation              AM-PAC PT "6 Clicks" Mobility   Outcome Measure  Help needed turning from your back to your side while in a flat bed without using bedrails?: A Little Help needed moving from lying on your back to sitting on the side of a flat bed without using bedrails?: A Little Help needed moving to and from a bed to a chair (including a wheelchair)?: A Little Help needed standing up from a chair using your arms (e.g., wheelchair or bedside chair)?: A Little Help needed to walk in hospital room?: A Lot Help needed climbing 3-5 steps with a railing? : A Lot 6 Click Score: 16    End of Session Equipment Utilized During Treatment: Gait belt Activity Tolerance: Patient tolerated treatment well Patient left: with call bell/phone within reach;in bed;with bed alarm set Nurse Communication: Mobility status PT Visit Diagnosis: Other abnormalities of gait and mobility (R26.89);Difficulty in walking, not elsewhere classified (R26.2)     Time: 1443-1500 PT Time Calculation (min) (ACUTE ONLY): 17 min  Charges:  $Gait Training: 8-22 mins                    Jannette Spanner PT, DPT Acute Rehabilitation Services Pager: [phone] Office: [place] 09/14/2021, 3:15 PM

## 2021-09-14 NOTE — Brief Op Note (Signed)
09/13/2021  7:20 AM  PATIENT:  Traci Hudson  66 y.o. female  PRE-OPERATIVE DIAGNOSIS:  Right hip periprosthetic fracture with femoral component loosening  POST-OPERATIVE DIAGNOSIS:  Right hip periprosthetic fracture with femoral component loosening  PROCEDURE:  Procedure(s) with comments: ORIF right periprosthetic femur fracture Revision right THR- femoral component and head ball  SURGEON:  Surgeon(s) and Role:    Paralee Cancel, MD - Primary  PHYSICIAN ASSISTANT: Costella Hatcher, PA-C  ANESTHESIA:   spinal  EBL:  700 mL   BLOOD ADMINISTERED:none  DRAINS: none   LOCAL MEDICATIONS USED:  NONE  SPECIMEN:  No Specimen  DISPOSITION OF SPECIMEN:  N/A  COUNTS:  YES  TOURNIQUET:  * No tourniquets in log *  DICTATION: .Other Dictation: Dictation Number [id number]  PLAN OF CARE: Admit to inpatient   PATIENT DISPOSITION:  PACU - hemodynamically stable.   Delay start of Pharmacological VTE agent (>24hrs) due to surgical blood loss or risk of bleeding: no

## 2021-09-14 NOTE — Progress Notes (Deleted)
Family request Foley Catheter not come out until patient is more alert.

## 2021-09-15 LAB — CBC
HCT: 26.4 % — ABNORMAL LOW (ref 36.0–46.0)
Hemoglobin: 8.8 g/dL — ABNORMAL LOW (ref 12.0–15.0)
MCH: 28 pg (ref 26.0–34.0)
MCHC: 33.3 g/dL (ref 30.0–36.0)
MCV: 84.1 fL (ref 80.0–100.0)
Platelets: 211 10*3/uL (ref 150–400)
RBC: 3.14 MIL/uL — ABNORMAL LOW (ref 3.87–5.11)
RDW: 13.4 % (ref 11.5–15.5)
WBC: 12.4 10*3/uL — ABNORMAL HIGH (ref 4.0–10.5)
nRBC: 0 % (ref 0.0–0.2)

## 2021-09-15 MED ORDER — SODIUM CHLORIDE 0.9 % IV BOLUS
500.0000 mL | Freq: Once | INTRAVENOUS | Status: AC
  Administered 2021-09-15: 500 mL via INTRAVENOUS

## 2021-09-15 NOTE — Progress Notes (Signed)
Physical Therapy Treatment Patient Details Name: Traci Hudson MRN: [id number] DOB: 08-23-1955 Today's Date: 09/15/2021   History of Present Illness Pt is a 66 year old female s/p ORIF right periprosthetic femur fracture and Revision right THR- femoral component and head ball on 09/13/21.  PMH: R DA THA, L DA THA, HTN, SBO    PT Comments    Pt able to ambulate in hallway without dizziness this afternoon however voices concern about feeling dizzy with mobility and stairs if home today so plans to remain overnight.  Friend (who will be assisting her upon d/c) present and plans to return tomorrow morning to be available for stair training since pt plans to get upstairs to her bedroom upon d/c.      Recommendations for follow up therapy are one component of a multi-disciplinary discharge planning process, led by the attending physician.  Recommendations may be updated based on patient status, additional functional criteria and insurance authorization.  Follow Up Recommendations  Follow physician's recommendations for discharge plan and follow up therapies     Assistance Recommended at Discharge Frequent or constant Supervision/Assistance  Patient can return home with the following A little help with walking and/or transfers;A little help with bathing/dressing/bathroom;Help with stairs or ramp for entrance   Equipment Recommendations  None recommended by PT    Recommendations for Other Services       Precautions / Restrictions Precautions Precautions: Fall;Posterior Hip Precaution Comments: reviewed posterior hip precautions Restrictions RLE Weight Bearing: Weight bearing as tolerated     Mobility  Bed Mobility Overal bed mobility: Needs Assistance Bed Mobility: Supine to Sit, Sit to Supine     Supine to sit: Min guard, HOB elevated Sit to supine: Min guard, HOB elevated   General bed mobility comments: pt self assisted for R LE and cues for maintaining precautions     Transfers Overall transfer level: Needs assistance Equipment used: Rolling walker (2 wheels) Transfers: Sit to/from Stand Sit to Stand: Min guard           General transfer comment: verbal and visual cues for UE and LE positioning, maintaining precautions    Ambulation/Gait Ambulation/Gait assistance: Min guard Gait Distance (Feet): 120 Feet Assistive device: Rolling walker (2 wheels) Gait Pattern/deviations: Step-to pattern, Antalgic, Decreased stance time - right       General Gait Details: verbal cues for sequence, RW positioning, maintaining posterior hip precautions; pt denies dizziness/weakness feeling this afternoon (received bolus since last session)   Stairs             Wheelchair Mobility    Modified Rankin (Stroke Patients Only)       Balance                                            Cognition Arousal/Alertness: Awake/alert Behavior During Therapy: WFL for tasks assessed/performed Overall Cognitive Status: Within Functional Limits for tasks assessed                                          Exercises Total Joint Exercises Ankle Circles/Pumps: AROM, Both, 10 reps Quad Sets: AROM, Both, 10 reps Heel Slides: AAROM, Right, 10 reps, Other (comment) (all exercises performed within precautions) Hip ABduction/ADduction: Right, 10 reps, AAROM    General Comments  Pertinent Vitals/Pain Pain Assessment Pain Assessment: 0-10 Pain Score: 6  Pain Location: right hip Pain Descriptors / Indicators: Sore, Aching Pain Intervention(s): Monitored during session, Repositioned    Home Living                          Prior Function            PT Goals (current goals can now be found in the care plan section) Progress towards PT goals: Progressing toward goals    Frequency    7X/week      PT Plan Current plan remains appropriate    Co-evaluation              AM-PAC PT "6 Clicks"  Mobility   Outcome Measure  Help needed turning from your back to your side while in a flat bed without using bedrails?: A Little Help needed moving from lying on your back to sitting on the side of a flat bed without using bedrails?: A Little Help needed moving to and from a bed to a chair (including a wheelchair)?: A Little Help needed standing up from a chair using your arms (e.g., wheelchair or bedside chair)?: A Little Help needed to walk in hospital room?: A Little Help needed climbing 3-5 steps with a railing? : A Lot 6 Click Score: 17    End of Session Equipment Utilized During Treatment: Gait belt Activity Tolerance: Patient tolerated treatment well Patient left: with call bell/phone within reach;with family/visitor present;in bed;with bed alarm set Nurse Communication: Mobility status PT Visit Diagnosis: Other abnormalities of gait and mobility (R26.89);Difficulty in walking, not elsewhere classified (R26.2)     Time: 5573-2202 PT Time Calculation (min) (ACUTE ONLY): 21 min  Charges:  $Gait Training: 8-22 mins                     Jannette Spanner PT, DPT Acute Rehabilitation Services Pager: [phone] Office: [place] 09/15/2021, 3:05 PM

## 2021-09-15 NOTE — Progress Notes (Signed)
° °  Subjective: 2 Days Post-Op Procedure(s) (LRB): Revision Right femoral component posterior approach (Right) Patient reports pain as mild.   Patient seen in rounds for Dr. Alvan Dame. Patient is  resting in bed on exam this morning. She reports this has been more than she expected from surgery, but she is doing fairly well.  We will continue therapy today.   Objective: Vital signs in last 24 hours: Temp:  [97.9 F (36.6 C)-98.5 F (36.9 C)] 98.3 F (36.8 C) (02/08 1239) Pulse Rate:  [65-81] 76 (02/08 1256) Resp:  [15-20] 20 (02/08 1239) BP: (118-145)/(65-76) 127/73 (02/08 1256) SpO2:  [96 %-100 %] 97 % (02/08 1256)  Intake/Output from previous day:  Intake/Output Summary (Last 24 hours) at 09/15/2021 1309 Last data filed at 09/15/2021 0600 Gross per 24 hour  Intake 1606.25 ml  Output 400 ml  Net 1206.25 ml     Intake/Output this shift: No intake/output data recorded.  Labs: Recent Labs    09/14/21 0337 09/15/21 0317  HGB 10.2* 8.8*   Recent Labs    09/14/21 0337 09/15/21 0317  WBC 13.7* 12.4*  RBC 3.62* 3.14*  HCT 30.4* 26.4*  PLT 197 211   Recent Labs    09/14/21 0337  NA 132*  K 4.2  CL 99  CO2 26  BUN 10  CREATININE 0.60  GLUCOSE 141*  CALCIUM 8.7*   No results for input(s): LABPT, INR in the last 72 hours.  Exam: General - Patient is Alert and Oriented Extremity - Neurologically intact Sensation intact distally Intact pulses distally Dorsiflexion/Plantar flexion intact Dressing - dressing C/D/I Motor Function - intact, moving foot and toes well on exam.   Past Medical History:  Diagnosis Date   Arthritis    Asthma    seasonal   Depression    Diverticulitis    Dyspnea    With asthma flares   GERD (gastroesophageal reflux disease)    Hypercholesteremia    Hypertension    Insomnia    Pre-diabetes    Skin cancer     Assessment/Plan: 2 Days Post-Op Procedure(s) (LRB): Revision Right femoral component posterior approach  (Right) Principal Problem:   S/P revision of right total hip  Estimated body mass index is 25.29 kg/m as calculated from the following:   Height as of this encounter: 5\' 5"  (1.651 m).   Weight as of this encounter: 68.9 kg. Advance diet Up with therapy D/C IV fluids  DVT Prophylaxis - Aspirin Weight bearing as tolerated Begin therapy Hip precautions discussed with patient  ABLA: Hgb stable at 8.8 this AM.   Plan is to go Home after hospital stay. Will give a few doses of toradol for pain control today. Continue working with PT. Patient would like to d/c home today, but depends on progression with therapy and pain control.  Follow up in the office in 2 weeks.   Griffith Citron, PA-C Orthopedic Surgery [phone] 09/15/2021, 1:09 PM

## 2021-09-15 NOTE — Plan of Care (Signed)
°  Problem: Pain Management: Goal: Pain level will decrease with appropriate interventions Outcome: Progressing   Problem: Activity: Goal: Risk for activity intolerance will decrease Outcome: Progressing   

## 2021-09-15 NOTE — Progress Notes (Signed)
Physical Therapy Treatment Patient Details Name: Traci Hudson MRN: [id number] DOB: 1956-06-18 Today's Date: 09/15/2021   History of Present Illness Pt is a 66 year old female s/p ORIF right periprosthetic femur fracture and Revision right THR- femoral component and head ball on 09/13/21.  PMH: R DA THA, L DA THA, HTN, SBO    PT Comments    Pt assisted to bathroom and also able to progress to ambulate in hallway today however weak and dizzy upon returning to room.  BP 91/64 mmHg upon sitting in recliner and RN notified.  Pt's friend that will be home with her was present for session today and observed.     Recommendations for follow up therapy are one component of a multi-disciplinary discharge planning process, led by the attending physician.  Recommendations may be updated based on patient status, additional functional criteria and insurance authorization.  Follow Up Recommendations  Follow physician's recommendations for discharge plan and follow up therapies     Assistance Recommended at Discharge Frequent or constant Supervision/Assistance  Patient can return home with the following A little help with walking and/or transfers;A little help with bathing/dressing/bathroom;Help with stairs or ramp for entrance   Equipment Recommendations  None recommended by PT    Recommendations for Other Services       Precautions / Restrictions Precautions Precautions: Fall;Posterior Hip Precaution Comments: reviewed posterior hip precautions Restrictions RLE Weight Bearing: Weight bearing as tolerated     Mobility  Bed Mobility Overal bed mobility: Needs Assistance Bed Mobility: Supine to Sit     Supine to sit: Min guard, HOB elevated     General bed mobility comments: pt self assisted for R LE and cues for maintaining precautions    Transfers Overall transfer level: Needs assistance Equipment used: Rolling walker (2 wheels) Transfers: Sit to/from Stand Sit to Stand: Min  guard           General transfer comment: verbal and visual cues for UE and LE positioning, maintaining precautions    Ambulation/Gait Ambulation/Gait assistance: Min guard Gait Distance (Feet): 80 Feet Assistive device: Rolling walker (2 wheels) Gait Pattern/deviations: Step-to pattern, Antalgic, Decreased stance time - right       General Gait Details: verbal cues for sequence, RW positioning, maintaining posterior hip precautions; pt reported dizziness and weakness upon returning to room; BP 91/64 mmHg HR 90 upon sitting in recliner; after a few minutes BP 106/64 mmHg HR 73 bpm and RN notified   Stairs             Wheelchair Mobility    Modified Rankin (Stroke Patients Only)       Balance                                            Cognition Arousal/Alertness: Awake/alert Behavior During Therapy: WFL for tasks assessed/performed Overall Cognitive Status: Within Functional Limits for tasks assessed                                          Exercises      General Comments        Pertinent Vitals/Pain Pain Assessment Pain Assessment: 0-10 Pain Score: 6  Pain Location: right hip Pain Descriptors / Indicators: Sore, Aching Pain Intervention(s): Repositioned, Monitored during session, Ice  applied    Home Living                          Prior Function            PT Goals (current goals can now be found in the care plan section) Progress towards PT goals: Progressing toward goals    Frequency    7X/week      PT Plan Current plan remains appropriate    Co-evaluation              AM-PAC PT "6 Clicks" Mobility   Outcome Measure  Help needed turning from your back to your side while in a flat bed without using bedrails?: A Little Help needed moving from lying on your back to sitting on the side of a flat bed without using bedrails?: A Little Help needed moving to and from a bed to a chair  (including a wheelchair)?: A Little Help needed standing up from a chair using your arms (e.g., wheelchair or bedside chair)?: A Little Help needed to walk in hospital room?: A Lot Help needed climbing 3-5 steps with a railing? : A Lot 6 Click Score: 16    End of Session Equipment Utilized During Treatment: Gait belt Activity Tolerance: Patient tolerated treatment well Patient left: with call bell/phone within reach;in chair;with chair alarm set;with family/visitor present Nurse Communication: Mobility status PT Visit Diagnosis: Other abnormalities of gait and mobility (R26.89);Difficulty in walking, not elsewhere classified (R26.2)     Time: 0623-7628 PT Time Calculation (min) (ACUTE ONLY): 23 min  Charges:  $Gait Training: 23-37 mins                    Jannette Spanner PT, DPT Acute Rehabilitation Services Pager: [phone] Office: [place] 09/15/2021, 1:21 PM

## 2021-09-16 ENCOUNTER — Encounter (HOSPITAL_COMMUNITY): Payer: Self-pay | Admitting: Orthopedic Surgery

## 2021-09-16 LAB — CBC
HCT: 24.6 % — ABNORMAL LOW (ref 36.0–46.0)
Hemoglobin: 8.2 g/dL — ABNORMAL LOW (ref 12.0–15.0)
MCH: 28.1 pg (ref 26.0–34.0)
MCHC: 33.3 g/dL (ref 30.0–36.0)
MCV: 84.2 fL (ref 80.0–100.0)
Platelets: 150 10*3/uL (ref 150–400)
RBC: 2.92 MIL/uL — ABNORMAL LOW (ref 3.87–5.11)
RDW: 13.6 % (ref 11.5–15.5)
WBC: 8.7 10*3/uL (ref 4.0–10.5)
nRBC: 0 % (ref 0.0–0.2)

## 2021-09-16 MED ORDER — SODIUM CHLORIDE 0.9 % IV BOLUS
250.0000 mL | Freq: Once | INTRAVENOUS | Status: AC
  Administered 2021-09-16: 250 mL via INTRAVENOUS

## 2021-09-16 MED ORDER — HYDROCODONE-ACETAMINOPHEN 7.5-325 MG PO TABS: 1.0000 | ORAL_TABLET | Freq: Four times a day (QID) | ORAL | 0 refills | Status: AC | PRN

## 2021-09-16 MED ORDER — ASPIRIN 81 MG PO CHEW: 81.0000 mg | CHEWABLE_TABLET | Freq: Two times a day (BID) | ORAL | 0 refills | Status: AC

## 2021-09-16 NOTE — Plan of Care (Signed)
Discharge instructions given to the patient including medications. ?

## 2021-09-16 NOTE — Care Management Important Message (Signed)
Important Message  Patient Details IM Letter given to the Patient. Name: Traci Hudson MRN: [id number] Date of Birth: 24-Mar-1956   Medicare Important Message Given:  Yes     [name] 09/16/2021, 1:18 PM

## 2021-09-16 NOTE — Evaluation (Signed)
Occupational Therapy Evaluation Patient Details Name: AMANDAMARIE FEGGINS MRN: [id number] DOB: Oct 31, 1955 Today's Date: 09/16/2021   History of Present Illness Pt is a 66 year old female s/p ORIF right periprosthetic femur fracture and Revision right THR- femoral component and head ball on 09/13/21.  PMH: R DA THA, L DA THA, HTN, SBO   Clinical Impression   Ms. Arden Axon is a 66 year old woman s/p hip replacement who presents with decreased ROM and strength of RLE, posterior hip precautions, impaired balance, decreased activity tolerance and pain. Patient needing min guard with all standing and ambulation and verbal cues for safe transfers to maintain hip precautions.Patient needing assistance for LB ADLs due to posterior hip precautions. Patient provided with instruction on how to use [facility] for dressing and how to use sock aide as well as instruction for use on other equipment. Patient has all needed DME at home as well as AE. Patient will benefit from skilled OT services while in hospital to improve deficits and learn compensatory strategies as needed in order to return to PLOF.  Recommend HH OT at discharge for safety and reiteration of all education.       Recommendations for follow up therapy are one component of a multi-disciplinary discharge planning process, led by the attending physician.  Recommendations may be updated based on patient status, additional functional criteria and insurance authorization.   Follow Up Recommendations  Home health OT    Assistance Recommended at Discharge Intermittent Supervision/Assistance  Patient can return home with the following A little help with walking and/or transfers;A little help with bathing/dressing/bathroom;Help with stairs or ramp for entrance;Assistance with cooking/housework    Functional Status Assessment  Patient has had a recent decline in their functional status and demonstrates the ability to make significant improvements in function in  a reasonable and predictable amount of time.  Equipment Recommendations  None recommended by OT    Recommendations for Other Services       Precautions / Restrictions Precautions Precautions: Fall;Posterior Hip Precaution Comments: reviewed posterior hip precautions Restrictions Weight Bearing Restrictions: No RLE Weight Bearing: Weight bearing as tolerated Other Position/Activity Restrictions: WBAT      Mobility Bed Mobility                    Transfers                          Balance Overall balance assessment: Needs assistance Sitting-balance support: No upper extremity supported Sitting balance-Leahy Scale: Good     Standing balance support: Reliant on assistive device for balance Standing balance-Leahy Scale: Fair Standing balance comment: able to take hands off of walker for ADLS                           ADL either performed or assessed with clinical judgement   ADL Overall ADL's : Needs assistance/impaired Eating/Feeding: Independent   Grooming: Standing;Supervision/safety   Upper Body Bathing: Standing;Supervision/ safety   Lower Body Bathing: Minimal assistance;Sit to/from stand   Upper Body Dressing : Set up   Lower Body Dressing: With adaptive equipment;Sit to/from stand;Min guard   Toilet Transfer: Economist and Hygiene: Sit to/from stand;Min guard       Functional mobility during ADLs: Min guard;Rolling walker (2 wheels) General ADL Comments: Overall min guard to stand and ambualte with walker. Verbal cues for hip precautions with  sit to stands. Patient educated on use of AE for ADLs and patient demonstrated use. Patient educated on compensatory strategies and home safety.     Vision Patient Visual Report: No change from baseline       Perception     Praxis      Pertinent Vitals/Pain Pain Assessment Pain Assessment: Faces Faces Pain Scale: Hurts a little  bit Pain Location: right hip Pain Descriptors / Indicators: Sore, Aching Pain Intervention(s): Monitored during session     Hand Dominance Right   Extremity/Trunk Assessment Upper Extremity Assessment Upper Extremity Assessment: Overall WFL for tasks assessed   Lower Extremity Assessment Lower Extremity Assessment: Defer to PT evaluation   Cervical / Trunk Assessment Cervical / Trunk Assessment: Normal   Communication Communication Communication: No difficulties   Cognition Arousal/Alertness: Awake/alert Behavior During Therapy: WFL for tasks assessed/performed Overall Cognitive Status: Within Functional Limits for tasks assessed                                       General Comments       Exercises     Shoulder Instructions      Home Living Family/patient expects to be discharged to:: Private residence Living Arrangements: Alone Available Help at Discharge: Family;Available 24 hours/day Type of Home: House Home Access: Level entry     Home Layout: Two level;Able to live on main level with bedroom/bathroom Alternate Level Stairs-Number of Steps: flight Alternate Level Stairs-Rails: Right;Left           Home Equipment: Conservation officer, nature (2 wheels);BSC/3in1;Shower seat   Additional Comments: plans to stay upstairs in bedroom      Prior Functioning/Environment Prior Level of Function : Independent/Modified Independent                        OT Problem List: Decreased knowledge of use of DME or AE;Decreased knowledge of precautions;Pain;Decreased activity tolerance;Impaired balance (sitting and/or standing)      OT Treatment/Interventions: Self-care/ADL training;DME and/or AE instruction;Therapeutic activities;Balance training;Patient/family education    OT Goals(Current goals can be found in the care plan section) Acute Rehab OT Goals Patient Stated Goal: to go home today OT Goal Formulation: With patient Time For Goal  Achievement: 09/30/21 Potential to Achieve Goals: Good  OT Frequency: Min 1X/week    Co-evaluation              AM-PAC OT "6 Clicks" Daily Activity     Outcome Measure Help from another person eating meals?: None Help from another person taking care of personal grooming?: A Little Help from another person toileting, which includes using toliet, bedpan, or urinal?: A Little Help from another person bathing (including washing, rinsing, drying)?: A Little Help from another person to put on and taking off regular upper body clothing?: A Little Help from another person to put on and taking off regular lower body clothing?: A Little 6 Click Score: 19   End of Session Equipment Utilized During Treatment: Rolling walker (2 wheels);Right knee immobilizer Nurse Communication: Mobility status  Activity Tolerance: Patient tolerated treatment well Patient left: in chair;with call bell/phone within reach;with chair alarm set  OT Visit Diagnosis: Other abnormalities of gait and mobility (R26.89)                Time: 6568-1275 OT Time Calculation (min): 38 min Charges:  OT General Charges $OT Visit: 1 Visit OT  Evaluation $OT Eval Low Complexity: 1 Low OT Treatments $Self Care/Home Management : 23-37 mins  Chalet Kerwin, OTR/L [place]  Office [phone] Pager: [phone]   Lenward Chancellor 09/16/2021, 12:19 PM

## 2021-09-16 NOTE — Plan of Care (Signed)
  Problem: Activity: Goal: Ability to avoid complications of mobility impairment will improve Outcome: Progressing   Problem: Clinical Measurements: Goal: Postoperative complications will be avoided or minimized Outcome: Progressing   Problem: Pain Management: Goal: Pain level will decrease with appropriate interventions Outcome: Progressing   

## 2021-09-16 NOTE — Progress Notes (Signed)
Physical Therapy Treatment Patient Details Name: Traci Hudson MRN: [id number] DOB: 05/02/1956 Today's Date: 09/16/2021   History of Present Illness Pt is a 66 year old female s/p ORIF right periprosthetic femur fracture and Revision right THR- femoral component and head ball on 09/13/21.  PMH: R DA THA, L DA THA, HTN, SBO    PT Comments    Pt ambulated in hallway and practiced stair technique with her friend observing.  Therapist demonstrated safe technique and pt's friend positioning to safely assist pt with stairs at home.  Pt with queasy feeling upon return to room however BP WFL (see mobility section below).    Recommendations for follow up therapy are one component of a multi-disciplinary discharge planning process, led by the attending physician.  Recommendations may be updated based on patient status, additional functional criteria and insurance authorization.  Follow Up Recommendations  Follow physician's recommendations for discharge plan and follow up therapies     Assistance Recommended at Discharge Frequent or constant Supervision/Assistance  Patient can return home with the following A little help with walking and/or transfers;A little help with bathing/dressing/bathroom;Help with stairs or ramp for entrance   Equipment Recommendations  None recommended by PT    Recommendations for Other Services       Precautions / Restrictions Precautions Precautions: Fall;Posterior Hip Precaution Comments: reviewed posterior hip precautions Restrictions Weight Bearing Restrictions: No RLE Weight Bearing: Weight bearing as tolerated Other Position/Activity Restrictions: WBAT     Mobility  Bed Mobility Overal bed mobility: Needs Assistance Bed Mobility: Sit to Supine       Sit to supine: Min assist   General bed mobility comments: pt self assisted for R LE and cues for maintaining precautions, light assist to complete R LE positioning onto bed    Transfers Overall  transfer level: Needs assistance Equipment used: Rolling walker (2 wheels) Transfers: Sit to/from Stand Sit to Stand: Min guard           General transfer comment: verbal and visual cues for UE and LE positioning, maintaining precautions    Ambulation/Gait Ambulation/Gait assistance: Min guard Gait Distance (Feet): 80 Feet Assistive device: Rolling walker (2 wheels) Gait Pattern/deviations: Step-to pattern, Antalgic, Decreased stance time - right       General Gait Details: verbal cues for sequence, RW positioning, maintaining posterior hip precautions; pt denies dizziness/weakness however did report feeling queasy so obtained BP; BP 129/72 HR 77 bpm   Stairs Stairs: Yes Stairs assistance: Min guard Stair Management: Step to pattern, Forwards, One rail Left, With cane Number of Stairs: 3 General stair comments: verbal cues for sequencing, safety, pt performed twice; pt's friend present and educated on how to safely assist   Wheelchair Mobility    Modified Rankin (Stroke Patients Only)       Balance                                            Cognition Arousal/Alertness: Awake/alert Behavior During Therapy: WFL for tasks assessed/performed Overall Cognitive Status: Within Functional Limits for tasks assessed                                          Exercises      General Comments        Pertinent  Vitals/Pain Pain Assessment Pain Assessment: 0-10 Pain Score: 5  Pain Location: right hip Pain Descriptors / Indicators: Sore, Aching Pain Intervention(s): Repositioned, Monitored during session    Home Living Family/patient expects to be discharged to:: Private residence Living Arrangements: Alone Available Help at Discharge: Family;Available 24 hours/day Type of Home: House Home Access: Level entry     Alternate Level Stairs-Number of Steps: flight Home Layout: Two level;Able to live on main level with  bedroom/bathroom Home Equipment: Rolling Walker (2 wheels);BSC/3in1;Shower seat Additional Comments: plans to stay upstairs in bedroom    Prior Function            PT Goals (current goals can now be found in the care plan section) Progress towards PT goals: Progressing toward goals    Frequency    7X/week      PT Plan Current plan remains appropriate    Co-evaluation              AM-PAC PT "6 Clicks" Mobility   Outcome Measure  Help needed turning from your back to your side while in a flat bed without using bedrails?: A Little Help needed moving from lying on your back to sitting on the side of a flat bed without using bedrails?: A Little Help needed moving to and from a bed to a chair (including a wheelchair)?: A Little Help needed standing up from a chair using your arms (e.g., wheelchair or bedside chair)?: A Little Help needed to walk in hospital room?: A Little Help needed climbing 3-5 steps with a railing? : A Little 6 Click Score: 18    End of Session Equipment Utilized During Treatment: Gait belt Activity Tolerance: Patient tolerated treatment well Patient left: with call bell/phone within reach;with family/visitor present;in bed;with bed alarm set Nurse Communication: Mobility status PT Visit Diagnosis: Other abnormalities of gait and mobility (R26.89);Difficulty in walking, not elsewhere classified (R26.2)     Time: 1043-1100 PT Time Calculation (min) (ACUTE ONLY): 17 min  Charges:  $Gait Training: 8-22 mins                     Jannette Spanner PT, DPT Acute Rehabilitation Services Pager: [phone] Office: [place] 09/16/2021, 1:06 PM

## 2021-09-16 NOTE — Progress Notes (Signed)
Physical Therapy Treatment Patient Details Name: Traci Hudson MRN: [id number] DOB: 07-Apr-1956 Today's Date: 09/16/2021   History of Present Illness Pt is a 66 year old female s/p ORIF right periprosthetic femur fracture and Revision right THR- femoral component and head ball on 09/13/21.  PMH: R DA THA, L DA THA, HTN, SBO    PT Comments    Pt ambulated in hallway and practiced safe stair technique with pt's friend assisting/holding gait belt.  Pt also performed LE exercises and maintained posterior hip precautions.  Pt provided with posterior hip precautions, HEP, and stair technique handouts.  Pt had no symptoms (dizzy, nausea, queasy, etc) during session.  Pt had no further questions and ready for d/c.     Recommendations for follow up therapy are one component of a multi-disciplinary discharge planning process, led by the attending physician.  Recommendations may be updated based on patient status, additional functional criteria and insurance authorization.  Follow Up Recommendations  Follow physician's recommendations for discharge plan and follow up therapies     Assistance Recommended at Discharge Frequent or constant Supervision/Assistance  Patient can return home with the following A little help with walking and/or transfers;A little help with bathing/dressing/bathroom;Help with stairs or ramp for entrance   Equipment Recommendations  None recommended by PT    Recommendations for Other Services       Precautions / Restrictions Precautions Precautions: Fall;Posterior Hip Precaution Comments: reviewed posterior hip precautions Restrictions Weight Bearing Restrictions: No RLE Weight Bearing: Weight bearing as tolerated Other Position/Activity Restrictions: WBAT     Mobility  Bed Mobility Overal bed mobility: Needs Assistance Bed Mobility: Supine to Sit, Sit to Supine     Supine to sit: Min guard, HOB elevated Sit to supine: Min guard, HOB elevated   General bed  mobility comments: pt self assisted for R LE and cues for maintaining precaution    Transfers Overall transfer level: Needs assistance Equipment used: Rolling walker (2 wheels) Transfers: Sit to/from Stand Sit to Stand: Min guard           General transfer comment: verbal and visual cues for UE and LE positioning, maintaining precautions    Ambulation/Gait Ambulation/Gait assistance: Min guard Gait Distance (Feet): 60 Feet Assistive device: Rolling walker (2 wheels) Gait Pattern/deviations: Step-to pattern, Antalgic, Decreased stance time - right       General Gait Details: verbal cues for sequence, RW positioning, posture; pt denies dizziness   Stairs Stairs: Yes Stairs assistance: Min guard Stair Management: Step to pattern, Forwards, One rail Left, With cane Number of Stairs: 3 General stair comments: verbal cues for sequencing, safety; pt performed twice with pt's friend assisting   Wheelchair Mobility    Modified Rankin (Stroke Patients Only)       Balance                                            Cognition Arousal/Alertness: Awake/alert Behavior During Therapy: WFL for tasks assessed/performed Overall Cognitive Status: Within Functional Limits for tasks assessed                                          Exercises Total Joint Exercises Ankle Circles/Pumps: AROM, Both, 10 reps Quad Sets: AROM, Both, 10 reps Short Arc Quad: Right, 10 reps,  AAROM Heel Slides: AAROM, Right, 10 reps, Other (comment) (all exercises within precautions) Hip ABduction/ADduction: Right, 10 reps, AAROM Long Arc Quad: AROM, Right, 10 reps, Seated    General Comments        Pertinent Vitals/Pain Pain Assessment Pain Assessment: 0-10 Pain Score: 5  Pain Location: right hip Pain Descriptors / Indicators: Sore, Aching Pain Intervention(s): Repositioned, Monitored during session    Home Living Family/patient expects to be discharged  to:: Private residence Living Arrangements: Alone Available Help at Discharge: Family;Available 24 hours/day Type of Home: House Home Access: Level entry     Alternate Level Stairs-Number of Steps: flight Home Layout: Two level;Able to live on main level with bedroom/bathroom Home Equipment: Rolling Walker (2 wheels);BSC/3in1;Shower seat Additional Comments: plans to stay upstairs in bedroom    Prior Function            PT Goals (current goals can now be found in the care plan section) Progress towards PT goals: Progressing toward goals    Frequency    7X/week      PT Plan Current plan remains appropriate    Co-evaluation              AM-PAC PT "6 Clicks" Mobility   Outcome Measure  Help needed turning from your back to your side while in a flat bed without using bedrails?: A Little Help needed moving from lying on your back to sitting on the side of a flat bed without using bedrails?: A Little Help needed moving to and from a bed to a chair (including a wheelchair)?: A Little Help needed standing up from a chair using your arms (e.g., wheelchair or bedside chair)?: A Little Help needed to walk in hospital room?: A Little Help needed climbing 3-5 steps with a railing? : A Little 6 Click Score: 18    End of Session Equipment Utilized During Treatment: Gait belt Activity Tolerance: Patient tolerated treatment well Patient left: with call bell/phone within reach;with family/visitor present;in bed;with bed alarm set Nurse Communication: Mobility status PT Visit Diagnosis: Other abnormalities of gait and mobility (R26.89);Difficulty in walking, not elsewhere classified (R26.2)     Time: 2446-2863 PT Time Calculation (min) (ACUTE ONLY): 30 min  Charges:  $Gait Training: 8-22 mins $Therapeutic Exercise: 8-22 mins           Jannette Spanner PT, DPT Acute Rehabilitation Services Pager: [phone] Office: [place] 09/16/2021, 2:59 PM

## 2021-09-23 NOTE — Discharge Summary (Signed)
Physician Discharge Summary   Patient ID: Traci Hudson MRN: [id number] DOB/AGE: 1955/12/20 66 y.o.  Admit date: 09/13/2021 Discharge date: 09/16/2021  Primary Diagnosis: Right periprosthetic femur fracture with loose femoral component.  Admission Diagnoses:  Past Medical History:  Diagnosis Date   Arthritis    Asthma    seasonal   Depression    Diverticulitis    Dyspnea    With asthma flares   GERD (gastroesophageal reflux disease)    Hypercholesteremia    Hypertension    Insomnia    Pre-diabetes    Skin cancer    Discharge Diagnoses:   Principal Problem:   S/P revision of right total hip  Estimated body mass index is 25.29 kg/m as calculated from the following:   Height as of this encounter: 5\' 5"  (1.651 m).   Weight as of this encounter: 68.9 kg.  Procedure:  Procedure(s) (LRB): Revision Right femoral component posterior approach (Right)   Consults: None  HPI: The patient is a very pleasant 66 year old female who underwent a right total hip arthroplasty approximately 2 months ago.  When she came back into the office for her 6-week radiographic followup, she was noted to have a  fracture involving the medial aspect of the proximal femur about 3 cm below the lesser trochanter.  With this, there was evidence of femoral component subsidence.  Based on these findings, we had a lengthy discussion how to proceed.  One option was to  limit weightbearing and allow the fracture to heal and allow the femoral component to remain stable.  The consequence of this would be resultant shortening to the right lower extremity.  The other option was to proceed with a revision surgery and to fix  the fracture for more stability and confidence.  After the discussion and a short period of time for her to think about it over a weekend, she contacted the office and wished to proceed with surgery.  We had reviewed the risks and benefits of the  procedure as noted.  Consent was obtained for  benefit of pain relief as well as management of her fracture.  Laboratory Data: Admission on 09/13/2021, Discharged on 09/16/2021  Component Date Value Ref Range Status   WBC 09/14/2021 13.7 (H)  4.0 - 10.5 K/uL Final   RBC 09/14/2021 3.62 (L)  3.87 - 5.11 MIL/uL Final   Hemoglobin 09/14/2021 10.2 (L)  12.0 - 15.0 g/dL Final   HCT 09/14/2021 30.4 (L)  36.0 - 46.0 % Final   MCV 09/14/2021 84.0  80.0 - 100.0 fL Final   MCH 09/14/2021 28.2  26.0 - 34.0 pg Final   MCHC 09/14/2021 33.6  30.0 - 36.0 g/dL Final   RDW 09/14/2021 13.2  11.5 - 15.5 % Final   Platelets 09/14/2021 197  150 - 400 K/uL Final   nRBC 09/14/2021 0.0  0.0 - 0.2 % Final   Performed at [facility], [place] [address], [place], [place] [address]   Sodium 09/14/2021 132 (L)  135 - 145 mmol/L Final   Potassium 09/14/2021 4.2  3.5 - 5.1 mmol/L Final   Chloride 09/14/2021 99  98 - 111 mmol/L Final   CO2 09/14/2021 26  22 - 32 mmol/L Final   Glucose, Bld 09/14/2021 141 (H)  70 - 99 mg/dL Final   Glucose reference range applies only to samples taken after fasting for at least 8 hours.   BUN 09/14/2021 10  8 - 23 mg/dL Final   Creatinine, Ser 09/14/2021 0.60  0.44 - 1.00 mg/dL Final   Calcium 09/14/2021 8.7 (L)  8.9 - 10.3 mg/dL Final   GFR, Estimated 09/14/2021 >60  >60 mL/min Final   Comment: (NOTE) Calculated using the CKD-EPI Creatinine Equation (2021)    Anion gap 09/14/2021 7  5 - 15 Final   Performed at [facility], [place] [address], [place], [place] [address]   WBC 09/15/2021 12.4 (H)  4.0 - 10.5 K/uL Final   RBC 09/15/2021 3.14 (L)  3.87 - 5.11 MIL/uL Final   Hemoglobin 09/15/2021 8.8 (L)  12.0 - 15.0 g/dL Final   HCT 09/15/2021 26.4 (L)  36.0 - 46.0 % Final   MCV 09/15/2021 84.1  80.0 - 100.0 fL Final   MCH 09/15/2021 28.0  26.0 - 34.0 pg Final   MCHC 09/15/2021 33.3  30.0 - 36.0 g/dL Final   RDW 09/15/2021 13.4  11.5 - 15.5 % Final   Platelets 09/15/2021 211  150 - 400 K/uL  Final   nRBC 09/15/2021 0.0  0.0 - 0.2 % Final   Performed at [facility], [place] [address], [place], [place] [address]   WBC 09/16/2021 8.7  4.0 - 10.5 K/uL Final   RBC 09/16/2021 2.92 (L)  3.87 - 5.11 MIL/uL Final   Hemoglobin 09/16/2021 8.2 (L)  12.0 - 15.0 g/dL Final   HCT 09/16/2021 24.6 (L)  36.0 - 46.0 % Final   MCV 09/16/2021 84.2  80.0 - 100.0 fL Final   MCH 09/16/2021 28.1  26.0 - 34.0 pg Final   MCHC 09/16/2021 33.3  30.0 - 36.0 g/dL Final   RDW 09/16/2021 13.6  11.5 - 15.5 % Final   Platelets 09/16/2021 150  150 - 400 K/uL Final   nRBC 09/16/2021 0.0  0.0 - 0.2 % Final   Performed at [facility], [place] [address] [place], [place] [address]  Hospital Outpatient Visit on 09/10/2021  Component Date Value Ref Range Status   Sodium 09/10/2021 132 (L)  135 - 145 mmol/L Final   Potassium 09/10/2021 4.2  3.5 - 5.1 mmol/L Final   Chloride 09/10/2021 100  98 - 111 mmol/L Final   CO2 09/10/2021 25  22 - 32 mmol/L Final   Glucose, Bld 09/10/2021 94  70 - 99 mg/dL Final   Glucose reference range applies only to samples taken after fasting for at least 8 hours.   BUN 09/10/2021 13  8 - 23 mg/dL Final   Creatinine, Ser 09/10/2021 0.72  0.44 - 1.00 mg/dL Final   Calcium 09/10/2021 9.4  8.9 - 10.3 mg/dL Final   GFR, Estimated 09/10/2021 >60  >60 mL/min Final   Comment: (NOTE) Calculated using the CKD-EPI Creatinine Equation (2021)    Anion gap 09/10/2021 7  5 - 15 Final   Performed at [facility], [place] [address] [place], [place] [address]   ABO/RH(D) 09/10/2021 A POS   Final   Antibody Screen 09/10/2021 NEG   Final   Sample Expiration 09/10/2021 09/16/2021,2359   Final   Extend sample reason 09/10/2021    Final                   Value:NO TRANSFUSIONS OR PREGNANCY IN THE PAST 3 MONTHS Performed at [place] [address] [place], [place] [address]    MRSA, PCR 09/10/2021 NEGATIVE  NEGATIVE Final    Staphylococcus aureus 09/10/2021 NEGATIVE  NEGATIVE Final   Comment: (NOTE) The Xpert SA Assay (FDA approved for  NASAL specimens in patients 58 years of age and older), is one component of a comprehensive surveillance program. It is not intended to diagnose infection nor to guide or monitor treatment. Performed at [facility], [place] [address], [place], [place] [address]   Hospital Outpatient Visit on 09/10/2021  Component Date Value Ref Range Status   SARS Coronavirus 2 09/10/2021 NEGATIVE  NEGATIVE Final   Comment: (NOTE) SARS-CoV-2 target nucleic acids are NOT DETECTED.  The SARS-CoV-2 RNA is generally detectable in upper and lower respiratory specimens during the acute phase of infection. Negative results do not preclude SARS-CoV-2 infection, do not rule out co-infections with other pathogens, and should not be used as the sole basis for treatment or other patient management decisions. Negative results must be combined with clinical observations, patient history, and epidemiological information. The expected result is Negative.  Fact Sheet for Patients: SugarRoll.be  Fact Sheet for Healthcare Providers: https://www.woods-mathews.com/  This test is not yet approved or cleared by the [place] FDA and  has been authorized for detection and/or diagnosis of SARS-CoV-2 by FDA under an Emergency Use Authorization (EUA). This EUA will remain  in effect (meaning this test can be used) for the duration of the COVID-19 declaration under Se                          ction 564(b)(1) of the Act, 21 U.S.C. section 360bbb-3(b)(1), unless the authorization is terminated or revoked sooner.  Performed at [facility], [place] [address] [place], [place] [address]    Glucose-Capillary 09/10/2021 110 (H)  70 - 99 mg/dL Final   Glucose reference range applies only to samples taken after fasting for at least 8 hours.  Admission  on 08/03/2021, Discharged on 08/04/2021  Component Date Value Ref Range Status   Glucose-Capillary 08/03/2021 145 (H)  70 - 99 mg/dL Final   Glucose reference range applies only to samples taken after fasting for at least 8 hours.   Comment 1 08/03/2021 Notify RN   Final   Comment 2 08/03/2021 Document in Chart   Final   Glucose-Capillary 08/03/2021 96  70 - 99 mg/dL Final   Glucose reference range applies only to samples taken after fasting for at least 8 hours.   WBC 08/04/2021 10.9 (H)  4.0 - 10.5 K/uL Final   RBC 08/04/2021 4.12  3.87 - 5.11 MIL/uL Final   Hemoglobin 08/04/2021 11.7 (L)  12.0 - 15.0 g/dL Final   HCT 08/04/2021 34.5 (L)  36.0 - 46.0 % Final   MCV 08/04/2021 83.7  80.0 - 100.0 fL Final   MCH 08/04/2021 28.4  26.0 - 34.0 pg Final   MCHC 08/04/2021 33.9  30.0 - 36.0 g/dL Final   RDW 08/04/2021 12.9  11.5 - 15.5 % Final   Platelets 08/04/2021 169  150 - 400 K/uL Final   nRBC 08/04/2021 0.0  0.0 - 0.2 % Final   Performed at [facility], [place] [address], [place], [place] [address]   Sodium 08/04/2021 131 (L)  135 - 145 mmol/L Final   Potassium 08/04/2021 3.8  3.5 - 5.1 mmol/L Final   Chloride 08/04/2021 102  98 - 111 mmol/L Final   CO2 08/04/2021 23  22 - 32 mmol/L Final   Glucose, Bld 08/04/2021 145 (H)  70 - 99 mg/dL Final   Glucose reference range applies only to samples taken after fasting for at least 8 hours.   BUN  08/04/2021 8  8 - 23 mg/dL Final   Creatinine, Ser 08/04/2021 0.81  0.44 - 1.00 mg/dL Final   Calcium 08/04/2021 8.4 (L)  8.9 - 10.3 mg/dL Final   GFR, Estimated 08/04/2021 >60  >60 mL/min Final   Comment: (NOTE) Calculated using the CKD-EPI Creatinine Equation (2021)    Anion gap 08/04/2021 6  5 - 15 Final   Performed at [facility], [place] [address], [place], [place] [address]  Orders Only on 07/30/2021  Component Date Value Ref Range Status   SARS Coronavirus 2 07/30/2021 RESULT: NEGATIVE   Final   Comment:  RESULT: NEGATIVESARS-CoV-2 INTERPRETATION:A NEGATIVE  test result means that SARS-CoV-2 RNA was not present in the specimen above the limit of detection of this test. This does not preclude a possible SARS-CoV-2 infection and should not be used as the  sole basis for patient management decisions. Negative results must be combined with clinical observations, patient history, and epidemiological information. Optimum specimen types and timing for peak viral levels during infections caused by SARS-CoV-2  have not been determined. Collection of multiple specimens or types of specimens may be necessary to detect virus. Improper specimen collection and handling, sequence variability under primers/probes, or organism present below the limit of detection may  lead to false negative results. Positive and negative predictive values of testing are highly dependent on prevalence. False negative test results are more likely when prevalence of disease is high.The expected result is NEGATIVE.Fact S                          heet for  Healthcare Providers: LocalChronicle.no Sheet for Patients: SalonLookup.es Reference Range - Negative   Hospital Outpatient Visit on 07/26/2021  Component Date Value Ref Range Status   MRSA, PCR 07/26/2021 NEGATIVE  NEGATIVE Final   Staphylococcus aureus 07/26/2021 NEGATIVE  NEGATIVE Final   Comment: (NOTE) The Xpert SA Assay (FDA approved for NASAL specimens in patients 49 years of age and older), is one component of a comprehensive surveillance program. It is not intended to diagnose infection nor to guide or monitor treatment. Performed at [facility], [place] [address] [place], [place] [address]    WBC 07/26/2021 5.5  4.0 - 10.5 K/uL Final   RBC 07/26/2021 5.18 (H)  3.87 - 5.11 MIL/uL Final   Hemoglobin 07/26/2021 14.5  12.0 - 15.0 g/dL Final   HCT 07/26/2021 43.6  36.0 - 46.0 % Final   MCV  07/26/2021 84.2  80.0 - 100.0 fL Final   MCH 07/26/2021 28.0  26.0 - 34.0 pg Final   MCHC 07/26/2021 33.3  30.0 - 36.0 g/dL Final   RDW 07/26/2021 12.9  11.5 - 15.5 % Final   Platelets 07/26/2021 250  150 - 400 K/uL Final   nRBC 07/26/2021 0.0  0.0 - 0.2 % Final   Performed at [facility], [place] [address], [place], [place] [address]   Sodium 07/26/2021 135  135 - 145 mmol/L Final   Potassium 07/26/2021 4.3  3.5 - 5.1 mmol/L Final   Chloride 07/26/2021 105  98 - 111 mmol/L Final   CO2 07/26/2021 24  22 - 32 mmol/L Final   Glucose, Bld 07/26/2021 105 (H)  70 - 99 mg/dL Final   Glucose reference range applies only to samples taken after fasting for at least 8 hours.   BUN 07/26/2021 13  8 - 23 mg/dL Final   Creatinine, Ser 07/26/2021 0.74  0.44 - 1.00 mg/dL Final   Calcium 07/26/2021 9.2  8.9 - 10.3 mg/dL Final   Total Protein 07/26/2021 8.0  6.5 - 8.1 g/dL Final   Albumin 07/26/2021 4.6  3.5 - 5.0 g/dL Final   AST 07/26/2021 29  15 - 41 U/L Final   ALT 07/26/2021 28  0 - 44 U/L Final   Alkaline Phosphatase 07/26/2021 63  38 - 126 U/L Final   Total Bilirubin 07/26/2021 0.6  0.3 - 1.2 mg/dL Final   GFR, Estimated 07/26/2021 >60  >60 mL/min Final   Comment: (NOTE) Calculated using the CKD-EPI Creatinine Equation (2021)    Anion gap 07/26/2021 6  5 - 15 Final   Performed at [facility], [place] [address] [place], [place] [address]   ABO/RH(D) 07/26/2021 A POS   Final   Antibody Screen 07/26/2021 NEG   Final   Sample Expiration 07/26/2021 08/06/2021,2359   Final   Extend sample reason 07/26/2021    Final                   Value:NO TRANSFUSIONS OR PREGNANCY IN THE PAST 3 MONTHS Performed at [place] [address] [place], [place] [address]    Hgb A1c MFr Bld 07/26/2021 6.0 (H)  4.8 - 5.6 % Final   Comment: (NOTE) Pre diabetes:          5.7%-6.4%  Diabetes:              >6.4%  Glycemic control for   <7.0% adults with  diabetes    Mean Plasma Glucose 07/26/2021 125.5  mg/dL Final   Performed at [facility], [place] [address] [place], [place] [address]   Glucose-Capillary 07/26/2021 99  70 - 99 mg/dL Final   Glucose reference range applies only to samples taken after fasting for at least 8 hours.     X-Rays:DG Pelvis Portable  Result Date: 09/13/2021 CLINICAL DATA:  Right hip prosthesis revision EXAM: PORTABLE PELVIS 1-2 VIEWS COMPARISON:  08/03/2021 FINDINGS: Two frontal views of the pelvis are obtained. Left hip arthroplasty is unchanged. Interval revision of the femoral component of the right hip arthroplasty, with long stem component and proximal cerclage wire surrounding the proximal femoral diaphysis. Alignment is anatomic. Postsurgical changes are seen in the overlying soft tissues. IMPRESSION: 1. Interval revision of right hip arthroplasty as above. Electronically Signed   By: Randa Ngo M.D.   On: 09/13/2021 17:51    EKG: Orders placed or performed during the hospital encounter of 07/26/21   EKG 12 lead per protocol   EKG 12 lead per protocol     Hospital Course: Traci Hudson is a 66 y.o. who was admitted to [facility]. They were brought to the operating room on 09/13/2021 and underwent Procedure(s): Revision Right femoral component posterior approach.  Patient tolerated the procedure well and was later transferred to the recovery room and then to the orthopaedic floor for postoperative care. They were given PO and IV analgesics for pain control following their surgery. They were given 24 hours of postoperative antibiotics of  Anti-infectives (From admission, onward)    Start     Dose/Rate Route Frequency Ordered Stop   09/13/21 2000  ceFAZolin (ANCEF) IVPB 2g/100 mL premix        2 g 200 mL/hr over 30 Minutes Intravenous Every 6 hours 09/13/21 1830 09/14/21 0229   09/13/21 1403  ceFAZolin (ANCEF) 2-4 GM/100ML-% IVPB  Note to Pharmacy: [place] [facility]: cabinet  override      09/13/21 1403 09/14/21 0214      and started on DVT prophylaxis in the form of Aspirin.   PT and OT were ordered for total joint protocol. Discharge planning consulted to help with postop disposition and equipment needs. Patient had a good night on the evening of surgery. They started to get up OOB with therapy on POD #0. She worked with PT on POD #1 but was limited by pain.  Continued to work with therapy into POD #2. Pt was seen during rounds on day two and was ready to go home pending progress with therapy. Pt worked with therapy for two additional sessions and was meeting their goals. She was discharged to home later that day in stable condition.  Diet: Regular diet Activity: WBAT Follow-up: in 2 weeks Disposition: Home Discharged Condition: good   Discharge Instructions     Call MD / Call 911   Complete by: As directed    If you experience chest pain or shortness of breath, CALL 911 and be transported to the hospital emergency room.  If you develope a fever above 101 F, pus (white drainage) or increased drainage or redness at the wound, or calf pain, call your surgeon's office.   Change dressing   Complete by: As directed    Maintain surgical dressing until follow up in the clinic. If the edges start to pull up, may reinforce with tape. If the dressing is no longer working, may remove and cover with gauze and tape, but must keep the area dry and clean.  Call with any questions or concerns.   Constipation Prevention   Complete by: As directed    Drink plenty of fluids.  Prune juice may be helpful.  You may use a stool softener, such as Colace (over the counter) 100 mg twice a day.  Use MiraLax (over the counter) for constipation as needed.   Diet - low sodium heart healthy   Complete by: As directed    Increase activity slowly as tolerated   Complete by: As directed    Weight bearing as tolerated with assist device (walker, cane, etc) as directed, use it as long as  suggested by your surgeon or therapist, typically at least 4-6 weeks.   Post-operative opioid taper instructions:   Complete by: As directed    POST-OPERATIVE OPIOID TAPER INSTRUCTIONS: It is important to wean off of your opioid medication as soon as possible. If you do not need pain medication after your surgery it is ok to stop day one. Opioids include: Codeine, Hydrocodone(Norco, Vicodin), Oxycodone(Percocet, oxycontin) and hydromorphone amongst others.  Long term and even short term use of opiods can cause: Increased pain response Dependence Constipation Depression Respiratory depression And more.  Withdrawal symptoms can include Flu like symptoms Nausea, vomiting And more Techniques to manage these symptoms Hydrate well Eat regular healthy meals Stay active Use relaxation techniques(deep breathing, meditating, yoga) Do Not substitute Alcohol to help with tapering If you have been on opioids for less than two weeks and do not have pain than it is ok to stop all together.  Plan to wean off of opioids This plan should start within one week post op of your joint replacement. Maintain the same interval or time between taking each dose and first decrease the dose.  Cut the total daily intake of opioids by one tablet each day Next start to increase the time between doses. The last  dose that should be eliminated is the evening dose.      TED hose   Complete by: As directed    Use stockings (TED hose) for 2 weeks on both leg(s).  You may remove them at night for sleeping.      Allergies as of 09/16/2021       Reactions   Codeine Itching   Sulfa Antibiotics    GI upset    Tape Itching, Rash        Medication List     STOP taking these medications    HYDROcodone-acetaminophen 5-325 MG tablet Commonly known as: NORCO/VICODIN Replaced by: HYDROcodone-acetaminophen 7.5-325 MG tablet   methocarbamol 500 MG tablet Commonly known as: ROBAXIN       TAKE these  medications    albuterol 108 (90 Base) MCG/ACT inhaler Commonly known as: VENTOLIN HFA Inhale 2 puffs into the lungs every 6 (six) hours as needed for wheezing or shortness of breath.   amoxicillin 500 MG capsule Commonly known as: AMOXIL Take 2,000 mg by mouth See admin instructions. Take 2000 mg 1 hour prior to dental work   aspirin 81 MG chewable tablet Chew 1 tablet (81 mg total) by mouth 2 (two) times daily for 28 days.   atorvastatin 10 MG tablet Commonly known as: LIPITOR Take 10 mg by mouth daily in the afternoon.   celecoxib 200 MG capsule Commonly known as: CELEBREX Take 200 mg by mouth 2 (two) times daily.   cyclobenzaprine 5 MG tablet Commonly known as: FLEXERIL Take 5 mg by mouth 3 (three) times daily as needed for muscle spasms.   docusate sodium 100 MG capsule Commonly known as: COLACE Take 100 mg by mouth 2 (two) times daily as needed for mild constipation.   EPINEPHrine 0.3 mg/0.3 mL Soaj injection Commonly known as: EPI-PEN Inject 0.3 mg into the muscle as needed for anaphylaxis.   ferrous sulfate 325 (65 FE) MG tablet Commonly known as: FerrouSul Take 1 tablet (325 mg total) by mouth 3 (three) times daily with meals for 14 days. What changed: when to take this   fluticasone 50 MCG/ACT nasal spray Commonly known as: FLONASE Place 2 sprays into both nostrils 3 (three) times daily as needed for allergies.   HYDROcodone-acetaminophen 7.5-325 MG tablet Commonly known as: NORCO Take 1-2 tablets by mouth every 6 (six) hours as needed for severe pain (pain score 7-10). Replaces: HYDROcodone-acetaminophen 5-325 MG tablet   ipratropium 0.06 % nasal spray Commonly known as: ATROVENT Place 2 sprays into both nostrils 3 (three) times daily as needed (allergies).   ketotifen 0.025 % ophthalmic solution Commonly known as: ZADITOR Place 1 drop into both eyes daily as needed (allergies).   omeprazole 20 MG capsule Commonly known as: PRILOSEC Take 20 mg by  mouth daily as needed (acid reflux).   polyethylene glycol 17 g packet Commonly known as: MIRALAX / GLYCOLAX Take 1 packet by mouth daily as needed for moderate constipation.   sertraline 100 MG tablet Commonly known as: ZOLOFT Take 150 mg by mouth daily.   valACYclovir 1000 MG tablet Commonly known as: VALTREX Take 2,000 mg by mouth every 12 (twelve) hours as needed (cold sores).   zolpidem 5 MG tablet Commonly known as: AMBIEN Take 5 mg by mouth at bedtime.               Discharge Care Instructions  (From admission, onward)           Start     Ordered  09/16/21 0000  Change dressing       Comments: Maintain surgical dressing until follow up in the clinic. If the edges start to pull up, may reinforce with tape. If the dressing is no longer working, may remove and cover with gauze and tape, but must keep the area dry and clean.  Call with any questions or concerns.   09/16/21 1420            Follow-up Information     Paralee Cancel, MD. Schedule an appointment as soon as possible for a visit in 2 week(s).   Specialty: Orthopedic Surgery Contact information: [address] [place] [address] [phone]                 Signed: Griffith Citron, PA-C Orthopedic Surgery 09/23/2021, 10:00 AM

## 2021-09-30 DIAGNOSIS — Z4789 Encounter for other orthopedic aftercare: Secondary | ICD-10-CM | POA: Diagnosis not present

## 2021-10-07 DIAGNOSIS — M25551 Pain in right hip: Secondary | ICD-10-CM | POA: Diagnosis not present

## 2021-10-13 DIAGNOSIS — M25551 Pain in right hip: Secondary | ICD-10-CM | POA: Diagnosis not present

## 2021-10-20 DIAGNOSIS — Z96641 Presence of right artificial hip joint: Secondary | ICD-10-CM | POA: Diagnosis not present

## 2021-10-21 DIAGNOSIS — M25551 Pain in right hip: Secondary | ICD-10-CM | POA: Diagnosis not present

## 2021-10-28 DIAGNOSIS — M25551 Pain in right hip: Secondary | ICD-10-CM | POA: Diagnosis not present

## 2021-11-04 DIAGNOSIS — M25551 Pain in right hip: Secondary | ICD-10-CM | POA: Diagnosis not present

## 2021-11-17 DIAGNOSIS — L309 Dermatitis, unspecified: Secondary | ICD-10-CM | POA: Diagnosis not present

## 2021-11-17 DIAGNOSIS — M25559 Pain in unspecified hip: Secondary | ICD-10-CM | POA: Diagnosis not present

## 2021-12-01 DIAGNOSIS — Z4789 Encounter for other orthopedic aftercare: Secondary | ICD-10-CM | POA: Diagnosis not present

## 2021-12-15 ENCOUNTER — Other Ambulatory Visit: Payer: Self-pay

## 2021-12-15 ENCOUNTER — Inpatient Hospital Stay (HOSPITAL_BASED_OUTPATIENT_CLINIC_OR_DEPARTMENT_OTHER)
Admission: EM | Admit: 2021-12-15 | Discharge: 2021-12-18 | DRG: 391 | Disposition: A | Discharge status: Home / Self Care | Payer: Medicare Other | Attending: Family Medicine | Admitting: Family Medicine

## 2021-12-15 ENCOUNTER — Emergency Department (HOSPITAL_BASED_OUTPATIENT_CLINIC_OR_DEPARTMENT_OTHER): Payer: Medicare Other

## 2021-12-15 ENCOUNTER — Encounter (HOSPITAL_BASED_OUTPATIENT_CLINIC_OR_DEPARTMENT_OTHER): Payer: Self-pay | Admitting: Emergency Medicine

## 2021-12-15 DIAGNOSIS — K529 Noninfective gastroenteritis and colitis, unspecified: Secondary | ICD-10-CM | POA: Diagnosis present

## 2021-12-15 DIAGNOSIS — K5792 Diverticulitis of intestine, part unspecified, without perforation or abscess without bleeding: Secondary | ICD-10-CM | POA: Diagnosis not present

## 2021-12-15 DIAGNOSIS — K5732 Diverticulitis of large intestine without perforation or abscess without bleeding: Secondary | ICD-10-CM | POA: Diagnosis not present

## 2021-12-15 DIAGNOSIS — J45909 Unspecified asthma, uncomplicated: Secondary | ICD-10-CM | POA: Diagnosis present

## 2021-12-15 DIAGNOSIS — I10 Essential (primary) hypertension: Secondary | ICD-10-CM | POA: Diagnosis present

## 2021-12-15 DIAGNOSIS — Z96643 Presence of artificial hip joint, bilateral: Secondary | ICD-10-CM | POA: Diagnosis present

## 2021-12-15 DIAGNOSIS — R1031 Right lower quadrant pain: Secondary | ICD-10-CM | POA: Diagnosis not present

## 2021-12-15 DIAGNOSIS — M1990 Unspecified osteoarthritis, unspecified site: Secondary | ICD-10-CM | POA: Diagnosis not present

## 2021-12-15 DIAGNOSIS — Z885 Allergy status to narcotic agent status: Secondary | ICD-10-CM | POA: Diagnosis not present

## 2021-12-15 DIAGNOSIS — Z825 Family history of asthma and other chronic lower respiratory diseases: Secondary | ICD-10-CM | POA: Diagnosis not present

## 2021-12-15 DIAGNOSIS — K651 Peritoneal abscess: Secondary | ICD-10-CM | POA: Diagnosis present

## 2021-12-15 DIAGNOSIS — Z79899 Other long term (current) drug therapy: Secondary | ICD-10-CM

## 2021-12-15 DIAGNOSIS — Z20822 Contact with and (suspected) exposure to covid-19: Secondary | ICD-10-CM | POA: Diagnosis not present

## 2021-12-15 DIAGNOSIS — K219 Gastro-esophageal reflux disease without esophagitis: Secondary | ICD-10-CM | POA: Diagnosis not present

## 2021-12-15 DIAGNOSIS — R7303 Prediabetes: Secondary | ICD-10-CM | POA: Diagnosis present

## 2021-12-15 DIAGNOSIS — F32A Depression, unspecified: Secondary | ICD-10-CM | POA: Diagnosis not present

## 2021-12-15 DIAGNOSIS — G4700 Insomnia, unspecified: Secondary | ICD-10-CM | POA: Diagnosis not present

## 2021-12-15 DIAGNOSIS — E876 Hypokalemia: Secondary | ICD-10-CM | POA: Diagnosis present

## 2021-12-15 DIAGNOSIS — R1084 Generalized abdominal pain: Secondary | ICD-10-CM | POA: Diagnosis not present

## 2021-12-15 DIAGNOSIS — K5720 Diverticulitis of large intestine with perforation and abscess without bleeding: Secondary | ICD-10-CM | POA: Diagnosis not present

## 2021-12-15 DIAGNOSIS — Z85828 Personal history of other malignant neoplasm of skin: Secondary | ICD-10-CM | POA: Diagnosis not present

## 2021-12-15 DIAGNOSIS — R1110 Vomiting, unspecified: Secondary | ICD-10-CM | POA: Diagnosis not present

## 2021-12-15 DIAGNOSIS — Z882 Allergy status to sulfonamides status: Secondary | ICD-10-CM

## 2021-12-15 DIAGNOSIS — A419 Sepsis, unspecified organism: Secondary | ICD-10-CM | POA: Diagnosis not present

## 2021-12-15 DIAGNOSIS — E7800 Pure hypercholesterolemia, unspecified: Secondary | ICD-10-CM | POA: Diagnosis not present

## 2021-12-15 DIAGNOSIS — Z91048 Other nonmedicinal substance allergy status: Secondary | ICD-10-CM

## 2021-12-15 DIAGNOSIS — N739 Female pelvic inflammatory disease, unspecified: Secondary | ICD-10-CM

## 2021-12-15 DIAGNOSIS — Z90710 Acquired absence of both cervix and uterus: Secondary | ICD-10-CM | POA: Diagnosis not present

## 2021-12-15 DIAGNOSIS — R109 Unspecified abdominal pain: Secondary | ICD-10-CM | POA: Diagnosis not present

## 2021-12-15 LAB — LACTIC ACID, PLASMA
Lactic Acid, Venous: 1.4 mmol/L (ref 0.5–1.9)
Lactic Acid, Venous: 0.8 mmol/L (ref 0.5–1.9)

## 2021-12-15 LAB — URINALYSIS, ROUTINE W REFLEX MICROSCOPIC
Bilirubin Urine: NEGATIVE
Glucose, UA: NEGATIVE mg/dL
Hgb urine dipstick: NEGATIVE
Ketones, ur: NEGATIVE mg/dL
Leukocytes,Ua: NEGATIVE
Nitrite: NEGATIVE
Protein, ur: 30 mg/dL — AB
Specific Gravity, Urine: 1.04 — ABNORMAL HIGH (ref 1.005–1.030)
pH: 6.5 (ref 5.0–8.0)

## 2021-12-15 LAB — RESP PANEL BY RT-PCR (FLU A&B, COVID) ARPGX2
Influenza A by PCR: NEGATIVE
Influenza B by PCR: NEGATIVE
SARS Coronavirus 2 by RT PCR: NEGATIVE

## 2021-12-15 LAB — COMPREHENSIVE METABOLIC PANEL
ALT: 16 U/L (ref 0–44)
AST: 17 U/L (ref 15–41)
Albumin: 4.4 g/dL (ref 3.5–5.0)
Alkaline Phosphatase: 70 U/L (ref 38–126)
Anion gap: 13 (ref 5–15)
BUN: 16 mg/dL (ref 8–23)
CO2: 19 mmol/L — ABNORMAL LOW (ref 22–32)
Calcium: 9.5 mg/dL (ref 8.9–10.3)
Chloride: 101 mmol/L (ref 98–111)
Creatinine, Ser: 0.79 mg/dL (ref 0.44–1.00)
GFR, Estimated: >60 mL/min (ref >60)
Glucose, Bld: 145 mg/dL — ABNORMAL HIGH (ref 70–99)
Potassium: 3.8 mmol/L (ref 3.5–5.1)
Sodium: 133 mmol/L — ABNORMAL LOW (ref 135–145)
Total Bilirubin: 0.8 mg/dL (ref 0.3–1.2)
Total Protein: 7.2 g/dL (ref 6.5–8.1)

## 2021-12-15 LAB — CBC
HCT: 41.1 % (ref 36.0–46.0)
Hemoglobin: 13.7 g/dL (ref 12.0–15.0)
MCH: 26 pg (ref 26.0–34.0)
MCHC: 33.3 g/dL (ref 30.0–36.0)
MCV: 78.1 fL — ABNORMAL LOW (ref 80.0–100.0)
Platelets: 267 10*3/uL (ref 150–400)
RBC: 5.26 MIL/uL — ABNORMAL HIGH (ref 3.87–5.11)
RDW: 13.3 % (ref 11.5–15.5)
WBC: 20.6 10*3/uL — ABNORMAL HIGH (ref 4.0–10.5)
nRBC: 0 % (ref 0.0–0.2)

## 2021-12-15 LAB — LIPASE, BLOOD: Lipase: 12 U/L (ref 11–51)

## 2021-12-15 MED ORDER — ONDANSETRON HCL 4 MG/2ML IJ SOLN
4.0000 mg | Freq: Once | INTRAMUSCULAR | Status: AC
  Administered 2021-12-15: 4 mg via INTRAVENOUS
  Filled 2021-12-15: qty 2

## 2021-12-15 MED ORDER — HYDROMORPHONE HCL 1 MG/ML IJ SOLN
1.0000 mg | Freq: Once | INTRAMUSCULAR | Status: AC
  Administered 2021-12-15: 1 mg via INTRAVENOUS
  Filled 2021-12-15: qty 1

## 2021-12-15 MED ORDER — IOHEXOL 300 MG/ML  SOLN
80.0000 mL | Freq: Once | INTRAMUSCULAR | Status: AC | PRN
  Administered 2021-12-15: 80 mL via INTRAVENOUS

## 2021-12-15 MED ORDER — LACTATED RINGERS IV BOLUS
1000.0000 mL | Freq: Once | INTRAVENOUS | Status: AC
Start: 2021-12-15 — End: 2021-12-15
  Administered 2021-12-15: 1000 mL via INTRAVENOUS

## 2021-12-15 MED ORDER — ACETAMINOPHEN 500 MG PO TABS
ORAL_TABLET | ORAL | Status: AC
  Filled 2021-12-15: qty 2

## 2021-12-15 MED ORDER — ACETAMINOPHEN 650 MG RE SUPP
650.0000 mg | Freq: Four times a day (QID) | RECTAL | Status: DC | PRN
  Administered 2021-12-16: 650 mg via RECTAL
  Filled 2021-12-15: qty 1

## 2021-12-15 MED ORDER — LACTATED RINGERS IV SOLN: INTRAVENOUS | Status: DC

## 2021-12-15 MED ORDER — ALBUTEROL SULFATE (2.5 MG/3ML) 0.083% IN NEBU: 2.5000 mg | INHALATION_SOLUTION | Freq: Four times a day (QID) | RESPIRATORY_TRACT | Status: DC | PRN

## 2021-12-15 MED ORDER — HYDROMORPHONE HCL 1 MG/ML IJ SOLN
INTRAMUSCULAR | Status: AC
  Filled 2021-12-15: qty 1

## 2021-12-15 MED ORDER — HYDROMORPHONE HCL 1 MG/ML IJ SOLN
1.0000 mg | Freq: Once | INTRAMUSCULAR | Status: AC
  Administered 2021-12-15: 1 mg via INTRAVENOUS

## 2021-12-15 MED ORDER — ACETAMINOPHEN 325 MG PO TABS
650.0000 mg | ORAL_TABLET | Freq: Four times a day (QID) | ORAL | Status: DC | PRN
  Filled 2021-12-15: qty 2

## 2021-12-15 MED ORDER — ACETAMINOPHEN 500 MG PO TABS
1000.0000 mg | ORAL_TABLET | Freq: Once | ORAL | Status: AC
  Administered 2021-12-15: 1000 mg via ORAL

## 2021-12-15 MED ORDER — ALBUTEROL SULFATE HFA 108 (90 BASE) MCG/ACT IN AERS: 2.0000 | INHALATION_SPRAY | Freq: Four times a day (QID) | RESPIRATORY_TRACT | Status: DC | PRN

## 2021-12-15 MED ORDER — METRONIDAZOLE 500 MG/100ML IV SOLN
500.0000 mg | Freq: Once | INTRAVENOUS | Status: AC
  Administered 2021-12-15: 500 mg via INTRAVENOUS
  Filled 2021-12-15: qty 100

## 2021-12-15 MED ORDER — FENTANYL CITRATE PF 50 MCG/ML IJ SOSY
50.0000 ug | PREFILLED_SYRINGE | Freq: Once | INTRAMUSCULAR | Status: AC
  Administered 2021-12-15: 50 ug via INTRAVENOUS
  Filled 2021-12-15: qty 1

## 2021-12-15 MED ORDER — MORPHINE SULFATE (PF) 2 MG/ML IV SOLN
1.0000 mg | INTRAVENOUS | Status: DC | PRN
  Administered 2021-12-16 – 2021-12-17 (×3): 1 mg via INTRAVENOUS
  Filled 2021-12-15 (×3): qty 1

## 2021-12-15 MED ORDER — SODIUM CHLORIDE 0.9 % IV SOLN
2.0000 g | INTRAVENOUS | Status: DC
  Administered 2021-12-16 – 2021-12-18 (×3): 2 g via INTRAVENOUS
  Filled 2021-12-15 (×3): qty 20

## 2021-12-15 MED ORDER — HEPARIN SODIUM (PORCINE) 5000 UNIT/ML IJ SOLN
5000.0000 [IU] | Freq: Three times a day (TID) | INTRAMUSCULAR | Status: DC
  Filled 2021-12-15 (×4): qty 1

## 2021-12-15 MED ORDER — LACTATED RINGERS IV SOLN: INTRAVENOUS | Status: AC

## 2021-12-15 MED ORDER — METRONIDAZOLE 500 MG/100ML IV SOLN
500.0000 mg | Freq: Two times a day (BID) | INTRAVENOUS | Status: DC
  Administered 2021-12-15 – 2021-12-18 (×6): 500 mg via INTRAVENOUS
  Filled 2021-12-15 (×6): qty 100

## 2021-12-15 MED ORDER — SODIUM CHLORIDE 0.9 % IV SOLN
2.0000 g | Freq: Once | INTRAVENOUS | Status: AC
  Administered 2021-12-15: 2 g via INTRAVENOUS
  Filled 2021-12-15: qty 20

## 2021-12-15 NOTE — ED Provider Notes (Signed)
?[facility] EMERGENC[facility] DEPT ?Provider Note ? ? ?CSN: [id number] ?Arrival date & time: 12/15/21  1019 ? ?  ? ?History ? ?Chief Complaint  ?Patient presents with  ? Abdominal Pain  ? ? ?Traci Hudson is a 66 y.o. female. ? ? ?Abdominal Pain ?Associated symptoms: diarrhea, nausea and vomiting   ? ? 66 year old female with a hx significant for diverticulitis, GERD, depression, prior cholecystectomy, prior appendectomy, prior abdominal hysterectomy who presents to the ED with concern for abdominal pain. She has had inpatient hospitalization with surgical drains placed and intraabdominal fistulas previously from complications associated with diverticulitis. Last night, she developed abdominal pain, located epigastrically and radiating to her pelvis. Mild fever and chills last night. Also endorsed three episodes of NBNB emesis. Took polyethelyne glycol and had a BM this am that was watery. Tried a small amount of gingerale this am.  ? ?Home Medications ?Prior to Admission medications   ?Medication Sig Start Date End Date Taking? Authorizing Provider  ?albuterol (PROVENTIL HFA;VENTOLIN HFA) 108 (90 Base) MCG/ACT inhaler Inhale 2 puffs into the lungs every 6 (six) hours as needed for wheezing or shortness of breath.    [provider]  ?amoxicillin (AMOXIL) 500 MG capsule Take 2,000 mg by mouth See admin instructions. Take 2000 mg 1 hour prior to dental work    [provider]  ?atorvastatin (LIPITOR) 10 MG tablet Take 10 mg by mouth daily in the afternoon.    [provider]  ?celecoxib (CELEBREX) 200 MG capsule Take 200 mg by mouth 2 (two) times daily. 09/12/21   [provider]  ?cyclobenzaprine (FLEXERIL) 5 MG tablet Take 5 mg by mouth 3 (three) times daily as needed for muscle spasms. 09/12/21   [provider]  ?docusate sodium (COLACE) 100 MG capsule Take 100 mg by mouth 2 (two) times daily as needed for mild constipation. 07/29/21   [provider]   ?EPINEPHrine 0.3 mg/0.3 mL IJ SOAJ injection Inject 0.3 mg into the muscle as needed for anaphylaxis.    [provider]  ?ferrous sulfate (FERROUSUL) 325 (65 FE) MG tablet Take 1 tablet (325 mg total) by mouth 3 (three) times daily with meals for 14 days. ?Patient taking differently: Take 325 mg by mouth daily. 10/08/19 09/13/21  Danae Orleans, PA-C  ?fluticasone (FLONASE) 50 MCG/ACT nasal spray Place 2 sprays into both nostrils 3 (three) times daily as needed for allergies. 10/22/15   [provider]  ?HYDROcodone-acetaminophen ([place]) 7.5-325 MG tablet Take 1-2 tablets by mouth every 6 (six) hours as needed for severe pain (pain score 7-10). 09/16/21   Irving Copas, PA-C  ?ipratropium (ATROVENT) 0.06 % nasal spray Place 2 sprays into both nostrils 3 (three) times daily as needed (allergies).    [provider]  ?ketotifen (ZADITOR) 0.025 % ophthalmic solution Place 1 drop into both eyes daily as needed (allergies).    [provider]  ?omeprazole (PRILOSEC) 20 MG capsule Take 20 mg by mouth daily as needed (acid reflux).    [provider]  ?polyethylene glycol (MIRALAX / GLYCOLAX) 17 g packet Take 1 packet by mouth daily as needed for moderate constipation. 07/29/21   [provider]  ?sertraline (ZOLOFT) 100 MG tablet Take 150 mg by mouth daily.  11/09/15   [provider]  ?valACYclovir (VALTREX) 1000 MG tablet Take 2,000 mg by mouth every 12 (twelve) hours as needed (cold sores).    [provider]  ?zolpidem (AMBIEN) 5 MG tablet Take 5  mg by mouth at bedtime.    [provider]  ?   ? ?Allergies    ?Codeine, Sulfa antibiotics, and Tape   ? ?Review of Systems   ?Review of Systems  ?Gastrointestinal:  Positive for abdominal pain, diarrhea, nausea and vomiting.  ?All other systems reviewed and are negative. ? ?Physical Exam ?Updated Vital Signs ?BP 128/76   Pulse 92   Temp 99.5 ?F (37.5 ?C)   Resp 19   Wt 68.9 kg   SpO2 96%    BMI 25.28 kg/m?  ?Physical Exam ?Vitals and nursing note reviewed.  ?Constitutional:   ?   General: She is not in acute distress. ?   Appearance: She is well-developed.  ?HENT:  ?   Head: Normocephalic and atraumatic.  ?Eyes:  ?   Conjunctiva/sclera: Conjunctivae normal.  ?Cardiovascular:  ?   Rate and Rhythm: Normal rate and regular rhythm.  ?   Heart sounds: No murmur heard. ?Pulmonary:  ?   Effort: Pulmonary effort is normal. No respiratory distress.  ?   Breath sounds: Normal breath sounds.  ?Abdominal:  ?   Palpations: Abdomen is soft.  ?   Tenderness: There is generalized abdominal tenderness. There is guarding and rebound.  ?   Comments: Diffuse generalized abdominal tenderness, no rigidity, with guarding and rebound tenderness noted.  ?Musculoskeletal:     ?   General: No swelling.  ?   Cervical back: Neck supple.  ?Skin: ?   General: Skin is warm and dry.  ?   Capillary Refill: Capillary refill takes less than 2 seconds.  ?Neurological:  ?   Mental Status: She is alert.  ?Psychiatric:     ?   Mood and Affect: Mood normal.  ? ? ?ED Results / Procedures / Treatments   ?Labs ?(all labs ordered are listed, but only abnormal results are displayed) ?Labs Reviewed  ?COMPREHENSIVE METABOLIC PANEL - Abnormal; Notable for the following components:  ?    Result Value  ? Sodium 133 (*)   ? CO2 19 (*)   ? Glucose, Bld 145 (*)   ? All other components within normal limits  ?CBC - Abnormal; Notable for the following components:  ? WBC 20.6 (*)   ? RBC 5.26 (*)   ? MCV 78.1 (*)   ? All other components within normal limits  ?URINALYSIS, ROUTINE W REFLEX MICROSCOPIC - Abnormal; Notable for the following components:  ? Specific Gravity, Urine 1.040 (*)   ? Protein, ur 30 (*)   ? All other components within normal limits  ?RESP PANEL BY RT-PCR (FLU A&B, COVID) [id number]  ?CULTURE, BLOOD (ROUTINE X 2)  ?CULTURE, BLOOD (ROUTINE X 2)  ?URINE CULTURE  ?LIPASE, BLOOD  ?LACTIC ACID, PLASMA  ?LACTIC ACID, PLASMA  ? ? ?EKG ?EKG  Interpretation ? ?Date/Time:  Wednesday Dec 15 2021 10:40:27 EDT ?Ventricular Rate:  92 ?PR Interval:  145 ?QRS Duration: 88 ?QT Interval:  347 ?QTC Calculation: 430 ?R Axis:   59 ?Text Interpretation: Sinus rhythm Confirmed by Regan Lemming ([id number]) on 12/15/2021 11:23:40 AM ? ?Radiology ?CT ABDOMEN PELVIS W CONTRAST ? ?Result Date: 12/15/2021 ?CLINICAL DATA:  Abdominal pain, nausea, vomiting EXAM: CT ABDOMEN AND PELVIS WITH CONTRAST TECHNIQUE: Multidetector CT imaging of the abdomen and pelvis was performed using the standard protocol following bolus administration of intravenous contrast. RADIATION DOSE REDUCTION: This exam was performed according to the departmental dose-optimization program which includes automated exposure control, adjustment of the mA and/or kV according to patient size and/or  use of iterative reconstruction technique. CONTRAST:  20m OMNIPAQUE IOHEXOL 300 MG/ML  SOLN COMPARISON:  08/12/2020 FINDINGS: Lower chest: Unremarkable. Hepatobiliary: No focal abnormality is seen in the liver. Surgical clips are seen in gallbladder fossa. Prominence of extrahepatic bile ducts may be related to previous cholecystectomy. Pancreas: There is slight prominence of pancreatic duct. No focal abnormality is seen. Spleen: Spleen measures 12.9 cm in maximum diameter. Adrenals/Urinary Tract: Adrenals are unremarkable. There is no hydronephrosis. There are no renal or ureteral stones. Urinary bladder is not distended. Severe beam hardening artifacts limit evaluation of the bladder. Stomach/Bowel: Small hiatal hernia is seen. Stomach is not distended. There is mild dilation of distal small bowel loops. There is fluid in the lumen of distal small bowel loops. There is mild wall thickening in the distal small bowel loops. Appendix is not seen. There is fluid in the lumen of right colon. Multiple diverticula are seen in colon. There is wall thickening in the sigmoid colon. There is pericolic stranding adjacent to the  sigmoid colon. In image 68 of series 2, there is 1.7 cm fluid collection in the pelvis adjacent to sigmoid colon and distal small bowel loops. Vascular/Lymphatic: Unremarkable. Reproductive: Uterus is not seen. Other: Ther

## 2021-12-15 NOTE — Sepsis Progress Note (Signed)
eLink monitoring code sepsis.  

## 2021-12-15 NOTE — ED Notes (Signed)
RT note: Pt. returned from [facility], given X2 warm blankets, v/s updated. ?

## 2021-12-15 NOTE — Progress Notes (Signed)
Plan of Care Note for accepted transfer ? ? ?Patient: Traci Hudson MRN: [id number]   DOA: 12/15/2021 ? ?Facility requesting transfer: [facility] ?Requesting Provider: Dr. Armandina Gemma ?Reason for transfer: Acute diverticulitis.  ?Facility course: 66 yo F presenting abdominal pain. Imaging notable for acute sigmoid diverticulitis and enteritis. Also w/ 1.7cm fluid collection in the pelvic cavity. Vitals are stable. WBC 20k and lactic acid 1.4. Started on rocephin, flagyl, and fluids. EDP put a consult out to [facility], but has not heard back.   ? ?Plan of care: ?The patient is accepted for admission to [place]  unit, at [facility]..  ?While holding at [facility], medical decision making and orders for this patient will remain the responsibility of the EDP. Upon arrival to [facility], [facility] will assume care.  ? ?Author: ?Jonnie Finner, DO ?12/15/2021 ? ?Check [email or web address] for on-call coverage. ? ?Nursing staff, Please call [place] number on Amion as soon as patient's arrival, so appropriate admitting provider can evaluate the pt. ? ?

## 2021-12-15 NOTE — H&P (Signed)
?History and Physical  ? ? ?ELLIANA BAL Hudson:[id number] DOB: 19-Jan-1956 DOA: 12/15/2021 ? ?PCP: London Pepper, MD  ?Patient coming from: Home. ? ?Chief Complaint: Abdominal pain. ? ?HPI: Traci Hudson is a 66 y.o. female with history of recurrent sigmoid diverticulitis has had previously required drain placement presents to the ER with complaints of abdominal pain which has acutely worsened over the last 24 hours.  Pain is mostly in the left lower quadrant.  Had some nausea denies any vomiting.  Patient did try laxatives and had some stools which are loose. ? ?ED Course: In the ER CT abdomen fluids does show some inflammation around the sigmoid colon concerning for enteritis versus sigmoid diverticulitis.  There is also a 1.7 cm fluid collection in the pelvic cavity which may be reactionary to the colitis.  Patient has leukocytosis.  Started on empiric antibiotics admitted for further work-up. ? ?Review of Systems: As per HPI, rest all negative. ? ? ?Past Medical History:  ?Diagnosis Date  ? Arthritis   ? Asthma   ? seasonal  ? Depression   ? Diverticulitis   ? Dyspnea   ? With asthma flares  ? GERD (gastroesophageal reflux disease)   ? Hypercholesteremia   ? Hypertension   ? Insomnia   ? Pre-diabetes   ? Skin cancer   ? ? ?Past Surgical History:  ?Procedure Laterality Date  ? ABDOMINAL HYSTERECTOMY  1985  ? APPENDECTOMY  08/08/1996  ? BRAIN SURGERY  08/09/1967  ? BREAST REDUCTION SURGERY  08/08/1998  ? CHOLECYSTECTOMY  1998  ? FEMORAL REVISION Right 09/13/2021  ? Procedure: Revision Right femoral component posterior approach;  Surgeon: Paralee Cancel, MD;  Location: [facility] ORS;  Service: Orthopedics;  Laterality: Right;  90 mins ?Dr. Alvan Dame requests to start at 2pm!!  ? TOTAL HIP ARTHROPLASTY Left 10/08/2019  ? Procedure: TOTAL HIP ARTHROPLASTY ANTERIOR APPROACH;  Surgeon: Paralee Cancel, MD;  Location: [facility] OR[facility];  Service: Orthopedics;  Laterality: Left;  70 mins  ? TOTAL HIP ARTHROPLASTY Right 08/03/2021  ? Procedure:  TOTAL HIP ARTHROPLASTY ANTERIOR APPROACH;  Surgeon: Paralee Cancel, MD;  Location: [facility] OR[facility];  Service: Orthopedics;  Laterality: Right;  ? VESICOVAGINAL FISTULA CLOSURE W/ TAH  2017  ? ? ? reports that she has never smoked. She has never used smokeless tobacco. She reports current alcohol use of about 2.0 standard drinks per week. She reports that she does not use drugs. ? ?Allergies  ?Allergen Reactions  ? Codeine Itching  ? Sulfa Antibiotics   ?  GI upset   ? Tape Itching and Rash  ? ? ?Family History  ?Problem Relation Age of Onset  ? Emphysema Mother   ?     smoked  ? Lung cancer Mother   ?     smoked  ? ? ?Prior to Admission medications   ?Medication Sig Start Date End Date Taking? Authorizing Provider  ?albuterol (PROVENTIL HFA;VENTOLIN HFA) 108 (90 Base) MCG/ACT inhaler Inhale 2 puffs into the lungs every 6 (six) hours as needed for wheezing or shortness of breath.   Yes [provider]  ?atorvastatin (LIPITOR) 10 MG tablet Take 10 mg by mouth daily in the afternoon.   Yes [provider]  ?celecoxib (CELEBREX) 200 MG capsule Take 200 mg by mouth 2 (two) times daily. 09/12/21  Yes [provider]  ?cyclobenzaprine (FLEXERIL) 5 MG tablet Take 5 mg by mouth 3 (three) times daily as needed for muscle spasms. 09/12/21  Yes [provider]  ?docusate sodium (  COLACE) 100 MG capsule Take 100 mg by mouth 2 (two) times daily as needed for mild constipation. 07/29/21  Yes [provider]  ?fluticasone (FLONASE) 50 MCG/ACT nasal spray Place 2 sprays into both nostrils 3 (three) times daily as needed for allergies. 10/22/15  Yes [provider]  ?HYDROcodone-acetaminophen ([place]) 7.5-325 MG tablet Take 1-2 tablets by mouth every 6 (six) hours as needed for severe pain (pain score 7-10). 09/16/21  Yes Irving Copas, PA-C  ?ketotifen (ZADITOR) 0.025 % ophthalmic solution Place 1 drop into both eyes daily as needed (allergies).   Yes [provider]  ?Multiple Vitamin  (MULTIVITAMIN) tablet Take 1 tablet by mouth daily.   Yes [provider]  ?omeprazole (PRILOSEC) 20 MG capsule Take 20 mg by mouth daily as needed (acid reflux).   Yes [provider]  ?sertraline (ZOLOFT) 100 MG tablet Take 150 mg by mouth daily.  11/09/15  Yes [provider]  ?zolpidem (AMBIEN) 5 MG tablet Take 5 mg by mouth at bedtime.   Yes [provider]  ?EPINEPHrine 0.3 mg/0.3 mL IJ SOAJ injection Inject 0.3 mg into the muscle as needed for anaphylaxis.    [provider]  ?ferrous sulfate (FERROUSUL) 325 (65 FE) MG tablet Take 1 tablet (325 mg total) by mouth 3 (three) times daily with meals for 14 days. ?Patient taking differently: Take 325 mg by mouth daily. 10/08/19 09/13/21  Danae Orleans, PA-C  ?ipratropium (ATROVENT) 0.06 % nasal spray Place 2 sprays into both nostrils 3 (three) times daily as needed (allergies).    [provider]  ?polyethylene glycol (MIRALAX / GLYCOLAX) 17 g packet Take 1 packet by mouth daily as needed for moderate constipation. 07/29/21   [provider]  ?valACYclovir (VALTREX) 1000 MG tablet Take 2,000 mg by mouth every 12 (twelve) hours as needed (cold sores).    [provider]  ? ? ?Physical Exam: ?Constitutional: Moderately built and nourished. ?Vitals:  ? 12/15/21 1900 12/15/21 1930 12/15/21 2033 12/15/21 2036  ?BP: (!) 141/80 138/75 131/75 118/74  ?Pulse: 90 93 97 (!) 102  ?Resp: '15 17 14 14  '$ ?Temp:  99.6 ?F (37.6 ?C) 98.7 ?F (37.1 ?C) 98.4 ?F (36.9 ?C)  ?TempSrc:   Oral Oral  ?SpO2: 94% 92% 92% 90%  ?Weight:      ? ?Eyes: Anicteric no pallor. ?ENMT: No discharge from the ears eyes nose and mouth: ?Neck: No mass felt.  No neck rigidity. ?Respiratory: No rhonchi or crepitations. ?Cardiovascular: S1-S2 heard. ?Abdomen: Tenderness in the left lower quadrant no guarding or rigidity. ?Musculoskeletal: No edema. ?Skin: No rash. ?Neurologic: Alert awake oriented time place and person.  Moves all  extremities. ?Psychiatric: Appears normal.  Normal affect. ? ? ?Labs on Admission: I have personally reviewed following labs and imaging studies ? ?CBC: ?Recent Labs  ?Lab 12/15/21 ?1038  ?WBC 20.6*  ?HGB 13.7  ?HCT 41.1  ?MCV 78.1*  ?PLT 267  ? ?Basic Metabolic Panel: ?Recent Labs  ?Lab 12/15/21 ?1038  ?NA 133*  ?K 3.8  ?CL 101  ?CO2 19*  ?GLUCOSE 145*  ?BUN 16  ?CREATININE 0.79  ?CALCIUM 9.5  ? ?GFR: ?Estimated Creatinine Clearance: 66.6 mL/min (by C-G formula based on SCr of 0.79 mg/dL). ?Liver Function Tests: ?Recent Labs  ?Lab 12/15/21 ?1038  ?AST 17  ?ALT 16  ?ALKPHOS 70  ?BILITOT 0.8  ?PROT 7.2  ?ALBUMIN 4.4  ? ?Recent Labs  ?Lab 12/15/21 ?1038  ?LIPASE 12  ? ?No results for input(s): AMMONIA  in the last 168 hours. ?Coagulation Profile: ?No results for input(s): INR, PROTIME in the last 168 hours. ?Cardiac Enzymes: ?No results for input(s): CKTOTAL, CKMB, CKMBINDEX, TROPONINI in the last 168 hours. ?BNP (last 3 results) ?No results for input(s): PROBNP in the last 8760 hours. ?HbA1C: ?No results for input(s): HGBA1C in the last 72 hours. ?CBG: ?No results for input(s): GLUCAP in the last 168 hours. ?Lipid Profile: ?No results for input(s): CHOL, HDL, LDLCALC, TRIG, CHOLHDL, LDLDIRECT in the last 72 hours. ?Thyroid Function Tests: ?No results for input(s): TSH, T4TOTAL, FREET4, T3FREE, THYROIDAB in the last 72 hours. ?Anemia Panel: ?No results for input(s): VITAMINB12, FOLATE, FERRITIN, TIBC, IRON, RETICCTPCT in the last 72 hours. ?Urine analysis: ?   ?Component Value Date/Time  ? [place] 12/15/2021 1207  ? APPEARANCEUR CLEAR 12/15/2021 1207  ? LABSPEC 1.040 (H) 12/15/2021 1207  ? PHURINE 6.5 12/15/2021 1207  ? GLUCOSEU NEGATIVE 12/15/2021 1207  ? HGBUR NEGATIVE 12/15/2021 1207  ? BILIRUBINUR NEGATIVE 12/15/2021 1207  ? [place] NEGATIVE 12/15/2021 1207  ? PROTEINUR 30 (A) 12/15/2021 1207  ? UROBILINOGEN 0.2 08/19/2009 1923  ? NITRITE NEGATIVE 12/15/2021 1207  ? LEUKOCYTESUR NEGATIVE 12/15/2021 1207   ? ?Sepsis Labs: ?'@LABRCNTIP'$ (procalcitonin:4,lacticidven:4) ?) ?Recent Results (from the past 240 hour(s))  ?Resp Panel by RT-PCR (Flu A&B, Covid) Nasopharyngeal Swab     Status: None  ? Collection Time: 12/15/21

## 2021-12-15 NOTE — ED Triage Notes (Signed)
Pt presents with abdominal pain in epigastric and pelvic pain. Reports nausea and vomiting, started last night, hx diverticulitis.  ?

## 2021-12-16 DIAGNOSIS — K5732 Diverticulitis of large intestine without perforation or abscess without bleeding: Secondary | ICD-10-CM | POA: Diagnosis not present

## 2021-12-16 DIAGNOSIS — K5792 Diverticulitis of intestine, part unspecified, without perforation or abscess without bleeding: Secondary | ICD-10-CM | POA: Diagnosis not present

## 2021-12-16 LAB — URINE CULTURE

## 2021-12-16 LAB — COMPREHENSIVE METABOLIC PANEL
ALT: 16 U/L (ref 0–44)
AST: 17 U/L (ref 15–41)
Albumin: 3.2 g/dL — ABNORMAL LOW (ref 3.5–5.0)
Alkaline Phosphatase: 64 U/L (ref 38–126)
Anion gap: 7 (ref 5–15)
BUN: 11 mg/dL (ref 8–23)
CO2: 23 mmol/L (ref 22–32)
Calcium: 8.5 mg/dL — ABNORMAL LOW (ref 8.9–10.3)
Chloride: 104 mmol/L (ref 98–111)
Creatinine, Ser: 0.64 mg/dL (ref 0.44–1.00)
GFR, Estimated: >60 mL/min (ref >60)
Glucose, Bld: 129 mg/dL — ABNORMAL HIGH (ref 70–99)
Potassium: 3.7 mmol/L (ref 3.5–5.1)
Sodium: 134 mmol/L — ABNORMAL LOW (ref 135–145)
Total Bilirubin: 0.8 mg/dL (ref 0.3–1.2)
Total Protein: 6.3 g/dL — ABNORMAL LOW (ref 6.5–8.1)

## 2021-12-16 LAB — GLUCOSE, CAPILLARY
Glucose-Capillary: 111 mg/dL — ABNORMAL HIGH (ref 70–99)
Glucose-Capillary: 141 mg/dL — ABNORMAL HIGH (ref 70–99)
Glucose-Capillary: 142 mg/dL — ABNORMAL HIGH (ref 70–99)

## 2021-12-16 LAB — CBC
HCT: 35.7 % — ABNORMAL LOW (ref 36.0–46.0)
Hemoglobin: 11.5 g/dL — ABNORMAL LOW (ref 12.0–15.0)
MCH: 26.5 pg (ref 26.0–34.0)
MCHC: 32.2 g/dL (ref 30.0–36.0)
MCV: 82.3 fL (ref 80.0–100.0)
Platelets: 157 10*3/uL (ref 150–400)
RBC: 4.34 MIL/uL (ref 3.87–5.11)
RDW: 13.4 % (ref 11.5–15.5)
WBC: 16.8 10*3/uL — ABNORMAL HIGH (ref 4.0–10.5)
nRBC: 0 % (ref 0.0–0.2)

## 2021-12-16 LAB — HIV ANTIBODY (ROUTINE TESTING W REFLEX): HIV Screen 4th Generation wRfx: NONREACTIVE

## 2021-12-16 MED ORDER — ONDANSETRON HCL 4 MG/2ML IJ SOLN
4.0000 mg | Freq: Four times a day (QID) | INTRAMUSCULAR | Status: DC | PRN
  Administered 2021-12-16: 4 mg via INTRAVENOUS
  Filled 2021-12-16: qty 2

## 2021-12-16 MED ORDER — SERTRALINE HCL 100 MG PO TABS
150.0000 mg | ORAL_TABLET | Freq: Every day | ORAL | Status: DC
  Administered 2021-12-16 – 2021-12-18 (×3): 150 mg via ORAL
  Filled 2021-12-16 (×3): qty 1

## 2021-12-16 NOTE — Progress Notes (Signed)
[facility] ? ?PROGRESS NOTE ? ?Traci Hudson BWG:[id number] DOB: 1956/01/30 DOA: 12/15/2021 ?PCP: London Pepper, MD ? ? ?Brief HPI:   ?66 year old female with a history of recurrent sigmoid diverticulitis, had previously required drain placement came to ED with complaints of abdominal pain which acutely worsened over the past 24 hours.  In the ED CT abdomen/pelvis showed inflammation around sigmoid colon concerning for enteritis versus sigmoid diverticulitis.  There was also 1.7 cm fluid collection in the pelvic cavity which may be reactionary to the colitis.  Started on empiric antibiotics. ? ? ? ?Subjective  ? ?Patient seen and examined, feeling better today. ? ? Assessment/Plan:  ? ? ?Acute sigmoid diverticulitis ?-Clinically improved.  ?-WBC is down to 16,000 ?-Patient started on ceftriaxone, Flagyl ?-We will start clear liquid diet ? ?Recent right hip periprosthetic fracture repair ?-Continue pain medications ? ? ? ?Medications ? ?  ? heparin  5,000 Units Subcutaneous Q8H  ? ? ? Data Reviewed:  ? ?CBG: ? ?Recent Labs  ?Lab 12/16/21 ?1779  ?GLUCAP 111*  ? ? ?SpO2: 94 %  ? ? ?Vitals:  ? 12/15/21 2036 12/16/21 0048 12/16/21 0901 12/16/21 1435  ?BP: 118/74 104/70 131/64 128/85  ?Pulse: (!) 102 94 88 90  ?Resp: '14 13 17 16  '$ ?Temp: 98.4 ?F (36.9 ?C) 98.5 ?F (36.9 ?C) 98.5 ?F (36.9 ?C) 99.6 ?F (37.6 ?C)  ?TempSrc: Oral Oral Oral Oral  ?SpO2: 90% 94% 95% 94%  ?Weight:      ? ? ? ? ?Data Reviewed: ? ?Basic Metabolic Panel: ?Recent Labs  ?Lab 12/15/21 ?1038 12/16/21 ?0600  ?NA 133* 134*  ?K 3.8 3.7  ?CL 101 104  ?CO2 19* 23  ?GLUCOSE 145* 129*  ?BUN 16 11  ?CREATININE 0.79 0.64  ?CALCIUM 9.5 8.5*  ? ? ?CBC: ?Recent Labs  ?Lab 12/15/21 ?1038 12/16/21 ?0600  ?WBC 20.6* 16.8*  ?HGB 13.7 11.5*  ?HCT 41.1 35.7*  ?MCV 78.1* 82.3  ?PLT 267 157  ? ? ?LFT ?Recent Labs  ?Lab 12/15/21 ?1038 12/16/21 ?0600  ?AST 17 17  ?ALT 16 16  ?ALKPHOS 70 64  ?BILITOT 0.8 0.8  ?PROT 7.2 6.3*  ?ALBUMIN 4.4 3.2*  ? ?   ?Antibiotics: ?Anti-infectives (From admission, onward)  ? ? Start     Dose/Rate Route Frequency Ordered Stop  ? 12/16/21 1200  cefTRIAXone (ROCEPHIN) 2 g in sodium chloride 0.9 % 100 mL IVPB       ? 2 g ?200 mL/hr over 30 Minutes Intravenous Every 24 hours 12/15/21 2239    ? 12/15/21 2300  metroNIDAZOLE (FLAGYL) IVPB 500 mg       ? 500 mg ?100 mL/hr over 60 Minutes Intravenous Every 12 hours 12/15/21 2239    ? 12/15/21 1115  cefTRIAXone (ROCEPHIN) 2 g in sodium chloride 0.9 % 100 mL IVPB       ? 2 g ?200 mL/hr over 30 Minutes Intravenous  Once 12/15/21 1100 12/15/21 1323  ? 12/15/21 1115  metroNIDAZOLE (FLAGYL) IVPB 500 mg       ? 500 mg ?100 mL/hr over 60 Minutes Intravenous  Once 12/15/21 1100 12/15/21 1244  ? ?  ? ? ? ?DVT prophylaxis: Heparin ? ?Code Status: Full code ? ?Family Communication: No family at bedside ? ? ?CONSULTS  ? ? ?Objective  ? ? ?Physical Examination: ? ? ?General-appears in no acute distress ?Heart-S1-S2, regular, no murmur auscultated ?Lungs-clear to auscultation bilaterally, no wheezing or crackles auscultated ?Abdomen-soft, mild tenderness in left lower quadrant on  palpation, no organomegaly ?Extremities-no edema in the lower extremities ?Neuro-alert, oriented x3, no focal deficit noted ? ?Status is: Inpatient:   ? ? ? ?  ? ? ?Oswald Hillock ?  ?[facility] ?If 7PM-7AM, please contact night-coverage at [email or web address], ?Office  [phone] ? ? ?12/16/2021, 3:45 PM  LOS: 1 day  ? ? ? ? ? ? ? ? ? ? ?  ?

## 2021-12-17 DIAGNOSIS — E876 Hypokalemia: Secondary | ICD-10-CM | POA: Diagnosis not present

## 2021-12-17 DIAGNOSIS — K5792 Diverticulitis of intestine, part unspecified, without perforation or abscess without bleeding: Secondary | ICD-10-CM | POA: Diagnosis not present

## 2021-12-17 DIAGNOSIS — K5732 Diverticulitis of large intestine without perforation or abscess without bleeding: Secondary | ICD-10-CM | POA: Diagnosis not present

## 2021-12-17 DIAGNOSIS — R1084 Generalized abdominal pain: Secondary | ICD-10-CM | POA: Diagnosis not present

## 2021-12-17 LAB — COMPREHENSIVE METABOLIC PANEL
ALT: 13 U/L (ref 0–44)
AST: 15 U/L (ref 15–41)
Albumin: 2.9 g/dL — ABNORMAL LOW (ref 3.5–5.0)
Alkaline Phosphatase: 60 U/L (ref 38–126)
Anion gap: 7 (ref 5–15)
BUN: 5 mg/dL — ABNORMAL LOW (ref 8–23)
CO2: 25 mmol/L (ref 22–32)
Calcium: 8.3 mg/dL — ABNORMAL LOW (ref 8.9–10.3)
Chloride: 102 mmol/L (ref 98–111)
Creatinine, Ser: 0.58 mg/dL (ref 0.44–1.00)
GFR, Estimated: >60 mL/min (ref >60)
Glucose, Bld: 141 mg/dL — ABNORMAL HIGH (ref 70–99)
Potassium: 3.2 mmol/L — ABNORMAL LOW (ref 3.5–5.1)
Sodium: 134 mmol/L — ABNORMAL LOW (ref 135–145)
Total Bilirubin: 0.5 mg/dL (ref 0.3–1.2)
Total Protein: 5.9 g/dL — ABNORMAL LOW (ref 6.5–8.1)

## 2021-12-17 LAB — CBC
HCT: 33.1 % — ABNORMAL LOW (ref 36.0–46.0)
Hemoglobin: 10.7 g/dL — ABNORMAL LOW (ref 12.0–15.0)
MCH: 26.7 pg (ref 26.0–34.0)
MCHC: 32.3 g/dL (ref 30.0–36.0)
MCV: 82.5 fL (ref 80.0–100.0)
Platelets: 159 10*3/uL (ref 150–400)
RBC: 4.01 MIL/uL (ref 3.87–5.11)
RDW: 13.3 % (ref 11.5–15.5)
WBC: 12.2 10*3/uL — ABNORMAL HIGH (ref 4.0–10.5)
nRBC: 0 % (ref 0.0–0.2)

## 2021-12-17 LAB — GLUCOSE, CAPILLARY
Glucose-Capillary: 113 mg/dL — ABNORMAL HIGH (ref 70–99)
Glucose-Capillary: 101 mg/dL — ABNORMAL HIGH (ref 70–99)

## 2021-12-17 MED ORDER — POTASSIUM CHLORIDE CRYS ER 20 MEQ PO TBCR
40.0000 meq | EXTENDED_RELEASE_TABLET | Freq: Three times a day (TID) | ORAL | Status: AC
  Administered 2021-12-17 (×3): 40 meq via ORAL
  Filled 2021-12-17 (×3): qty 2

## 2021-12-17 NOTE — Progress Notes (Signed)
[facility] ? ?PROGRESS NOTE ? ?Traci Hudson NTZ:[id number] DOB: November 29, 1955 DOA: 12/15/2021 ?PCP: London Pepper, MD ? ? ?Brief HPI:   ?66 year old female with a history of recurrent sigmoid diverticulitis, had previously required drain placement came to ED with complaints of abdominal pain which acutely worsened over the past 24 hours.  In the ED CT abdomen/pelvis showed inflammation around sigmoid colon concerning for enteritis versus sigmoid diverticulitis.  There was also 1.7 cm fluid collection in the pelvic cavity which may be reactionary to the colitis.  Started on empiric antibiotics. ? ? ? ?Subjective  ? ?Patient seen and examined, diarrhea is improving. ? ? Assessment/Plan:  ? ? ?Acute sigmoid diverticulitis ?-Clinically improved.  ?-WBC is down to 12,000 ?-Patient started on ceftriaxone, Flagyl ?-We will advance diet to full liquid diet ?-Patient follows Dr. Collene Mares, GI ?-We will follow-up with Dr. Collene Mares as outpatient for further interventions including colonoscopy ? ?Recent right hip periprosthetic fracture repair ?-Continue pain medications ? ?Hypokalemia ?-Potassium is 3.2 ?-Replace potassium and follow BMP in am ? ? ? ?Medications ? ?  ? heparin  5,000 Units Subcutaneous Q8H  ? potassium chloride  40 mEq Oral TID  ? sertraline  150 mg Oral Daily  ? ? ? Data Reviewed:  ? ?CBG: ? ?Recent Labs  ?Lab 12/16/21 ?0758 12/16/21 ?6759 12/16/21 ?2342 12/17/21 ?0754  ?GLUCAP 111* 141* 142* 113*  ? ? ?SpO2: 97 %  ? ? ?Vitals:  ? 12/16/21 1932 12/16/21 2224 12/17/21 0404 12/17/21 1314  ?BP: (!) 147/90  126/72 131/83  ?Pulse: 91  89 78  ?Resp: '20  18 16  '$ ?Temp: 98 ?F (36.7 ?C)  98.3 ?F (36.8 ?C) 98.2 ?F (36.8 ?C)  ?TempSrc: Oral  Oral Oral  ?SpO2: 95%  96% 97%  ?Weight:      ?Height:  '5\' 4"'$  (1.626 m)    ? ? ? ? ?Data Reviewed: ? ?Basic Metabolic Panel: ?Recent Labs  ?Lab 12/15/21 ?1038 12/16/21 ?0600 12/17/21 ?1638  ?NA 133* 134* 134*  ?K 3.8 3.7 3.2*  ?CL 101 104 102  ?CO2 19* 23 25  ?GLUCOSE 145* 129* 141*   ?BUN 16 11 5*  ?CREATININE 0.79 0.64 0.58  ?CALCIUM 9.5 8.5* 8.3*  ? ? ?CBC: ?Recent Labs  ?Lab 12/15/21 ?1038 12/16/21 ?0600 12/17/21 ?4665  ?WBC 20.6* 16.8* 12.2*  ?HGB 13.7 11.5* 10.7*  ?HCT 41.1 35.7* 33.1*  ?MCV 78.1* 82.3 82.5  ?PLT 267 157 159  ? ? ?LFT ?Recent Labs  ?Lab 12/15/21 ?1038 12/16/21 ?0600 12/17/21 ?9935  ?AST '17 17 15  '$ ?ALT '16 16 13  '$ ?ALKPHOS 70 64 60  ?BILITOT 0.8 0.8 0.5  ?PROT 7.2 6.3* 5.9*  ?ALBUMIN 4.4 3.2* 2.9*  ? ?  ?Antibiotics: ?Anti-infectives (From admission, onward)  ? ? Start     Dose/Rate Route Frequency Ordered Stop  ? 12/16/21 1200  cefTRIAXone (ROCEPHIN) 2 g in sodium chloride 0.9 % 100 mL IVPB       ? 2 g ?200 mL/hr over 30 Minutes Intravenous Every 24 hours 12/15/21 2239    ? 12/15/21 2300  metroNIDAZOLE (FLAGYL) IVPB 500 mg       ? 500 mg ?100 mL/hr over 60 Minutes Intravenous Every 12 hours 12/15/21 2239    ? 12/15/21 1115  cefTRIAXone (ROCEPHIN) 2 g in sodium chloride 0.9 % 100 mL IVPB       ? 2 g ?200 mL/hr over 30 Minutes Intravenous  Once 12/15/21 1100 12/15/21 1323  ? 12/15/21 1115  metroNIDAZOLE (FLAGYL) IVPB 500 mg       ? 500 mg ?100 mL/hr over 60 Minutes Intravenous  Once 12/15/21 1100 12/15/21 1244  ? ?  ? ? ? ?DVT prophylaxis: Heparin ? ?Code Status: Full code ? ?Family Communication: No family at bedside ? ? ?CONSULTS  ? ? ?Objective  ? ? ?Physical Examination: ? ? ?General-appears in no acute distress ?Heart-S1-S2, regular, no murmur auscultated ?Lungs-clear to auscultation bilaterally, no wheezing or crackles auscultated ?Abdomen-soft, nontender, no organomegaly ?Extremities-no edema in the lower extremities ?Neuro-alert, oriented x3, no focal deficit noted ? ?Status is: Inpatient:   ? ? ? ?  ? ? ?Traci Hudson ?  ?[facility] ?If 7PM-7AM, please contact night-coverage at [email or web address], ?Office  [phone] ? ? ?12/17/2021, 2:45 PM  LOS: 2 days  ? ? ? ? ? ? ? ? ? ? ?  ?

## 2021-12-17 NOTE — Progress Notes (Signed)
?  Transition of Care (TOC) Screening Note ? ? ?Patient Details  ?Name: Traci Hudson ?Date of Birth: 1955/10/13 ? ? ?Transition of Care (TOC) CM/SW Contact:    ?Rainey Kahrs, Marjie Skiff, RN ?Phone Number: ?12/17/2021, 1:05 PM ? ? ? ?Transition of Care Department [facility]) has reviewed patient and no TOC needs have been identified at this time. We will continue to monitor patient advancement through interdisciplinary progression rounds. If new patient transition needs arise, please place a TOC consult. ?  ?

## 2021-12-17 NOTE — Care Management Important Message (Signed)
Important Message ? ?Patient Details IM Letter given to the Patient. ?Name: Traci Hudson ?MRN: [id number] ?Date of Birth: [birth year] ? ? ?Medicare Important Message Given:  Yes ? ? ? ? ?[name] ?12/17/2021, 12:15 PM ?

## 2021-12-18 DIAGNOSIS — R1084 Generalized abdominal pain: Secondary | ICD-10-CM | POA: Diagnosis not present

## 2021-12-18 DIAGNOSIS — K5792 Diverticulitis of intestine, part unspecified, without perforation or abscess without bleeding: Secondary | ICD-10-CM | POA: Diagnosis not present

## 2021-12-18 DIAGNOSIS — R1031 Right lower quadrant pain: Secondary | ICD-10-CM

## 2021-12-18 DIAGNOSIS — K5732 Diverticulitis of large intestine without perforation or abscess without bleeding: Secondary | ICD-10-CM | POA: Diagnosis not present

## 2021-12-18 LAB — CBC
HCT: 33.8 % — ABNORMAL LOW (ref 36.0–46.0)
Hemoglobin: 11 g/dL — ABNORMAL LOW (ref 12.0–15.0)
MCH: 26.9 pg (ref 26.0–34.0)
MCHC: 32.5 g/dL (ref 30.0–36.0)
MCV: 82.6 fL (ref 80.0–100.0)
Platelets: 198 10*3/uL (ref 150–400)
RBC: 4.09 MIL/uL (ref 3.87–5.11)
RDW: 13.2 % (ref 11.5–15.5)
WBC: 8.4 10*3/uL (ref 4.0–10.5)
nRBC: 0 % (ref 0.0–0.2)

## 2021-12-18 LAB — BASIC METABOLIC PANEL
Anion gap: 7 (ref 5–15)
BUN: <5 mg/dL — ABNORMAL LOW (ref 8–23)
CO2: 25 mmol/L (ref 22–32)
Calcium: 8.7 mg/dL — ABNORMAL LOW (ref 8.9–10.3)
Chloride: 104 mmol/L (ref 98–111)
Creatinine, Ser: 0.57 mg/dL (ref 0.44–1.00)
GFR, Estimated: >60 mL/min (ref >60)
Glucose, Bld: 114 mg/dL — ABNORMAL HIGH (ref 70–99)
Potassium: 4.1 mmol/L (ref 3.5–5.1)
Sodium: 136 mmol/L (ref 135–145)

## 2021-12-18 LAB — GLUCOSE, CAPILLARY: Glucose-Capillary: 118 mg/dL — ABNORMAL HIGH (ref 70–99)

## 2021-12-18 MED ORDER — CIPROFLOXACIN HCL 500 MG PO TABS: 500.0000 mg | ORAL_TABLET | Freq: Two times a day (BID) | ORAL | 0 refills | Status: AC

## 2021-12-18 MED ORDER — METRONIDAZOLE 500 MG PO TABS: 500.0000 mg | ORAL_TABLET | Freq: Three times a day (TID) | ORAL | 0 refills | Status: AC

## 2021-12-18 NOTE — Discharge Summary (Addendum)
?Physician Discharge Summary ?  ?Patient: Traci Hudson MRN: [id number] DOB: 10-Jul-1956  ?Admit date:     12/15/2021  ?Discharge date: 12/18/21  ?Discharge Physician: Oswald Hillock  ? ?PCP: London Pepper, MD  ? ?Recommendations at discharge:  ? ?Follow-up Dr. Collene Mares as outpatient ? ?Discharge Diagnoses: ?Principal Problem: ?  Diverticulitis ? ?Resolved Problems: ?  * No resolved hospital problems. * ? ?Hospital Course: ? ?66 year old female with a history of recurrent sigmoid diverticulitis, had previously required drain placement came to ED with complaints of abdominal pain which acutely worsened over the past 24 hours.  In the ED CT abdomen/pelvis showed inflammation around sigmoid colon concerning for enteritis versus sigmoid diverticulitis.  There was also 1.7 cm fluid collection in the pelvic cavity which may be reactionary to the colitis.  Started on empiric antibiotics. ?  ?Assessment and Plan: ? ?Acute sigmoid diverticulitis ?-Clinically improved.  ?-WBC is down to 8000 ?-Patient was started on ceftriaxone, Flagyl ?-Tolerating soft diet ?-We will discharge on Cipro and Flagyl for 3 more days ?-Patient follows Dr. Collene Mares, GI ?-We will follow-up with Dr. Collene Mares as outpatient for further interventions including colonoscopy ? ?1.7 cm fluid collection in pelvic cavity ?-This may be secondary to reactionary to colitis ?-Hopefully should improve with antibiotics ?-Consider repeat imaging in 6 to 8 weeks as outpatient ?  ?Recent right hip periprosthetic fracture repair ?-Continue pain medications ?  ?Hypokalemia ?-Replete ?  ? ?  ? ? ?Consultants:  ?Procedures performed:   ?Disposition: Home ?Diet recommendation:  ?Discharge Diet Orders (From admission, onward)  ? ?  Start     Ordered  ? 12/18/21 0000  Diet - low sodium heart healthy       ? 12/18/21 1207  ? ?  ?  ? ?  ? ?Regular diet ?DISCHARGE MEDICATION: ?Allergies as of 12/18/2021   ? ?   Reactions  ? Codeine Itching  ? Sulfa Antibiotics   ? GI upset   ? Tape Itching,  Rash  ? ?  ? ?  ?Medication List  ?  ? ?TAKE these medications   ? ?albuterol 108 (90 Base) MCG/ACT inhaler ?Commonly known as: VENTOLIN HFA ?Inhale 2 puffs into the lungs every 6 (six) hours as needed for wheezing or shortness of breath. ?  ?atorvastatin 10 MG tablet ?Commonly known as: LIPITOR ?Take 10 mg by mouth daily in the afternoon. ?  ?celecoxib 200 MG capsule ?Commonly known as: CELEBREX ?Take 200 mg by mouth 2 (two) times daily. ?  ?ciprofloxacin 500 MG tablet ?Commonly known as: Cipro ?Take 1 tablet (500 mg total) by mouth 2 (two) times daily for 3 days. ?  ?cyclobenzaprine 5 MG tablet ?Commonly known as: FLEXERIL ?Take 5 mg by mouth 3 (three) times daily as needed for muscle spasms. ?  ?docusate sodium 100 MG capsule ?Commonly known as: COLACE ?Take 100 mg by mouth 2 (two) times daily as needed for mild constipation. ?  ?EPINEPHrine 0.3 mg/0.3 mL Soaj injection ?Commonly known as: EPI-PEN ?Inject 0.3 mg into the muscle as needed for anaphylaxis. ?  ?ferrous sulfate 325 (65 FE) MG tablet ?Commonly known as: FerrouSul ?Take 1 tablet (325 mg total) by mouth 3 (three) times daily with meals for 14 days. ?What changed: when to take this ?  ?fluticasone 50 MCG/ACT nasal spray ?Commonly known as: FLONASE ?Place 2 sprays into both nostrils 3 (three) times daily as needed for allergies. ?  ?HYDROcodone-acetaminophen 7.5-325 MG tablet ?Commonly known as: NORCO ?Take 1-2 tablets by  mouth every 6 (six) hours as needed for severe pain (pain score 7-10). ?  ?ipratropium 0.06 % nasal spray ?Commonly known as: ATROVENT ?Place 2 sprays into both nostrils 3 (three) times daily as needed (allergies). ?  ?ketotifen 0.025 % ophthalmic solution ?Commonly known as: ZADITOR ?Place 1 drop into both eyes daily as needed (allergies). ?  ?metroNIDAZOLE 500 MG tablet ?Commonly known as: Flagyl ?Take 1 tablet (500 mg total) by mouth 3 (three) times daily for 3 days. ?  ?multivitamin tablet ?Take 1 tablet by mouth daily. ?   ?omeprazole 20 MG capsule ?Commonly known as: PRILOSEC ?Take 20 mg by mouth daily as needed (acid reflux). ?  ?polyethylene glycol 17 g packet ?Commonly known as: MIRALAX / GLYCOLAX ?Take 1 packet by mouth daily as needed for moderate constipation. ?  ?sertraline 100 MG tablet ?Commonly known as: ZOLOFT ?Take 150 mg by mouth daily. ?  ?valACYclovir 1000 MG tablet ?Commonly known as: VALTREX ?Take 2,000 mg by mouth every 12 (twelve) hours as needed (cold sores). ?  ?zolpidem 5 MG tablet ?Commonly known as: AMBIEN ?Take 5 mg by mouth at bedtime. ?  ? ?  ? ? ?Discharge Exam: ?Filed Weights  ? 12/15/21 1100  ?Weight: 68.9 kg  ? ?General-appears in no acute distress ?Heart-S1-S2, regular, no murmur auscultated ?Lungs-clear to auscultation bilaterally, no wheezing or crackles auscultated ?Abdomen-soft, nontender, no organomegaly ?Extremities-no edema in the lower extremities ?Neuro-alert, oriented x3, no focal deficit noted ? ?Condition at discharge: good ? ?The results of significant diagnostics from this hospitalization (including imaging, microbiology, ancillary and laboratory) are listed below for reference.  ? ?Imaging Studies: ?CT ABDOMEN PELVIS W CONTRAST ? ?Result Date: 12/15/2021 ?CLINICAL DATA:  Abdominal pain, nausea, vomiting EXAM: CT ABDOMEN AND PELVIS WITH CONTRAST TECHNIQUE: Multidetector CT imaging of the abdomen and pelvis was performed using the standard protocol following bolus administration of intravenous contrast. RADIATION DOSE REDUCTION: This exam was performed according to the departmental dose-optimization program which includes automated exposure control, adjustment of the mA and/or kV according to patient size and/or use of iterative reconstruction technique. CONTRAST:  10m OMNIPAQUE IOHEXOL 300 MG/ML  SOLN COMPARISON:  08/12/2020 FINDINGS: Lower chest: Unremarkable. Hepatobiliary: No focal abnormality is seen in the liver. Surgical clips are seen in gallbladder fossa. Prominence of  extrahepatic bile ducts may be related to previous cholecystectomy. Pancreas: There is slight prominence of pancreatic duct. No focal abnormality is seen. Spleen: Spleen measures 12.9 cm in maximum diameter. Adrenals/Urinary Tract: Adrenals are unremarkable. There is no hydronephrosis. There are no renal or ureteral stones. Urinary bladder is not distended. Severe beam hardening artifacts limit evaluation of the bladder. Stomach/Bowel: Small hiatal hernia is seen. Stomach is not distended. There is mild dilation of distal small bowel loops. There is fluid in the lumen of distal small bowel loops. There is mild wall thickening in the distal small bowel loops. Appendix is not seen. There is fluid in the lumen of right colon. Multiple diverticula are seen in colon. There is wall thickening in the sigmoid colon. There is pericolic stranding adjacent to the sigmoid colon. In image 68 of series 2, there is 1.7 cm fluid collection in the pelvis adjacent to sigmoid colon and distal small bowel loops. Vascular/Lymphatic: Unremarkable. Reproductive: Uterus is not seen. Other: There is no pneumoperitoneum. Small umbilical hernia containing fat is seen. Musculoskeletal: Degenerative changes are noted in the lumbar spine, particularly prominent in the facet joints at L4-L5 and L5-S1 levels. 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  IMPRESSION: Diverticulosis of colon. There is wall thickening in the distal sigmoid colon in the pelvic cavity along with pericolic stranding. There is mild dilation and wall thickening in the distal small bowel loops in the pelvic cavity. Findings suggest possible acute sigmoid diverticulitis or acute enteritis involving the distal small bowel loops. Beam hardening artifacts caused by surgical hardware in both hips limit evaluation of pelvic cavity. 1.7 cm fluid collection seen in the pelvic cavity may be related to acute inflammatory process in the sigmoid colon or distal small bowel loops in  the pelvic cavity. There is no pneumoperitoneum. There is no evidence of intestinal obstruction. There is no hydronephrosis. Electronically Signed   By: Elmer Picker M.D.   On: 12/15/2021 12:16   ? ?Microbiology:

## 2021-12-18 NOTE — Plan of Care (Signed)

## 2021-12-20 LAB — CULTURE, BLOOD (ROUTINE X 2)
Culture: NO GROWTH
Culture: NO GROWTH
Special Requests: ADEQUATE
Special Requests: ADEQUATE

## 2021-12-28 DIAGNOSIS — Z1211 Encounter for screening for malignant neoplasm of colon: Secondary | ICD-10-CM | POA: Diagnosis not present

## 2021-12-28 DIAGNOSIS — K449 Diaphragmatic hernia without obstruction or gangrene: Secondary | ICD-10-CM | POA: Diagnosis not present

## 2021-12-28 DIAGNOSIS — K429 Umbilical hernia without obstruction or gangrene: Secondary | ICD-10-CM | POA: Diagnosis not present

## 2021-12-28 DIAGNOSIS — K219 Gastro-esophageal reflux disease without esophagitis: Secondary | ICD-10-CM | POA: Diagnosis not present

## 2021-12-28 DIAGNOSIS — K5730 Diverticulosis of large intestine without perforation or abscess without bleeding: Secondary | ICD-10-CM | POA: Diagnosis not present

## 2022-02-02 DIAGNOSIS — H53143 Visual discomfort, bilateral: Secondary | ICD-10-CM | POA: Diagnosis not present

## 2022-02-28 DIAGNOSIS — K648 Other hemorrhoids: Secondary | ICD-10-CM | POA: Diagnosis not present

## 2022-02-28 DIAGNOSIS — K5730 Diverticulosis of large intestine without perforation or abscess without bleeding: Secondary | ICD-10-CM | POA: Diagnosis not present

## 2022-02-28 DIAGNOSIS — R1032 Left lower quadrant pain: Secondary | ICD-10-CM | POA: Diagnosis not present

## 2022-02-28 DIAGNOSIS — R933 Abnormal findings on diagnostic imaging of other parts of digestive tract: Secondary | ICD-10-CM | POA: Diagnosis not present

## 2022-03-04 DIAGNOSIS — E1169 Type 2 diabetes mellitus with other specified complication: Secondary | ICD-10-CM | POA: Diagnosis not present

## 2022-03-04 DIAGNOSIS — E611 Iron deficiency: Secondary | ICD-10-CM | POA: Diagnosis not present

## 2022-03-04 DIAGNOSIS — M8580 Other specified disorders of bone density and structure, unspecified site: Secondary | ICD-10-CM | POA: Diagnosis not present

## 2022-03-04 DIAGNOSIS — G2581 Restless legs syndrome: Secondary | ICD-10-CM | POA: Diagnosis not present

## 2022-03-04 DIAGNOSIS — G4700 Insomnia, unspecified: Secondary | ICD-10-CM | POA: Diagnosis not present

## 2022-03-04 DIAGNOSIS — Z0000 Encounter for general adult medical examination without abnormal findings: Secondary | ICD-10-CM | POA: Diagnosis not present

## 2022-03-04 DIAGNOSIS — E7800 Pure hypercholesterolemia, unspecified: Secondary | ICD-10-CM | POA: Diagnosis not present

## 2022-04-28 DIAGNOSIS — Z03818 Encounter for observation for suspected exposure to other biological agents ruled out: Secondary | ICD-10-CM | POA: Diagnosis not present

## 2022-04-28 DIAGNOSIS — J029 Acute pharyngitis, unspecified: Secondary | ICD-10-CM | POA: Diagnosis not present

## 2022-04-28 DIAGNOSIS — R051 Acute cough: Secondary | ICD-10-CM | POA: Diagnosis not present

## 2022-04-28 DIAGNOSIS — J0191 Acute recurrent sinusitis, unspecified: Secondary | ICD-10-CM | POA: Diagnosis not present

## 2022-05-07 DIAGNOSIS — J208 Acute bronchitis due to other specified organisms: Secondary | ICD-10-CM | POA: Diagnosis not present

## 2022-05-15 ENCOUNTER — Emergency Department (HOSPITAL_BASED_OUTPATIENT_CLINIC_OR_DEPARTMENT_OTHER)
Admission: EM | Admit: 2022-05-15 | Discharge: 2022-05-15 | Disposition: A | Discharge status: Home / Self Care | Payer: Medicare Other | Attending: Emergency Medicine | Admitting: Emergency Medicine

## 2022-05-15 ENCOUNTER — Encounter (HOSPITAL_BASED_OUTPATIENT_CLINIC_OR_DEPARTMENT_OTHER): Payer: Self-pay | Admitting: Emergency Medicine

## 2022-05-15 ENCOUNTER — Other Ambulatory Visit: Payer: Self-pay

## 2022-05-15 DIAGNOSIS — Z7951 Long term (current) use of inhaled steroids: Secondary | ICD-10-CM | POA: Diagnosis not present

## 2022-05-15 DIAGNOSIS — J45909 Unspecified asthma, uncomplicated: Secondary | ICD-10-CM | POA: Insufficient documentation

## 2022-05-15 DIAGNOSIS — M62838 Other muscle spasm: Secondary | ICD-10-CM | POA: Diagnosis not present

## 2022-05-15 DIAGNOSIS — I10 Essential (primary) hypertension: Secondary | ICD-10-CM | POA: Insufficient documentation

## 2022-05-15 DIAGNOSIS — Z96643 Presence of artificial hip joint, bilateral: Secondary | ICD-10-CM | POA: Diagnosis not present

## 2022-05-15 DIAGNOSIS — J209 Acute bronchitis, unspecified: Secondary | ICD-10-CM | POA: Diagnosis not present

## 2022-05-15 DIAGNOSIS — M545 Low back pain, unspecified: Secondary | ICD-10-CM | POA: Diagnosis present

## 2022-05-15 DIAGNOSIS — Z79899 Other long term (current) drug therapy: Secondary | ICD-10-CM | POA: Insufficient documentation

## 2022-05-15 DIAGNOSIS — M62830 Muscle spasm of back: Secondary | ICD-10-CM | POA: Diagnosis not present

## 2022-05-15 MED ORDER — CYCLOBENZAPRINE HCL 10 MG PO TABS
5.0000 mg | ORAL_TABLET | Freq: Three times a day (TID) | ORAL | 0 refills | Status: AC | PRN
Start: 2022-05-15 — End: ?

## 2022-05-15 NOTE — ED Notes (Signed)
Pt verbalizes understanding of discharge instructions. Opportunity for questioning and answers were provided. Pt discharged from ED to home.   ? ?

## 2022-05-15 NOTE — ED Triage Notes (Signed)
Pt presents to [facility]. Pt c/o bilateral lower back pain. Pt reports that she had had bronchitis and was told that her bak pain was from coughing. Pt reports that cough is improving but back pain is worsening.

## 2022-05-15 NOTE — ED Provider Notes (Signed)
[facility] Provider Note: Traci Spurling, MD, FACEP  CSN: [id number] MRN: [id number] ARRIVAL: 05/15/22 at [place]: [id number]   CHIEF COMPLAINT  Back Pain   HISTORY OF PRESENT ILLNESS  05/15/22 5:26 AM Traci Hudson is a 66 y.o. female who has recently had bronchitis and has been coughing frequently.  She is already seen her physician and has been given an inhaler, prednisone and an antibiotic.  She has tested negative for COVID, influenza and RSV.  She believes the cough has caused her to developed bilateral flank pain.  The pain is located bilaterally in the soft tissue of the abdominal wall just below the rib cages.  It is severe at times and is worse with certain movements.  She rates it as a 9 out of 10 at its worst.  She describes it as feeling like muscle spasms  Past Medical History:  Diagnosis Date   Arthritis    Asthma    seasonal   Depression    Diverticulitis    Dyspnea    With asthma flares   GERD (gastroesophageal reflux disease)    Hypercholesteremia    Hypertension    Insomnia    Pre-diabetes    Skin cancer     Past Surgical History:  Procedure Laterality Date   ABDOMINAL HYSTERECTOMY  1985   APPENDECTOMY  08/08/1996   BRAIN SURGERY  08/09/1967   BREAST REDUCTION SURGERY  08/08/1998   CHOLECYSTECTOMY  1998   FEMORAL REVISION Right 09/13/2021   Procedure: Revision Right femoral component posterior approach;  Surgeon: Paralee Cancel, MD;  Location: [facility];  Service: Orthopedics;  Laterality: Right;  90 mins Dr. Alvan Dame requests to start at 2pm!!   TOTAL HIP ARTHROPLASTY Left 10/08/2019   Procedure: TOTAL HIP ARTHROPLASTY ANTERIOR APPROACH;  Surgeon: Paralee Cancel, MD;  Location: [facility];  Service: Orthopedics;  Laterality: Left;  70 mins   TOTAL HIP ARTHROPLASTY Right 08/03/2021   Procedure: TOTAL HIP ARTHROPLASTY ANTERIOR APPROACH;  Surgeon: Paralee Cancel, MD;  Location: [facility];  Service: Orthopedics;  Laterality: Right;   VESICOVAGINAL FISTULA  CLOSURE W/ TAH  2017    Family History  Problem Relation Age of Onset   Emphysema Mother        smoked   Lung cancer Mother        smoked    Social History   Tobacco Use   Smoking status: Never   Smokeless tobacco: Never  Vaping Use   Vaping Use: Never used  Substance Use Topics   Alcohol use: Yes    Alcohol/week: 2.0 standard drinks of alcohol    Types: 2 Glasses of wine per week    Comment: occasionally   Drug use: No    Prior to Admission medications   Medication Sig Start Date End Date Taking? Authorizing Provider  albuterol (PROVENTIL HFA;VENTOLIN HFA) 108 (90 Base) MCG/ACT inhaler Inhale 2 puffs into the lungs every 6 (six) hours as needed for wheezing or shortness of breath.    [provider]  atorvastatin (LIPITOR) 10 MG tablet Take 10 mg by mouth daily in the afternoon.    [provider]  celecoxib (CELEBREX) 200 MG capsule Take 200 mg by mouth 2 (two) times daily. 09/12/21   [provider]  cyclobenzaprine (FLEXERIL) 10 MG tablet Take 0.5 tablets (5 mg total) by mouth 3 (three) times daily as needed for muscle spasms. 05/15/22   Nikoleta Dady, MD  docusate sodium (COLACE) 100 MG capsule Take 100 mg by  mouth 2 (two) times daily as needed for mild constipation. 07/29/21   [provider]  EPINEPHrine 0.3 mg/0.3 mL IJ SOAJ injection Inject 0.3 mg into the muscle as needed for anaphylaxis.    [provider]  ferrous sulfate (FERROUSUL) 325 (65 FE) MG tablet Take 1 tablet (325 mg total) by mouth 3 (three) times daily with meals for 14 days. Patient taking differently: Take 325 mg by mouth daily. 10/08/19 09/13/21  Danae Orleans, PA-C  fluticasone (FLONASE) 50 MCG/ACT nasal spray Place 2 sprays into both nostrils 3 (three) times daily as needed for allergies. 10/22/15   [provider]  HYDROcodone-acetaminophen (NORCO) 7.5-325 MG tablet Take 1-2 tablets by mouth every 6 (six) hours as needed for severe pain (pain score  7-10). 09/16/21   Irving Copas, PA-C  ipratropium (ATROVENT) 0.06 % nasal spray Place 2 sprays into both nostrils 3 (three) times daily as needed (allergies).    [provider]  ketotifen (ZADITOR) 0.025 % ophthalmic solution Place 1 drop into both eyes daily as needed (allergies).    [provider]  Multiple Vitamin (MULTIVITAMIN) tablet Take 1 tablet by mouth daily.    [provider]  omeprazole (PRILOSEC) 20 MG capsule Take 20 mg by mouth daily as needed (acid reflux).    [provider]  polyethylene glycol (MIRALAX / GLYCOLAX) 17 g packet Take 1 packet by mouth daily as needed for moderate constipation. 07/29/21   [provider]  sertraline (ZOLOFT) 100 MG tablet Take 150 mg by mouth daily.  11/09/15   [provider]  valACYclovir (VALTREX) 1000 MG tablet Take 2,000 mg by mouth every 12 (twelve) hours as needed (cold sores).    [provider]  zolpidem (AMBIEN) 5 MG tablet Take 5 mg by mouth at bedtime.    [provider]    Allergies Codeine, Sulfa antibiotics, and Tape   REVIEW OF SYSTEMS  Negative except as noted here or in the History of Present Illness.   PHYSICAL EXAMINATION  Initial Vital Signs Blood pressure (!) 151/104, pulse 81, resp. rate 18, SpO2 100 %.  Examination General: Well-developed, well-nourished female in no acute distress; appearance consistent with age of record HENT: normocephalic; atraumatic Eyes: Normal appearance Neck: supple Heart: regular rate and rhythm Lungs: Frequent cough Abdomen: soft; nondistended; left and right lateral abdominal wall and CVA tenderness; bowel sounds present GU: Urine clear yellow with no cloudiness or sediment Extremities: No deformity; full range of motion Neurologic: Awake, alert and oriented; motor function intact in all extremities and symmetric; no facial droop Skin: Warm and dry Psychiatric: Normal mood and affect   RESULTS  Summary of  this visit's results, reviewed and interpreted by myself:   EKG Interpretation  Date/Time:    Ventricular Rate:    PR Interval:    QRS Duration:   QT Interval:    QTC Calculation:   R Axis:     Text Interpretation:         Laboratory Studies: No results found for this or any previous visit (from the past 24 hour(s)). Imaging Studies: No results found.  ED COURSE and MDM  Nursing notes, initial and subsequent vitals signs, including pulse oximetry, reviewed and interpreted by myself.  Vitals:   05/15/22 0258 05/15/22 0535  BP: (!) 151/104 (!) 145/90  Pulse: 81 70  Resp: 18   SpO2: 100% 97%   Medications - No data to display  The patient is having no urinary symptoms, fever or  chills.  The tenderness does include the kidney regions but also extends more anteriorly or bilateral and seems to be more superficial.  I suspect spasms of the abdominal wall muscles as a result of persistent coughing the last week.  She would like a refill of her Flexeril.  PROCEDURES  Procedures   ED DIAGNOSES     ICD-10-CM   1. Acute bronchitis with bronchospasm  J20.9     2. Spasm of abdominal muscles of left side  M62.838     3. Spasm of abdominal muscles of right side  M62.838          Fontaine Hehl, Jenny Reichmann, MD 05/15/22 [phone]

## 2022-05-23 ENCOUNTER — Telehealth: Payer: Self-pay

## 2022-05-23 NOTE — Telephone Encounter (Signed)
        Patient  visited [facility] on 10/8   Telephone encounter attempt :  1st  A HIPAA compliant voice message was left requesting a return call.  Instructed patient to call back    [place], [facility] [employer]  [phone] [address], [place], [place] [address] Phone: [phone] Email: [email or web address]

## 2022-05-24 ENCOUNTER — Telehealth: Payer: Self-pay

## 2022-05-24 NOTE — Telephone Encounter (Signed)
        Patient  visited [facility] on 10/8    Telephone encounter attempt :  2nd  A HIPAA compliant voice message was left requesting a return call.  Instructed patient to call back    [place], [place] [employer]  [phone] [address], [place], [place] [address] Phone: [phone] Email: [email or web address]

## 2022-06-03 IMAGING — DX DG PORTABLE PELVIS
2 series · 2 of 2 positions shown · non-contrast
Comparison: 08/03/2021

CLINICAL DATA: Right hip prosthesis revision

EXAM:
PORTABLE PELVIS 1-2 VIEWS

[pelvis ap (1 of 2)]
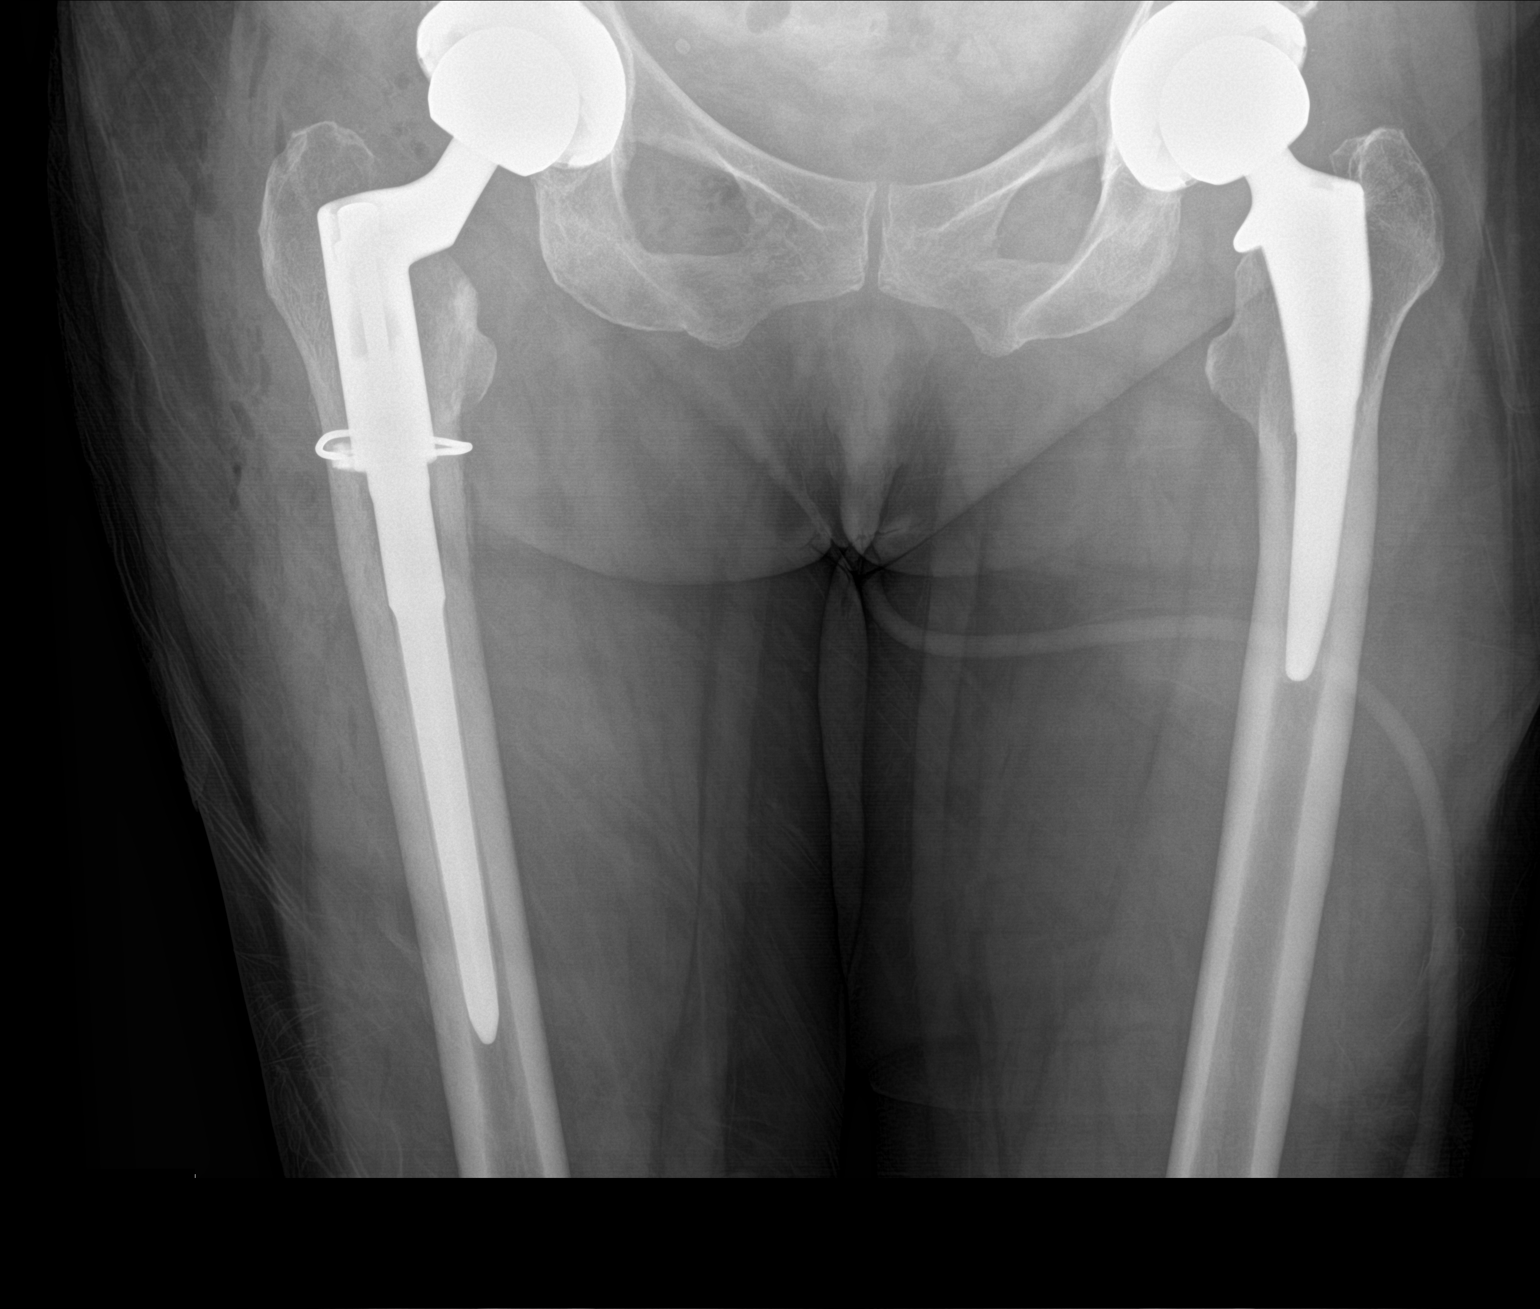

[pelvis ap (2 of 2)]
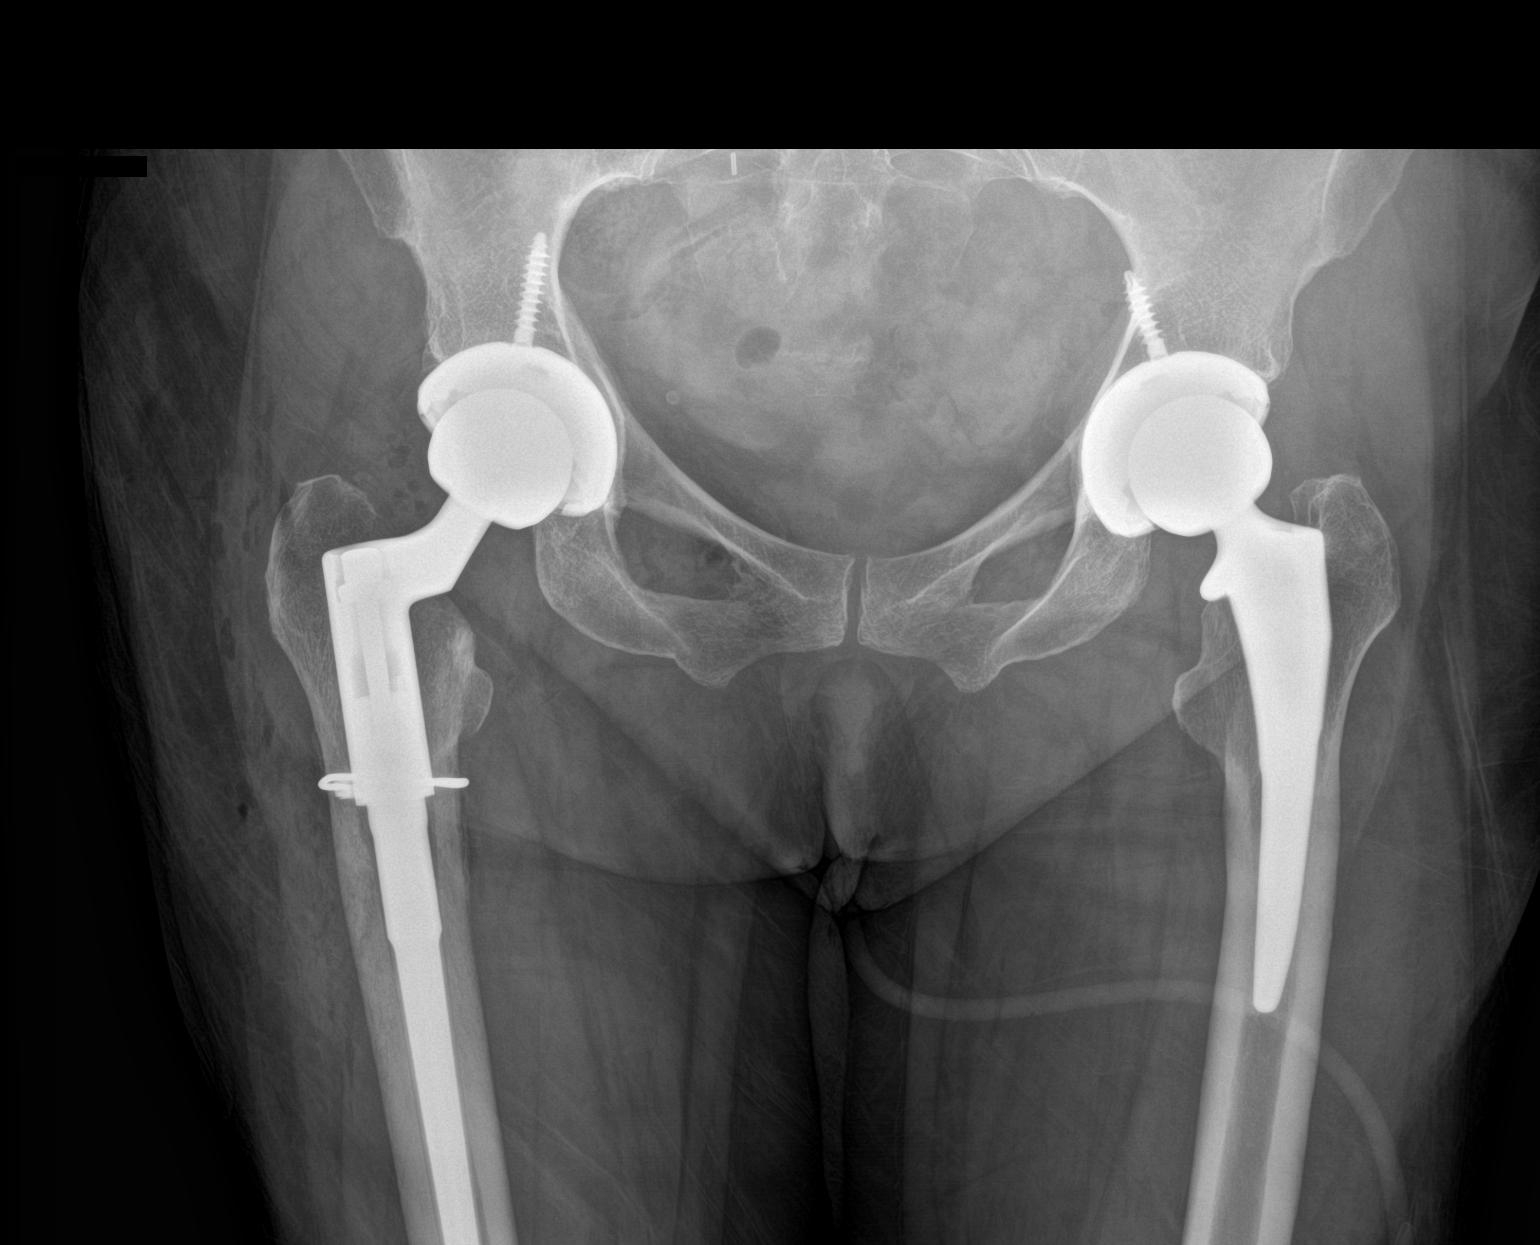

[2 of 2 positions shown; findings below may reference images not displayed]

FINDINGS: Two frontal views of the pelvis are obtained. Left hip arthroplasty
is unchanged. Interval revision of the femoral component of the
right hip arthroplasty, with long stem component and proximal
cerclage wire surrounding the proximal femoral diaphysis. Alignment
is anatomic. Postsurgical changes are seen in the overlying soft
tissues.
IMPRESSION: 1. Interval revision of right hip arthroplasty as above.

## 2022-06-08 ENCOUNTER — Other Ambulatory Visit: Payer: Self-pay | Admitting: Family Medicine

## 2022-06-08 ENCOUNTER — Ambulatory Visit
Admission: RE | Admit: 2022-06-08 | Discharge: 2022-06-08 | Disposition: A | Discharge status: Home / Self Care | Payer: Medicare Other | Source: Ambulatory Visit | Attending: Family Medicine | Admitting: Family Medicine

## 2022-06-08 DIAGNOSIS — R059 Cough, unspecified: Secondary | ICD-10-CM | POA: Diagnosis not present

## 2022-06-08 DIAGNOSIS — R052 Subacute cough: Secondary | ICD-10-CM

## 2022-06-15 DIAGNOSIS — M79602 Pain in left arm: Secondary | ICD-10-CM | POA: Diagnosis not present

## 2022-06-17 ENCOUNTER — Telehealth: Payer: Self-pay | Admitting: *Deleted

## 2022-06-17 NOTE — Patient Outreach (Signed)
  Care Coordination   06/17/2022 Name: Traci Hudson MRN: [id number] DOB: September 07, 1955   Care Coordination Outreach Attempts:  An unsuccessful telephone outreach was attempted today to offer the patient information about available care coordination services as a benefit of their health plan.   Follow Up Plan:  Additional outreach attempts will be made to offer the patient care coordination information and services.   Encounter Outcome:  No Answer  Care Coordination Interventions Activated:  No   Care Coordination Interventions:  No, not indicated    Raina Mina, RN Care Management Coordinator [place] Office [phone]

## 2022-06-29 DIAGNOSIS — M8589 Other specified disorders of bone density and structure, multiple sites: Secondary | ICD-10-CM | POA: Diagnosis not present

## 2022-06-29 DIAGNOSIS — Z780 Asymptomatic menopausal state: Secondary | ICD-10-CM | POA: Diagnosis not present

## 2022-06-29 DIAGNOSIS — Z1231 Encounter for screening mammogram for malignant neoplasm of breast: Secondary | ICD-10-CM | POA: Diagnosis not present

## 2022-07-27 ENCOUNTER — Telehealth: Payer: Self-pay | Admitting: *Deleted

## 2022-07-27 NOTE — Patient Outreach (Signed)
  Care Coordination   07/27/2022 Name: Traci Hudson MRN: [id number] DOB: Dec 22, 1955   Care Coordination Outreach Attempts:  A second unsuccessful outreach was attempted today to offer the patient with information about available care coordination services as a benefit of their health plan.     Follow Up Plan:  Additional outreach attempts will be made to offer the patient care coordination information and services.   Encounter Outcome:  No Answer   Care Coordination Interventions:  No, not indicated    Raina Mina, RN Care Management Coordinator [place] Office [phone]

## 2022-08-12 DIAGNOSIS — R052 Subacute cough: Secondary | ICD-10-CM | POA: Diagnosis not present

## 2022-09-04 IMAGING — CT CT ABD-PELV W/ CM
2 of 5 series · 15 of 46 positions shown, 17 images · IV contrast (APPLIED)
Comparison: 08/12/2020

CLINICAL DATA: Abdominal pain, nausea, vomiting

EXAM:
CT ABDOMEN AND PELVIS WITH CONTRAST
TECHNIQUE: Multidetector CT imaging of the abdomen and pelvis was performed
using the standard protocol following bolus administration of
intravenous contrast.

[Series 2: abd (person_name) · axial · 0.66mm/px · z∈[+922,+1338]mm · 12 of 93 slices shown, 14 images]
[im 5/93  soft-tissue]
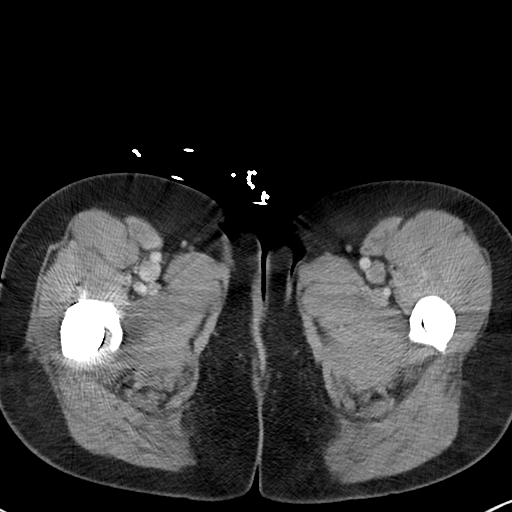
[im 5/93  bone]
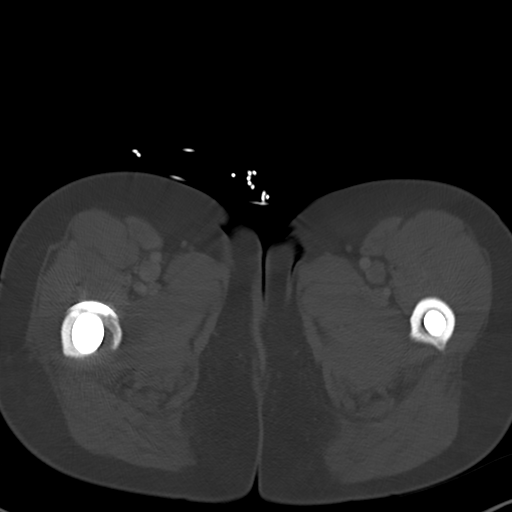
[im 15/93  soft-tissue]
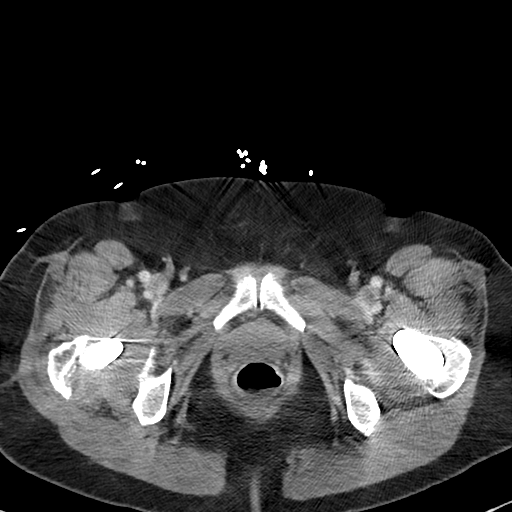
[im 20/93  soft-tissue]
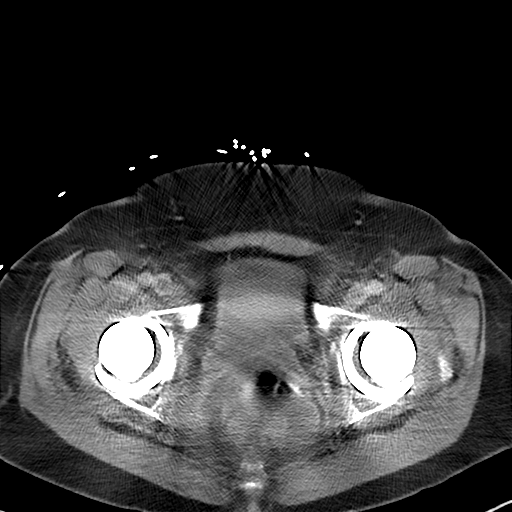
[im 30/93  soft-tissue]
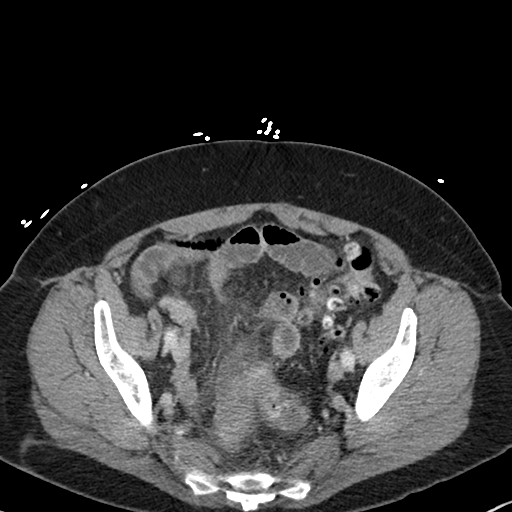
[im 34/93  soft-tissue]
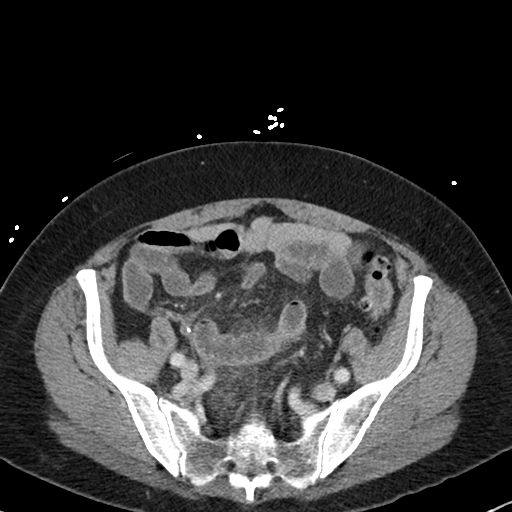
[im 44/93  soft-tissue]
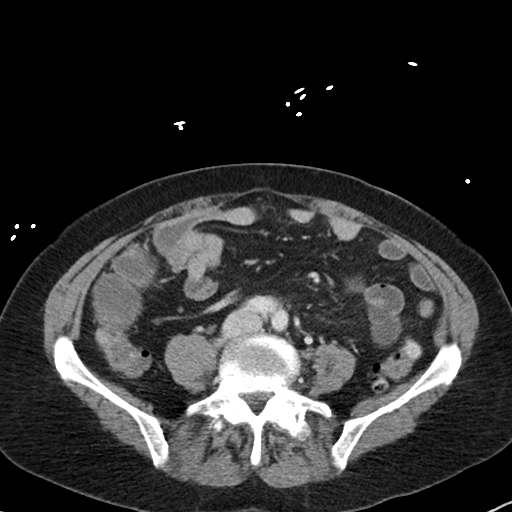
[im 49/93  soft-tissue]
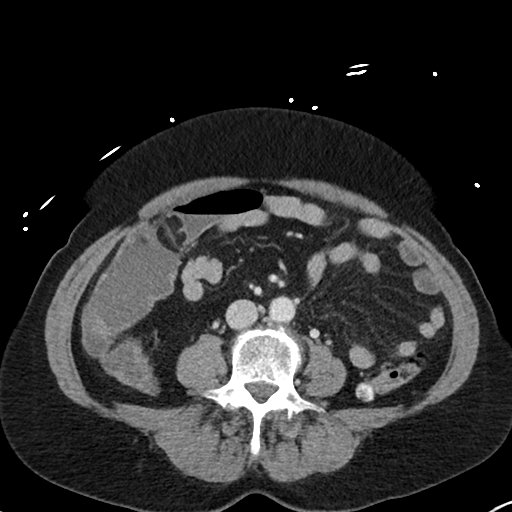
[im 59/93  soft-tissue]
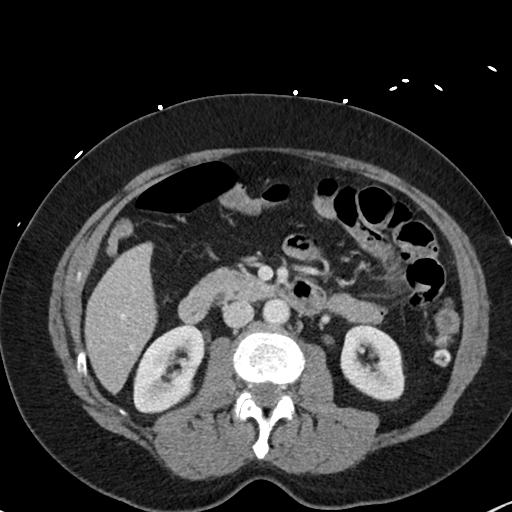
[im 63/93  soft-tissue]
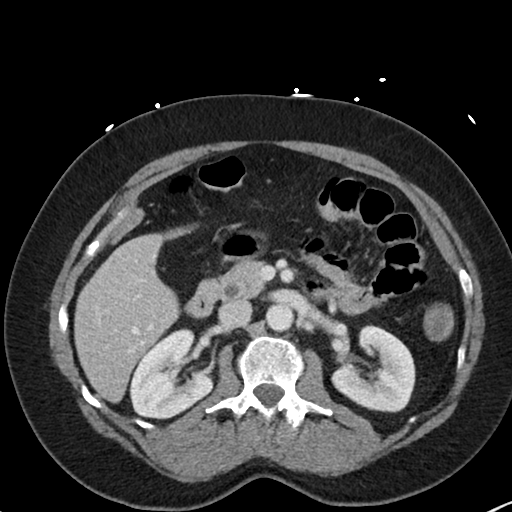
[im 63/93  bone]
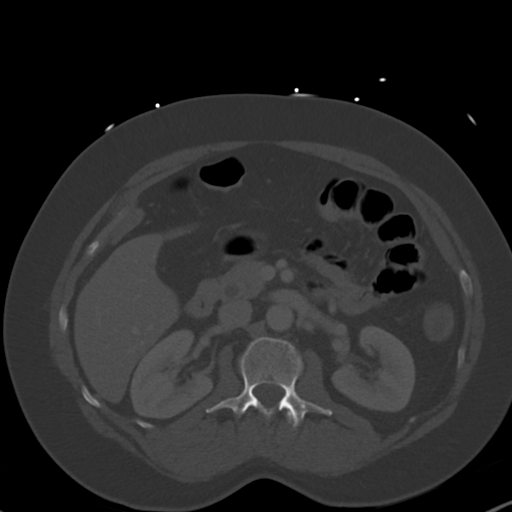
[im 73/93  soft-tissue]
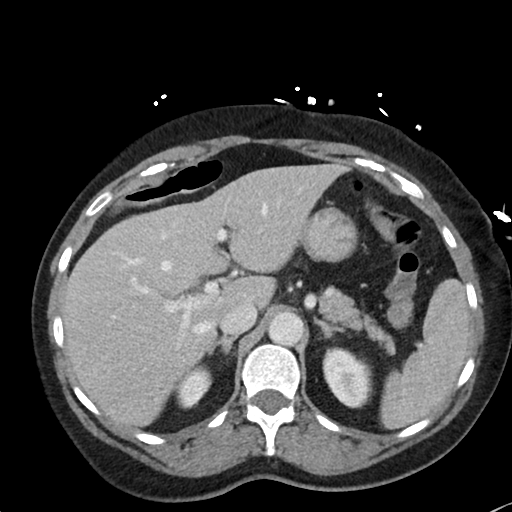
[im 78/93  soft-tissue]
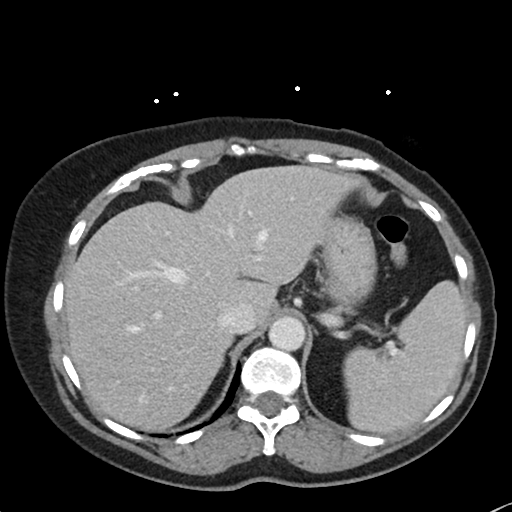
[im 88/93  soft-tissue]
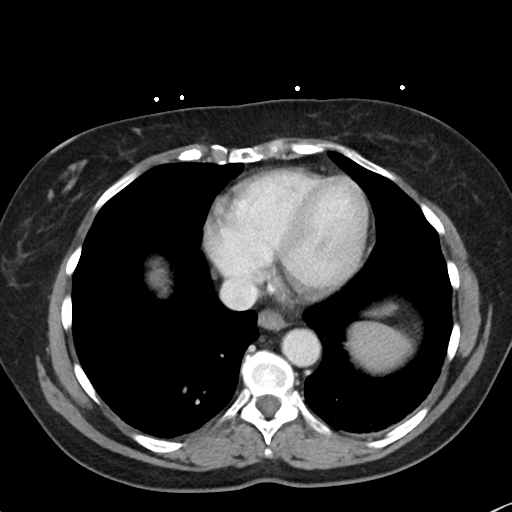

[Series 5: coronal (person_name) · coronal · 0.76mm/px · 3 of 98 slices shown]
[im 33/98  soft-tissue]
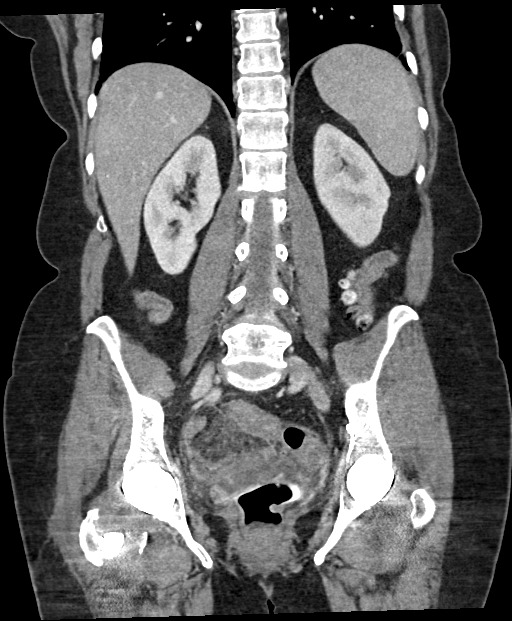
[im 44/98  soft-tissue]
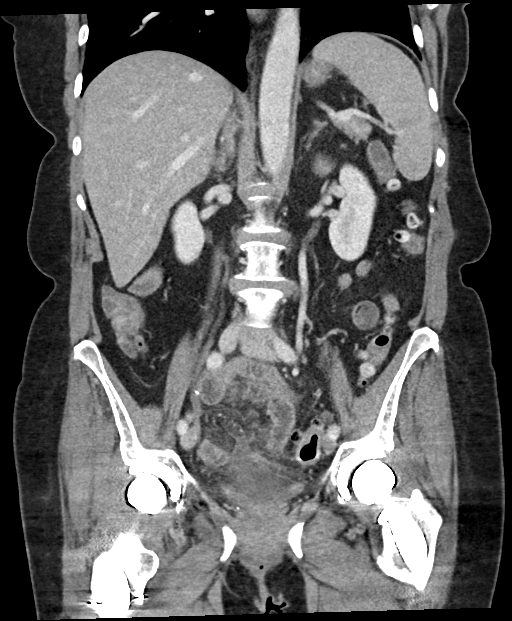
[im 54/98  soft-tissue]
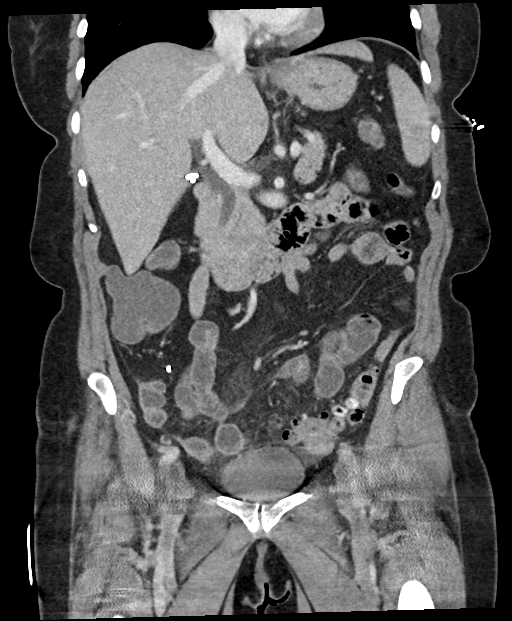

[15 of 46 positions shown; findings below may reference images not displayed]

RADIATION DOSE REDUCTION: This exam was performed according to the
departmental dose-optimization program which includes automated
exposure control, adjustment of the mA and/or kV according to
patient size and/or use of iterative reconstruction technique.

CONTRAST:  80mL OMNIPAQUE IOHEXOL 300 MG/ML  SOLN
FINDINGS: Lower chest: Unremarkable.

Hepatobiliary: No focal abnormality is seen in the liver. Surgical
clips are seen in gallbladder fossa. Prominence of extrahepatic bile
ducts may be related to previous cholecystectomy.

Pancreas: There is slight prominence of pancreatic duct. No focal
abnormality is seen.

Spleen: Spleen measures 12.9 cm in maximum diameter.

Adrenals/Urinary Tract: Adrenals are unremarkable. There is no
hydronephrosis. There are no renal or ureteral stones. Urinary
bladder is not distended. Severe beam hardening artifacts limit
evaluation of the bladder.

Stomach/Bowel: Small hiatal hernia is seen. Stomach is not
distended. There is mild dilation of distal small bowel loops. There
is fluid in the lumen of distal small bowel loops. There is mild
wall thickening in the distal small bowel loops. Appendix is not
seen. There is fluid in the lumen of right colon. Multiple
diverticula are seen in colon. There is wall thickening in the
sigmoid colon. There is pericolic stranding adjacent to the sigmoid
colon. In image 68 of series 2, there is 1.7 cm fluid collection in
the pelvis adjacent to sigmoid colon and distal small bowel loops.

Vascular/Lymphatic: Unremarkable.

Reproductive: Uterus is not seen.

Other: There is no pneumoperitoneum. Small umbilical hernia
containing fat is seen.

Musculoskeletal: Degenerative changes are noted in the lumbar spine,
particularly prominent in the facet joints at L4-L5 and L5-S1
levels. There is evidence of bilateral hip arthroplasty.
IMPRESSION: Diverticulosis of colon. There is wall thickening in the distal
sigmoid colon in the pelvic cavity along with pericolic stranding.
There is mild dilation and wall thickening in the distal small bowel
loops in the pelvic cavity. Findings suggest possible acute sigmoid
diverticulitis or acute enteritis involving the distal small bowel
loops. Beam hardening artifacts caused by surgical hardware in both
hips limit evaluation of pelvic cavity.

1.7 cm fluid collection seen in the pelvic cavity may be related to
acute inflammatory process in the sigmoid colon or distal small
bowel loops in the pelvic cavity.

There is no pneumoperitoneum. There is no evidence of intestinal
obstruction. There is no hydronephrosis.

## 2022-10-24 DIAGNOSIS — J3089 Other allergic rhinitis: Secondary | ICD-10-CM | POA: Diagnosis not present

## 2022-10-24 DIAGNOSIS — H1045 Other chronic allergic conjunctivitis: Secondary | ICD-10-CM | POA: Diagnosis not present

## 2022-10-24 DIAGNOSIS — J301 Allergic rhinitis due to pollen: Secondary | ICD-10-CM | POA: Diagnosis not present

## 2022-10-24 DIAGNOSIS — J4530 Mild persistent asthma, uncomplicated: Secondary | ICD-10-CM | POA: Diagnosis not present

## 2023-02-17 DIAGNOSIS — Z96641 Presence of right artificial hip joint: Secondary | ICD-10-CM | POA: Diagnosis not present

## 2023-02-17 DIAGNOSIS — Z96642 Presence of left artificial hip joint: Secondary | ICD-10-CM | POA: Diagnosis not present

## 2023-02-24 DIAGNOSIS — J029 Acute pharyngitis, unspecified: Secondary | ICD-10-CM | POA: Diagnosis not present

## 2023-03-09 DIAGNOSIS — G4700 Insomnia, unspecified: Secondary | ICD-10-CM | POA: Diagnosis not present

## 2023-03-09 DIAGNOSIS — Z0000 Encounter for general adult medical examination without abnormal findings: Secondary | ICD-10-CM | POA: Diagnosis not present

## 2023-03-09 DIAGNOSIS — J988 Other specified respiratory disorders: Secondary | ICD-10-CM | POA: Diagnosis not present

## 2023-03-09 DIAGNOSIS — M8580 Other specified disorders of bone density and structure, unspecified site: Secondary | ICD-10-CM | POA: Diagnosis not present

## 2023-03-09 DIAGNOSIS — E611 Iron deficiency: Secondary | ICD-10-CM | POA: Diagnosis not present

## 2023-03-09 DIAGNOSIS — Z9181 History of falling: Secondary | ICD-10-CM | POA: Diagnosis not present

## 2023-03-09 DIAGNOSIS — E559 Vitamin D deficiency, unspecified: Secondary | ICD-10-CM | POA: Diagnosis not present

## 2023-03-09 DIAGNOSIS — E1169 Type 2 diabetes mellitus with other specified complication: Secondary | ICD-10-CM | POA: Diagnosis not present

## 2023-03-09 DIAGNOSIS — E7800 Pure hypercholesterolemia, unspecified: Secondary | ICD-10-CM | POA: Diagnosis not present

## 2023-03-09 DIAGNOSIS — E119 Type 2 diabetes mellitus without complications: Secondary | ICD-10-CM | POA: Diagnosis not present

## 2023-06-07 DIAGNOSIS — Z85820 Personal history of malignant melanoma of skin: Secondary | ICD-10-CM | POA: Diagnosis not present

## 2023-06-07 DIAGNOSIS — L814 Other melanin hyperpigmentation: Secondary | ICD-10-CM | POA: Diagnosis not present

## 2023-06-07 DIAGNOSIS — L304 Erythema intertrigo: Secondary | ICD-10-CM | POA: Diagnosis not present

## 2023-06-07 DIAGNOSIS — L570 Actinic keratosis: Secondary | ICD-10-CM | POA: Diagnosis not present

## 2023-06-07 DIAGNOSIS — L821 Other seborrheic keratosis: Secondary | ICD-10-CM | POA: Diagnosis not present

## 2023-06-07 DIAGNOSIS — Z85828 Personal history of other malignant neoplasm of skin: Secondary | ICD-10-CM | POA: Diagnosis not present

## 2023-06-07 DIAGNOSIS — L578 Other skin changes due to chronic exposure to nonionizing radiation: Secondary | ICD-10-CM | POA: Diagnosis not present

## 2023-07-12 DIAGNOSIS — H53143 Visual discomfort, bilateral: Secondary | ICD-10-CM | POA: Diagnosis not present

## 2023-07-14 DIAGNOSIS — Z1231 Encounter for screening mammogram for malignant neoplasm of breast: Secondary | ICD-10-CM | POA: Diagnosis not present

## 2023-09-08 DIAGNOSIS — K219 Gastro-esophageal reflux disease without esophagitis: Secondary | ICD-10-CM | POA: Diagnosis not present

## 2023-09-08 DIAGNOSIS — G4700 Insomnia, unspecified: Secondary | ICD-10-CM | POA: Diagnosis not present

## 2023-09-08 DIAGNOSIS — E1169 Type 2 diabetes mellitus with other specified complication: Secondary | ICD-10-CM | POA: Diagnosis not present

## 2024-02-05 DIAGNOSIS — E7800 Pure hypercholesterolemia, unspecified: Secondary | ICD-10-CM | POA: Diagnosis not present

## 2024-02-05 DIAGNOSIS — E1169 Type 2 diabetes mellitus with other specified complication: Secondary | ICD-10-CM | POA: Diagnosis not present

## 2024-03-07 DIAGNOSIS — E7800 Pure hypercholesterolemia, unspecified: Secondary | ICD-10-CM | POA: Diagnosis not present

## 2024-03-07 DIAGNOSIS — E1169 Type 2 diabetes mellitus with other specified complication: Secondary | ICD-10-CM | POA: Diagnosis not present

## 2024-03-20 DIAGNOSIS — E7800 Pure hypercholesterolemia, unspecified: Secondary | ICD-10-CM | POA: Diagnosis not present

## 2024-03-20 DIAGNOSIS — G4700 Insomnia, unspecified: Secondary | ICD-10-CM | POA: Diagnosis not present

## 2024-03-20 DIAGNOSIS — M8580 Other specified disorders of bone density and structure, unspecified site: Secondary | ICD-10-CM | POA: Diagnosis not present

## 2024-03-20 DIAGNOSIS — Z23 Encounter for immunization: Secondary | ICD-10-CM | POA: Diagnosis not present

## 2024-03-20 DIAGNOSIS — E1169 Type 2 diabetes mellitus with other specified complication: Secondary | ICD-10-CM | POA: Diagnosis not present

## 2024-03-20 DIAGNOSIS — Z5181 Encounter for therapeutic drug level monitoring: Secondary | ICD-10-CM | POA: Diagnosis not present

## 2024-03-20 DIAGNOSIS — K219 Gastro-esophageal reflux disease without esophagitis: Secondary | ICD-10-CM | POA: Diagnosis not present

## 2024-03-20 DIAGNOSIS — E611 Iron deficiency: Secondary | ICD-10-CM | POA: Diagnosis not present

## 2024-03-20 DIAGNOSIS — E559 Vitamin D deficiency, unspecified: Secondary | ICD-10-CM | POA: Diagnosis not present

## 2024-03-20 DIAGNOSIS — Z0000 Encounter for general adult medical examination without abnormal findings: Secondary | ICD-10-CM | POA: Diagnosis not present

## 2024-04-07 DIAGNOSIS — E1169 Type 2 diabetes mellitus with other specified complication: Secondary | ICD-10-CM | POA: Diagnosis not present

## 2024-04-07 DIAGNOSIS — E7800 Pure hypercholesterolemia, unspecified: Secondary | ICD-10-CM | POA: Diagnosis not present

## 2024-04-26 DIAGNOSIS — M7742 Metatarsalgia, left foot: Secondary | ICD-10-CM | POA: Diagnosis not present

## 2024-04-29 DIAGNOSIS — E538 Deficiency of other specified B group vitamins: Secondary | ICD-10-CM | POA: Diagnosis not present

## 2024-05-07 DIAGNOSIS — E7800 Pure hypercholesterolemia, unspecified: Secondary | ICD-10-CM | POA: Diagnosis not present

## 2024-05-07 DIAGNOSIS — E1169 Type 2 diabetes mellitus with other specified complication: Secondary | ICD-10-CM | POA: Diagnosis not present

## 2024-05-28 DIAGNOSIS — C4441 Basal cell carcinoma of skin of scalp and neck: Secondary | ICD-10-CM | POA: Diagnosis not present

## 2024-05-28 DIAGNOSIS — Z85828 Personal history of other malignant neoplasm of skin: Secondary | ICD-10-CM | POA: Diagnosis not present

## 2024-05-28 DIAGNOSIS — L853 Xerosis cutis: Secondary | ICD-10-CM | POA: Diagnosis not present

## 2024-05-28 DIAGNOSIS — Z85820 Personal history of malignant melanoma of skin: Secondary | ICD-10-CM | POA: Diagnosis not present

## 2024-05-28 DIAGNOSIS — D485 Neoplasm of uncertain behavior of skin: Secondary | ICD-10-CM | POA: Diagnosis not present

## 2024-05-28 DIAGNOSIS — L578 Other skin changes due to chronic exposure to nonionizing radiation: Secondary | ICD-10-CM | POA: Diagnosis not present

## 2024-05-28 DIAGNOSIS — L821 Other seborrheic keratosis: Secondary | ICD-10-CM | POA: Diagnosis not present

## 2024-05-28 DIAGNOSIS — L570 Actinic keratosis: Secondary | ICD-10-CM | POA: Diagnosis not present

## 2024-05-28 DIAGNOSIS — L304 Erythema intertrigo: Secondary | ICD-10-CM | POA: Diagnosis not present

## 2024-05-28 DIAGNOSIS — L814 Other melanin hyperpigmentation: Secondary | ICD-10-CM | POA: Diagnosis not present

## 2024-05-28 NOTE — Progress Notes (Addendum)
 Traci Hudson                                          MRN: [id number]   07/01/2024   The [facility] Quality Team Specialist reviewed this patient medical record for the purposes of chart review for care gap closure. The following were reviewed: chart review for care gap closure-diabetic eye exam.    [facility] Quality Team

## 2024-05-30 DIAGNOSIS — Z23 Encounter for immunization: Secondary | ICD-10-CM | POA: Diagnosis not present

## 2024-05-30 DIAGNOSIS — J988 Other specified respiratory disorders: Secondary | ICD-10-CM | POA: Diagnosis not present

## 2024-05-30 DIAGNOSIS — J4530 Mild persistent asthma, uncomplicated: Secondary | ICD-10-CM | POA: Diagnosis not present
